# Patient Record
Sex: Female | Born: 1960 | Race: Black or African American | Hispanic: No | Marital: Single | Smoking: Former smoker
Health system: Southern US, Community
[De-identification: ages and names within clinical notes are randomized; demographics above are authoritative.]

## PROBLEM LIST (undated history)

## (undated) DIAGNOSIS — J449 Chronic obstructive pulmonary disease, unspecified: Secondary | ICD-10-CM

## (undated) DIAGNOSIS — M797 Fibromyalgia: Secondary | ICD-10-CM

## (undated) DIAGNOSIS — I10 Essential (primary) hypertension: Secondary | ICD-10-CM

## (undated) DIAGNOSIS — K219 Gastro-esophageal reflux disease without esophagitis: Secondary | ICD-10-CM

## (undated) DIAGNOSIS — M1990 Unspecified osteoarthritis, unspecified site: Secondary | ICD-10-CM

## (undated) DIAGNOSIS — M549 Dorsalgia, unspecified: Secondary | ICD-10-CM

## (undated) DIAGNOSIS — G8929 Other chronic pain: Secondary | ICD-10-CM

## (undated) DIAGNOSIS — G4730 Sleep apnea, unspecified: Secondary | ICD-10-CM

## (undated) DIAGNOSIS — F419 Anxiety disorder, unspecified: Secondary | ICD-10-CM

## (undated) HISTORY — PX: CARPAL TUNNEL RELEASE: SHX101

## (undated) HISTORY — PX: ABDOMINAL HYSTERECTOMY: SHX81

## (undated) HISTORY — PX: KNEE SURGERY: SHX244

## (undated) HISTORY — PX: FOOT SURGERY: SHX648

## (undated) HISTORY — PX: GANGLION CYST EXCISION: SHX1691

---

## 2000-10-04 ENCOUNTER — Ambulatory Visit (HOSPITAL_COMMUNITY): Admission: RE | Admit: 2000-10-04 | Discharge: 2000-10-04 | Payer: Self-pay

## 2000-10-31 ENCOUNTER — Encounter (HOSPITAL_COMMUNITY): Admission: RE | Admit: 2000-10-31 | Discharge: 2000-11-30 | Payer: Self-pay | Admitting: Orthopedic Surgery

## 2000-11-08 ENCOUNTER — Emergency Department (HOSPITAL_COMMUNITY): Admission: EM | Admit: 2000-11-08 | Discharge: 2000-11-08 | Payer: Self-pay | Admitting: Emergency Medicine

## 2000-12-19 ENCOUNTER — Emergency Department (HOSPITAL_COMMUNITY): Admission: EM | Admit: 2000-12-19 | Discharge: 2000-12-20 | Payer: Self-pay | Admitting: Internal Medicine

## 2002-12-09 ENCOUNTER — Emergency Department (HOSPITAL_COMMUNITY): Admission: EM | Admit: 2002-12-09 | Discharge: 2002-12-09 | Payer: Self-pay | Admitting: Emergency Medicine

## 2003-02-22 ENCOUNTER — Emergency Department (HOSPITAL_COMMUNITY): Admission: EM | Admit: 2003-02-22 | Discharge: 2003-02-22 | Payer: Self-pay | Admitting: *Deleted

## 2003-05-19 ENCOUNTER — Ambulatory Visit (HOSPITAL_COMMUNITY): Admission: RE | Admit: 2003-05-19 | Discharge: 2003-05-19 | Payer: Self-pay | Admitting: Family Medicine

## 2003-08-21 ENCOUNTER — Emergency Department (HOSPITAL_COMMUNITY): Admission: EM | Admit: 2003-08-21 | Discharge: 2003-08-21 | Payer: Self-pay | Admitting: Emergency Medicine

## 2004-06-11 ENCOUNTER — Emergency Department (HOSPITAL_COMMUNITY): Admission: EM | Admit: 2004-06-11 | Discharge: 2004-06-12 | Payer: Self-pay | Admitting: *Deleted

## 2004-06-15 ENCOUNTER — Emergency Department (HOSPITAL_COMMUNITY): Admission: EM | Admit: 2004-06-15 | Discharge: 2004-06-15 | Payer: Self-pay | Admitting: Emergency Medicine

## 2004-09-09 ENCOUNTER — Ambulatory Visit (HOSPITAL_COMMUNITY): Admission: RE | Admit: 2004-09-09 | Discharge: 2004-09-09 | Payer: Self-pay | Admitting: Family Medicine

## 2004-10-31 ENCOUNTER — Emergency Department (HOSPITAL_COMMUNITY): Admission: EM | Admit: 2004-10-31 | Discharge: 2004-10-31 | Payer: Self-pay | Admitting: Emergency Medicine

## 2004-12-31 ENCOUNTER — Emergency Department (HOSPITAL_COMMUNITY): Admission: EM | Admit: 2004-12-31 | Discharge: 2004-12-31 | Payer: Self-pay | Admitting: Emergency Medicine

## 2005-03-26 ENCOUNTER — Emergency Department (HOSPITAL_COMMUNITY): Admission: EM | Admit: 2005-03-26 | Discharge: 2005-03-26 | Payer: Self-pay | Admitting: Emergency Medicine

## 2005-03-26 ENCOUNTER — Emergency Department (HOSPITAL_COMMUNITY): Admission: EM | Admit: 2005-03-26 | Discharge: 2005-03-27 | Payer: Self-pay | Admitting: Emergency Medicine

## 2005-03-29 ENCOUNTER — Emergency Department (HOSPITAL_COMMUNITY): Admission: EM | Admit: 2005-03-29 | Discharge: 2005-03-29 | Payer: Self-pay | Admitting: Emergency Medicine

## 2005-05-22 ENCOUNTER — Emergency Department (HOSPITAL_COMMUNITY): Admission: EM | Admit: 2005-05-22 | Discharge: 2005-05-22 | Payer: Self-pay | Admitting: Emergency Medicine

## 2005-06-23 ENCOUNTER — Ambulatory Visit: Payer: Self-pay | Admitting: Family Medicine

## 2005-07-03 ENCOUNTER — Ambulatory Visit (HOSPITAL_COMMUNITY): Admission: RE | Admit: 2005-07-03 | Discharge: 2005-07-03 | Payer: Self-pay | Admitting: Unknown Physician Specialty

## 2005-07-10 ENCOUNTER — Encounter (INDEPENDENT_AMBULATORY_CARE_PROVIDER_SITE_OTHER): Payer: Self-pay | Admitting: Internal Medicine

## 2005-07-10 LAB — CONVERTED CEMR LAB: TSH: 0.928 microintl units/mL

## 2005-07-13 ENCOUNTER — Ambulatory Visit: Payer: Self-pay | Admitting: Family Medicine

## 2005-07-18 ENCOUNTER — Encounter (HOSPITAL_COMMUNITY): Admission: RE | Admit: 2005-07-18 | Discharge: 2005-08-17 | Payer: Self-pay | Admitting: Family Medicine

## 2005-07-21 ENCOUNTER — Ambulatory Visit: Payer: Self-pay | Admitting: Family Medicine

## 2005-07-21 ENCOUNTER — Encounter (INDEPENDENT_AMBULATORY_CARE_PROVIDER_SITE_OTHER): Payer: Self-pay | Admitting: Internal Medicine

## 2005-07-21 ENCOUNTER — Emergency Department (HOSPITAL_COMMUNITY): Admission: EM | Admit: 2005-07-21 | Discharge: 2005-07-21 | Payer: Self-pay | Admitting: Emergency Medicine

## 2005-07-21 LAB — CONVERTED CEMR LAB
RBC count: 5.63 10*6/uL
WBC, blood: 5.8 10*3/uL

## 2005-07-25 ENCOUNTER — Ambulatory Visit: Payer: Self-pay | Admitting: Family Medicine

## 2005-08-01 ENCOUNTER — Emergency Department (HOSPITAL_COMMUNITY): Admission: EM | Admit: 2005-08-01 | Discharge: 2005-08-01 | Payer: Self-pay | Admitting: Emergency Medicine

## 2005-08-11 ENCOUNTER — Ambulatory Visit: Payer: Self-pay | Admitting: Family Medicine

## 2005-08-14 ENCOUNTER — Ambulatory Visit: Payer: Self-pay | Admitting: Orthopedic Surgery

## 2005-08-17 ENCOUNTER — Encounter (INDEPENDENT_AMBULATORY_CARE_PROVIDER_SITE_OTHER): Payer: Self-pay | Admitting: Internal Medicine

## 2005-08-23 ENCOUNTER — Encounter (HOSPITAL_COMMUNITY): Admission: RE | Admit: 2005-08-23 | Discharge: 2005-09-22 | Payer: Self-pay | Admitting: Family Medicine

## 2005-08-24 ENCOUNTER — Ambulatory Visit (HOSPITAL_COMMUNITY): Admission: RE | Admit: 2005-08-24 | Discharge: 2005-08-24 | Payer: Self-pay | Admitting: Orthopedic Surgery

## 2005-08-30 ENCOUNTER — Ambulatory Visit: Payer: Self-pay | Admitting: Family Medicine

## 2005-09-11 ENCOUNTER — Ambulatory Visit: Payer: Self-pay | Admitting: Orthopedic Surgery

## 2005-09-13 ENCOUNTER — Ambulatory Visit: Payer: Self-pay | Admitting: Family Medicine

## 2005-09-25 ENCOUNTER — Encounter (HOSPITAL_COMMUNITY): Admission: RE | Admit: 2005-09-25 | Discharge: 2005-10-25 | Payer: Self-pay | Admitting: Family Medicine

## 2005-09-26 ENCOUNTER — Emergency Department (HOSPITAL_COMMUNITY): Admission: EM | Admit: 2005-09-26 | Discharge: 2005-09-26 | Payer: Self-pay | Admitting: Emergency Medicine

## 2005-09-27 ENCOUNTER — Encounter: Admission: RE | Admit: 2005-09-27 | Discharge: 2005-09-27 | Payer: Self-pay | Admitting: Orthopedic Surgery

## 2005-10-03 ENCOUNTER — Ambulatory Visit: Payer: Self-pay | Admitting: Internal Medicine

## 2005-10-03 LAB — CONVERTED CEMR LAB: aPTT: 30 s

## 2005-10-11 ENCOUNTER — Encounter: Admission: RE | Admit: 2005-10-11 | Discharge: 2005-10-11 | Payer: Self-pay | Admitting: Orthopedic Surgery

## 2005-11-01 ENCOUNTER — Ambulatory Visit: Payer: Self-pay | Admitting: Orthopedic Surgery

## 2005-11-10 ENCOUNTER — Ambulatory Visit (HOSPITAL_COMMUNITY): Admission: RE | Admit: 2005-11-10 | Discharge: 2005-11-10 | Payer: Self-pay | Admitting: Neurosurgery

## 2005-11-21 ENCOUNTER — Ambulatory Visit: Payer: Self-pay | Admitting: Internal Medicine

## 2005-12-05 ENCOUNTER — Ambulatory Visit: Payer: Self-pay | Admitting: Internal Medicine

## 2005-12-05 ENCOUNTER — Other Ambulatory Visit: Admission: RE | Admit: 2005-12-05 | Discharge: 2005-12-05 | Payer: Self-pay | Admitting: Internal Medicine

## 2005-12-11 ENCOUNTER — Ambulatory Visit (HOSPITAL_COMMUNITY): Admission: RE | Admit: 2005-12-11 | Discharge: 2005-12-11 | Payer: Self-pay | Admitting: Internal Medicine

## 2006-01-19 ENCOUNTER — Ambulatory Visit (HOSPITAL_COMMUNITY): Admission: RE | Admit: 2006-01-19 | Discharge: 2006-01-19 | Payer: Self-pay | Admitting: *Deleted

## 2006-01-19 ENCOUNTER — Ambulatory Visit: Payer: Self-pay | Admitting: Internal Medicine

## 2006-01-24 ENCOUNTER — Ambulatory Visit (HOSPITAL_COMMUNITY): Admission: RE | Admit: 2006-01-24 | Discharge: 2006-01-24 | Payer: Self-pay | Admitting: Internal Medicine

## 2006-02-08 ENCOUNTER — Ambulatory Visit: Payer: Self-pay | Admitting: Orthopedic Surgery

## 2006-02-13 ENCOUNTER — Encounter (HOSPITAL_COMMUNITY): Admission: RE | Admit: 2006-02-13 | Discharge: 2006-03-15 | Payer: Self-pay | Admitting: Orthopedic Surgery

## 2006-04-20 ENCOUNTER — Ambulatory Visit: Payer: Self-pay | Admitting: Internal Medicine

## 2006-06-27 ENCOUNTER — Emergency Department (HOSPITAL_COMMUNITY): Admission: EM | Admit: 2006-06-27 | Discharge: 2006-06-27 | Payer: Self-pay | Admitting: Emergency Medicine

## 2006-07-04 ENCOUNTER — Ambulatory Visit: Payer: Self-pay | Admitting: Internal Medicine

## 2006-07-06 ENCOUNTER — Encounter: Payer: Self-pay | Admitting: Internal Medicine

## 2006-07-06 DIAGNOSIS — K589 Irritable bowel syndrome without diarrhea: Secondary | ICD-10-CM | POA: Insufficient documentation

## 2006-07-06 DIAGNOSIS — K219 Gastro-esophageal reflux disease without esophagitis: Secondary | ICD-10-CM | POA: Insufficient documentation

## 2006-07-06 DIAGNOSIS — F329 Major depressive disorder, single episode, unspecified: Secondary | ICD-10-CM

## 2006-07-06 DIAGNOSIS — G43009 Migraine without aura, not intractable, without status migrainosus: Secondary | ICD-10-CM | POA: Insufficient documentation

## 2006-07-06 DIAGNOSIS — N949 Unspecified condition associated with female genital organs and menstrual cycle: Secondary | ICD-10-CM

## 2006-07-06 DIAGNOSIS — M545 Low back pain, unspecified: Secondary | ICD-10-CM | POA: Insufficient documentation

## 2006-07-06 DIAGNOSIS — D649 Anemia, unspecified: Secondary | ICD-10-CM

## 2006-07-06 DIAGNOSIS — N390 Urinary tract infection, site not specified: Secondary | ICD-10-CM

## 2006-07-06 DIAGNOSIS — I10 Essential (primary) hypertension: Secondary | ICD-10-CM | POA: Insufficient documentation

## 2006-07-24 ENCOUNTER — Encounter (HOSPITAL_COMMUNITY): Admission: RE | Admit: 2006-07-24 | Discharge: 2006-08-23 | Payer: Self-pay

## 2006-08-28 ENCOUNTER — Encounter (HOSPITAL_COMMUNITY): Admission: RE | Admit: 2006-08-28 | Discharge: 2006-09-27 | Payer: Self-pay

## 2006-10-08 ENCOUNTER — Encounter (HOSPITAL_COMMUNITY): Admission: RE | Admit: 2006-10-08 | Discharge: 2006-11-09 | Payer: Self-pay | Admitting: Orthopedic Surgery

## 2006-11-15 ENCOUNTER — Emergency Department (HOSPITAL_COMMUNITY): Admission: EM | Admit: 2006-11-15 | Discharge: 2006-11-15 | Payer: Self-pay | Admitting: Emergency Medicine

## 2007-01-29 ENCOUNTER — Encounter (INDEPENDENT_AMBULATORY_CARE_PROVIDER_SITE_OTHER): Payer: Self-pay | Admitting: Internal Medicine

## 2007-06-20 ENCOUNTER — Encounter: Payer: Self-pay | Admitting: Family Medicine

## 2008-06-18 ENCOUNTER — Emergency Department (HOSPITAL_COMMUNITY): Admission: EM | Admit: 2008-06-18 | Discharge: 2008-06-18 | Payer: Self-pay | Admitting: Emergency Medicine

## 2008-08-14 ENCOUNTER — Telehealth: Payer: Self-pay | Admitting: Orthopedic Surgery

## 2008-09-25 ENCOUNTER — Encounter: Payer: Self-pay | Admitting: Orthopedic Surgery

## 2008-10-25 ENCOUNTER — Encounter: Payer: Self-pay | Admitting: Orthopedic Surgery

## 2008-11-09 ENCOUNTER — Emergency Department (HOSPITAL_COMMUNITY): Admission: EM | Admit: 2008-11-09 | Discharge: 2008-11-09 | Payer: Self-pay | Admitting: Emergency Medicine

## 2009-01-07 ENCOUNTER — Emergency Department (HOSPITAL_COMMUNITY): Admission: EM | Admit: 2009-01-07 | Discharge: 2009-01-07 | Payer: Self-pay | Admitting: Emergency Medicine

## 2009-03-01 ENCOUNTER — Emergency Department (HOSPITAL_COMMUNITY): Admission: EM | Admit: 2009-03-01 | Discharge: 2009-03-01 | Payer: Self-pay | Admitting: Emergency Medicine

## 2009-06-21 ENCOUNTER — Encounter (HOSPITAL_COMMUNITY): Admission: RE | Admit: 2009-06-21 | Discharge: 2009-07-21 | Payer: Self-pay | Admitting: Pathology

## 2010-03-15 ENCOUNTER — Emergency Department (HOSPITAL_COMMUNITY): Admission: EM | Admit: 2010-03-15 | Discharge: 2010-03-16 | Payer: Self-pay | Admitting: Emergency Medicine

## 2010-07-10 ENCOUNTER — Encounter: Payer: Self-pay | Admitting: Orthopedic Surgery

## 2010-07-19 NOTE — Letter (Signed)
Summary: Historic Patient File  Historic Patient File   Imported By: Lind Guest 03/16/2010 11:24:37  _____________________________________________________________________  External Attachment:    Type:   Image     Comment:   External Document

## 2010-09-01 LAB — WET PREP, GENITAL: Yeast Wet Prep HPF POC: NONE SEEN

## 2010-09-01 LAB — URINALYSIS, ROUTINE W REFLEX MICROSCOPIC
Glucose, UA: NEGATIVE mg/dL
Ketones, ur: NEGATIVE mg/dL
Nitrite: NEGATIVE
Protein, ur: NEGATIVE mg/dL
Urobilinogen, UA: 0.2 mg/dL (ref 0.0–1.0)

## 2010-09-25 LAB — GC/CHLAMYDIA PROBE AMP, GENITAL
Chlamydia, DNA Probe: NEGATIVE
GC Probe Amp, Genital: NEGATIVE

## 2010-09-25 LAB — URINALYSIS, ROUTINE W REFLEX MICROSCOPIC
Bilirubin Urine: NEGATIVE
Glucose, UA: NEGATIVE mg/dL
Ketones, ur: NEGATIVE mg/dL
Specific Gravity, Urine: 1.02 (ref 1.005–1.030)
pH: 6.5 (ref 5.0–8.0)

## 2010-09-25 LAB — URINE MICROSCOPIC-ADD ON

## 2010-09-25 LAB — URINE CULTURE: Colony Count: NO GROWTH

## 2010-09-25 LAB — WET PREP, GENITAL

## 2012-09-20 ENCOUNTER — Emergency Department (HOSPITAL_COMMUNITY): Payer: Medicaid - Out of State

## 2012-09-20 ENCOUNTER — Encounter (HOSPITAL_COMMUNITY): Payer: Self-pay

## 2012-09-20 ENCOUNTER — Emergency Department (HOSPITAL_COMMUNITY)
Admission: EM | Admit: 2012-09-20 | Discharge: 2012-09-20 | Disposition: A | Discharge status: Home / Self Care | Payer: Medicaid - Out of State | Attending: Emergency Medicine | Admitting: Emergency Medicine

## 2012-09-20 DIAGNOSIS — M542 Cervicalgia: Secondary | ICD-10-CM

## 2012-09-20 DIAGNOSIS — Z8739 Personal history of other diseases of the musculoskeletal system and connective tissue: Secondary | ICD-10-CM | POA: Insufficient documentation

## 2012-09-20 DIAGNOSIS — M25519 Pain in unspecified shoulder: Secondary | ICD-10-CM | POA: Insufficient documentation

## 2012-09-20 DIAGNOSIS — S0990XA Unspecified injury of head, initial encounter: Secondary | ICD-10-CM | POA: Insufficient documentation

## 2012-09-20 DIAGNOSIS — S39012A Strain of muscle, fascia and tendon of lower back, initial encounter: Secondary | ICD-10-CM

## 2012-09-20 DIAGNOSIS — I10 Essential (primary) hypertension: Secondary | ICD-10-CM | POA: Insufficient documentation

## 2012-09-20 DIAGNOSIS — K219 Gastro-esophageal reflux disease without esophagitis: Secondary | ICD-10-CM | POA: Insufficient documentation

## 2012-09-20 DIAGNOSIS — S139XXA Sprain of joints and ligaments of unspecified parts of neck, initial encounter: Secondary | ICD-10-CM | POA: Insufficient documentation

## 2012-09-20 DIAGNOSIS — R51 Headache: Secondary | ICD-10-CM

## 2012-09-20 DIAGNOSIS — S335XXA Sprain of ligaments of lumbar spine, initial encounter: Secondary | ICD-10-CM | POA: Insufficient documentation

## 2012-09-20 DIAGNOSIS — G8929 Other chronic pain: Secondary | ICD-10-CM

## 2012-09-20 DIAGNOSIS — S0993XA Unspecified injury of face, initial encounter: Secondary | ICD-10-CM | POA: Insufficient documentation

## 2012-09-20 DIAGNOSIS — S161XXA Strain of muscle, fascia and tendon at neck level, initial encounter: Secondary | ICD-10-CM

## 2012-09-20 HISTORY — DX: Essential (primary) hypertension: I10

## 2012-09-20 HISTORY — DX: Unspecified osteoarthritis, unspecified site: M19.90

## 2012-09-20 HISTORY — DX: Gastro-esophageal reflux disease without esophagitis: K21.9

## 2012-09-20 MED ORDER — HYDROCODONE-ACETAMINOPHEN 5-325 MG PO TABS
2.0000 | ORAL_TABLET | Freq: Once | ORAL | Status: AC
  Administered 2012-09-20: 2 via ORAL
  Filled 2012-09-20: qty 2

## 2012-09-20 MED ORDER — DIAZEPAM 5 MG PO TABS
10.0000 mg | ORAL_TABLET | Freq: Once | ORAL | Status: AC
  Administered 2012-09-20: 10 mg via ORAL
  Filled 2012-09-20: qty 2

## 2012-09-20 MED ORDER — HYDROCODONE-ACETAMINOPHEN 5-325 MG PO TABS: 1.0000 | ORAL_TABLET | ORAL | Status: DC | PRN

## 2012-09-20 MED ORDER — METHOCARBAMOL 500 MG PO TABS: 500.0000 mg | ORAL_TABLET | Freq: Two times a day (BID) | ORAL | Status: DC

## 2012-09-20 MED ORDER — NAPROXEN 500 MG PO TABS: 500.0000 mg | ORAL_TABLET | Freq: Two times a day (BID) | ORAL | Status: DC
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
## 2012-09-20 NOTE — ED Notes (Signed)
Pt ambulated with assistance of cane (per norm at home.)

## 2012-09-20 NOTE — ED Notes (Signed)
PA removed C collar
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
## 2012-09-21 NOTE — ED Provider Notes (Signed)
  Medical screening examination/treatment/procedure(s) were performed by non-physician practitioner and as supervising physician I was immediately available for consultation/collaboration.    Gerhard Munch, MD 09/21/12 0030

## 2012-10-24 ENCOUNTER — Emergency Department (HOSPITAL_COMMUNITY)
Admission: EM | Admit: 2012-10-24 | Discharge: 2012-10-24 | Disposition: A | Discharge status: Home / Self Care | Payer: Medicaid - Out of State | Attending: Emergency Medicine | Admitting: Emergency Medicine

## 2012-10-24 ENCOUNTER — Encounter (HOSPITAL_COMMUNITY): Payer: Self-pay

## 2012-10-24 DIAGNOSIS — J4489 Other specified chronic obstructive pulmonary disease: Secondary | ICD-10-CM | POA: Insufficient documentation

## 2012-10-24 DIAGNOSIS — S61409A Unspecified open wound of unspecified hand, initial encounter: Secondary | ICD-10-CM | POA: Insufficient documentation

## 2012-10-24 DIAGNOSIS — I10 Essential (primary) hypertension: Secondary | ICD-10-CM | POA: Insufficient documentation

## 2012-10-24 DIAGNOSIS — Y929 Unspecified place or not applicable: Secondary | ICD-10-CM | POA: Insufficient documentation

## 2012-10-24 DIAGNOSIS — Z8739 Personal history of other diseases of the musculoskeletal system and connective tissue: Secondary | ICD-10-CM | POA: Insufficient documentation

## 2012-10-24 DIAGNOSIS — W278XXA Contact with other nonpowered hand tool, initial encounter: Secondary | ICD-10-CM | POA: Insufficient documentation

## 2012-10-24 DIAGNOSIS — T148XXA Other injury of unspecified body region, initial encounter: Secondary | ICD-10-CM

## 2012-10-24 DIAGNOSIS — F17200 Nicotine dependence, unspecified, uncomplicated: Secondary | ICD-10-CM | POA: Insufficient documentation

## 2012-10-24 DIAGNOSIS — Y939 Activity, unspecified: Secondary | ICD-10-CM | POA: Insufficient documentation

## 2012-10-24 DIAGNOSIS — Z79899 Other long term (current) drug therapy: Secondary | ICD-10-CM | POA: Insufficient documentation

## 2012-10-24 DIAGNOSIS — J449 Chronic obstructive pulmonary disease, unspecified: Secondary | ICD-10-CM | POA: Insufficient documentation

## 2012-10-24 DIAGNOSIS — Z8719 Personal history of other diseases of the digestive system: Secondary | ICD-10-CM | POA: Insufficient documentation

## 2012-10-24 HISTORY — DX: Chronic obstructive pulmonary disease, unspecified: J44.9

## 2012-10-24 MED ORDER — IBUPROFEN 800 MG PO TABS
800.0000 mg | ORAL_TABLET | Freq: Once | ORAL | Status: AC
  Administered 2012-10-24: 800 mg via ORAL
  Filled 2012-10-24: qty 1

## 2012-10-24 MED ORDER — AMOXICILLIN-POT CLAVULANATE 500-125 MG PO TABS
1.0000 | ORAL_TABLET | Freq: Once | ORAL | Status: AC
  Administered 2012-10-24: 500 mg via ORAL
  Filled 2012-10-24: qty 1

## 2012-10-24 MED ORDER — BACITRACIN ZINC 500 UNIT/GM EX OINT
TOPICAL_OINTMENT | CUTANEOUS | Status: AC
  Filled 2012-10-24: qty 0.9

## 2012-10-24 MED ORDER — AMOXICILLIN-POT CLAVULANATE 500-125 MG PO TABS: 1.0000 | ORAL_TABLET | Freq: Three times a day (TID) | ORAL | Status: DC

## 2012-10-24 NOTE — ED Notes (Signed)
Pt has 2 puncture wounds to left arm and hand, states she was outside and she stepped on a hoe that was lying on the ground and states it sprung up and caught her in the left arm and hand.

## 2012-10-24 NOTE — ED Provider Notes (Signed)
History     CSN: [id number]  Arrival date & time 10/24/12  0050   First MD Initiated Contact with Patient 10/24/12 0224      Chief Complaint  Patient presents with  . Extremity Laceration    (Consider location/radiation/quality/duration/timing/severity/associated sxs/prior treatment) HPI HPI Comments: Vicki Dean is a 52 y.o. female who presents to the Emergency Department complaining of two puncture wounds to her left hand sustained as she stepped on a hoe that was lying on the ground and it came up hitting her in the left hand and arm. She has two lacerations, one in the base of her thumb and one on her left arm.   Past Medical History  Diagnosis Date  . Hypertension   . GERD (gastroesophageal reflux disease)   . Arthritis     possibly her right shoulder.  Marland Kitchen COPD (chronic obstructive pulmonary disease)     History reviewed. No pertinent past surgical history.  No family history on file.  History  Substance Use Topics  . Smoking status: Current Some Day Smoker  . Smokeless tobacco: Not on file  . Alcohol Use: Yes    OB History   Grav Para Term Preterm Abortions TAB SAB Ect Mult Living                  Review of Systems  Constitutional: Negative for fever.       10 Systems reviewed and are negative for acute change except as noted in the HPI.  HENT: Negative for congestion.   Eyes: Negative for discharge and redness.  Respiratory: Negative for cough and shortness of breath.   Cardiovascular: Negative for chest pain.  Gastrointestinal: Negative for vomiting and abdominal pain.  Musculoskeletal: Negative for back pain.  Skin: Negative for rash.       Two puncture wounds, one to base of thumb, one to forearm  Neurological: Negative for syncope, numbness and headaches.  Psychiatric/Behavioral:       No behavior change.    Allergies  Review of patient's allergies indicates not on file.  Home Medications   Current Outpatient Rx  Name  Route  Sig  Dispense   Refill  . cyclobenzaprine (FLEXERIL) 10 MG tablet   Oral   Take 10 mg by mouth 3 (three) times daily as needed for muscle spasms.         . methocarbamol (ROBAXIN) 500 MG tablet   Oral   Take 1 tablet (500 mg total) by mouth 2 (two) times daily.   20 tablet   0   . metoprolol-hydrochlorothiazide (LOPRESSOR HCT) 50-25 MG per tablet   Oral   Take 1 tablet by mouth daily.         . naproxen (NAPROSYN) 500 MG tablet   Oral   Take 1 tablet (500 mg total) by mouth 2 (two) times daily with a meal.   30 tablet   0   . trazodone (DESYREL) 300 MG tablet   Oral   Take 300 mg by mouth at bedtime.         Marland Kitchen HYDROcodone-acetaminophen (NORCO/VICODIN) 5-325 MG per tablet   Oral   Take 1-2 tablets by mouth every 4 (four) hours as needed for pain.   20 tablet   0     BP 136/82  Pulse 102  Temp(Src) 97.8 F (36.6 C) (Oral)  Resp 20  Ht 5\' 7"  (1.702 m)  Wt 188 lb (85.276 kg)  BMI 29.44 kg/m2  SpO2 96%  Physical  Exam  Nursing note and vitals reviewed. Constitutional: She appears well-developed and well-nourished.  Awake, alert, nontoxic appearance.  HENT:  Head: Normocephalic and atraumatic.  Eyes: EOM are normal. Pupils are equal, round, and reactive to light.  Neck: Neck supple.  Cardiovascular: Normal rate and intact distal pulses.   Pulmonary/Chest: Effort normal and breath sounds normal. She exhibits no tenderness.  Abdominal: Soft. Bowel sounds are normal. There is no tenderness. There is no rebound.  Musculoskeletal: She exhibits no tenderness.  Baseline ROM, no obvious new focal weakness.  Neurological:  Mental status and motor strength appears baseline for patient and situation.  Skin: No rash noted.  Puncture wound to base of left thumb. Small laceration to left forearm. Neither one can be sutured.  Psychiatric: She has a normal mood and affect.    ED Course  Procedures (including critical care time)  Medications  ibuprofen (ADVIL,MOTRIN) tablet 800 mg  (not administered)  amoxicillin-clavulanate (AUGMENTIN) 500-125 MG per tablet 500 mg (not administered)  bacitracin 500 UNIT/GM ointment (not administered)     MDM  Patient with two puncture wounds to left hand and arm from a hoe she stepped on while outside. Wounds have been cleaned and dressed. Pt stable in ED with no significant deterioration in condition.The patient appears reasonably screened and/or stabilized for discharge and I doubt any other medical condition or other [facility] requiring further screening, evaluation, or treatment in the ED at this time prior to discharge.  MDM Reviewed: nursing note and vitals           Nicoletta Dress. Colon Branch, MD 10/24/12 [phone]

## 2013-04-28 ENCOUNTER — Telehealth: Payer: Self-pay | Admitting: Orthopedic Surgery

## 2013-04-28 NOTE — Telephone Encounter (Signed)
Patient called to request appointment for knee problem; last seen here by Dr. Romeo Apple 02/13/06.  Forms have been completed for patient per authorization requests after this date for disability determination, however, no other recent visits on file.  Patient relates that she has been treating with [employer] for the knee problem, and has requested to see a doctor outside the [place] system, and said has the voucher authorizing the visit.  I relayed process of requesting medical records and films, with the appropriate release form, to be released to herself, so that she has the copy to bring to the office for Dr. Romeo Apple to review and advise.  Her ph# is [phone].  States she will contact them accordingly.

## 2013-04-30 NOTE — Telephone Encounter (Signed)
Patient will contact our office regarding records upon receipt for Dr. Romeo Apple to review.

## 2017-02-12 ENCOUNTER — Emergency Department (HOSPITAL_COMMUNITY): Payer: Non-veteran care

## 2017-02-12 ENCOUNTER — Emergency Department (HOSPITAL_COMMUNITY)
Admission: EM | Admit: 2017-02-12 | Discharge: 2017-02-12 | Disposition: A | Discharge status: Home / Self Care | Payer: Non-veteran care | Attending: Emergency Medicine | Admitting: Emergency Medicine

## 2017-02-12 ENCOUNTER — Encounter (HOSPITAL_COMMUNITY): Payer: Self-pay | Admitting: *Deleted

## 2017-02-12 DIAGNOSIS — Y929 Unspecified place or not applicable: Secondary | ICD-10-CM | POA: Insufficient documentation

## 2017-02-12 DIAGNOSIS — F17200 Nicotine dependence, unspecified, uncomplicated: Secondary | ICD-10-CM | POA: Diagnosis not present

## 2017-02-12 DIAGNOSIS — Y999 Unspecified external cause status: Secondary | ICD-10-CM | POA: Diagnosis not present

## 2017-02-12 DIAGNOSIS — W208XXA Other cause of strike by thrown, projected or falling object, initial encounter: Secondary | ICD-10-CM | POA: Diagnosis not present

## 2017-02-12 DIAGNOSIS — I10 Essential (primary) hypertension: Secondary | ICD-10-CM | POA: Diagnosis not present

## 2017-02-12 DIAGNOSIS — Y9389 Activity, other specified: Secondary | ICD-10-CM | POA: Insufficient documentation

## 2017-02-12 DIAGNOSIS — S99921A Unspecified injury of right foot, initial encounter: Secondary | ICD-10-CM | POA: Diagnosis present

## 2017-02-12 DIAGNOSIS — J449 Chronic obstructive pulmonary disease, unspecified: Secondary | ICD-10-CM | POA: Insufficient documentation

## 2017-02-12 DIAGNOSIS — S92424A Nondisplaced fracture of distal phalanx of right great toe, initial encounter for closed fracture: Secondary | ICD-10-CM | POA: Insufficient documentation

## 2017-02-12 MED ORDER — IBUPROFEN 800 MG PO TABS: 800.0000 mg | ORAL_TABLET | Freq: Three times a day (TID) | ORAL | 0 refills | Status: DC

## 2017-02-12 MED ORDER — TRAMADOL HCL 50 MG PO TABS: 50.0000 mg | ORAL_TABLET | Freq: Four times a day (QID) | ORAL | 0 refills | Status: DC | PRN

## 2017-02-12 MED ORDER — OXYCODONE-ACETAMINOPHEN 5-325 MG PO TABS
1.0000 | ORAL_TABLET | Freq: Once | ORAL | Status: AC
  Administered 2017-02-12: 1 via ORAL
  Filled 2017-02-12: qty 1

## 2017-02-12 NOTE — ED Provider Notes (Signed)
[facility]-EMERGENCY DEPT Provider Note   CSN: [id number] Arrival date & time: 02/12/17  0039     History   Chief Complaint Chief Complaint  Patient presents with  . Toe Pain    HPI Vicki Dean is a 56 y.o. female.  Patient presents to the emergency department for evaluation of injury to right great toe. Patient reports that she either dropped her suitcase on her toe or ran it over with the suitcase around 10 or 11 today. She reports that the toe has been tender, swollen all day. It hurts when she tries to put weight on it.      Past Medical History:  Diagnosis Date  . Arthritis    possibly her right shoulder.  Marland Kitchen COPD (chronic obstructive pulmonary disease) (HCC)   . GERD (gastroesophageal reflux disease)   . Hypertension     Patient Active Problem List   Diagnosis Date Noted  . ANEMIA-NOS 07/06/2006  . DEPRESSION 07/06/2006  . COMMON MIGRAINE 07/06/2006  . HYPERTENSION 07/06/2006  . GERD 07/06/2006  . IRRITABLE BOWEL SYNDROME 07/06/2006  . INFECTION, URINARY TRACT NOS 07/06/2006  . DYSFUNCTIONAL UTERINE BLEEDING 07/06/2006  . LOW BACK PAIN 07/06/2006    Past Surgical History:  Procedure Laterality Date  . ABDOMINAL HYSTERECTOMY    . CARPAL TUNNEL RELEASE    . FOOT SURGERY    . GANGLION CYST EXCISION    . KNEE SURGERY      OB History    No data available       Home Medications    Prior to Admission medications   Medication Sig Start Date End Date Taking? Authorizing Provider  ibuprofen (ADVIL,MOTRIN) 800 MG tablet Take 1 tablet (800 mg total) by mouth 3 (three) times daily. 02/12/17   Gilda Crease, MD  traMADol (ULTRAM) 50 MG tablet Take 1 tablet (50 mg total) by mouth every 6 (six) hours as needed. 02/12/17   Gilda Crease, MD    Family History No family history on file.  Social History Social History  Substance Use Topics  . Smoking status: Current Some Day Smoker  . Smokeless tobacco: Never Used  . Alcohol use Yes      Allergies   Patient has no allergy information on record.   Review of Systems Review of Systems  Musculoskeletal:       Toe pain  Skin: Negative for wound.     Physical Exam Updated Vital Signs BP (!) 145/79   Pulse 66   Temp 98.2 F (36.8 C) (Oral)   Resp 16   Ht 5\' 7"  (1.702 m)   Wt 95.3 kg (210 lb)   SpO2 100%   BMI 32.89 kg/m   Physical Exam  Constitutional: She is oriented to person, place, and time. She appears well-developed and well-nourished.  HENT:  Head: Atraumatic.  Eyes: Pupils are equal, round, and reactive to light.  Pulmonary/Chest: Effort normal.  Musculoskeletal: Normal range of motion.       Right foot: There is tenderness (great toe) and swelling. There is normal capillary refill and no deformity.       Feet:  Neurological: She is alert and oriented to person, place, and time.  Skin: Ecchymosis noted. No laceration noted.     ED Treatments / Results  Labs (all labs ordered are listed, but only abnormal results are displayed) Labs Reviewed - No data to display  EKG  EKG Interpretation None       Radiology Dg Toe Great Right  Result Date: 02/12/2017 CLINICAL DATA:  Crush injury. Struck great toe against a suitcase today. Pain and bruising. EXAM: RIGHT GREAT TOE COMPARISON:  None. FINDINGS: Transverse nondisplaced fracture of the great toe distal phalanx. No intra-articular extension. No additional acute fracture. There is soft tissue edema about the digit. Postsurgical change of the fifth metatarsal and first tarsal metatarsal joint are partially included. IMPRESSION: Acute transverse nondisplaced great toe distal phalanx fracture. Electronically Signed   By: Rubye Oaks M.D.   On: 02/12/2017 01:16    Procedures Procedures (including critical care time)  Medications Ordered in ED Medications - No data to display   Initial Impression / Assessment and Plan / ED Course  I have reviewed the triage vital signs and the nursing  notes.  Pertinent labs & imaging results that were available during my care of the patient were reviewed by me and considered in my medical decision making (see chart for details).     Patient presents with injury to right great toe. X-ray shows fracture. Patient provided analgesia, postop splint, follow-up with orthopedics.  Final Clinical Impressions(s) / ED Diagnoses   Final diagnoses:  Closed nondisplaced fracture of distal phalanx of right great toe, initial encounter    New Prescriptions New Prescriptions   IBUPROFEN (ADVIL,MOTRIN) 800 MG TABLET    Take 1 tablet (800 mg total) by mouth 3 (three) times daily.   TRAMADOL (ULTRAM) 50 MG TABLET    Take 1 tablet (50 mg total) by mouth every 6 (six) hours as needed.     Gilda Crease, MD 02/12/17 [phone]

## 2017-02-12 NOTE — ED Triage Notes (Signed)
Pt hit right great toe against a suit case today, c/o pain to toe area, bruising noted,

## 2017-04-13 ENCOUNTER — Encounter: Payer: Self-pay | Admitting: Orthopaedic Surgery

## 2017-04-16 ENCOUNTER — Other Ambulatory Visit (HOSPITAL_COMMUNITY): Payer: Self-pay | Admitting: Nurse Practitioner

## 2017-04-16 DIAGNOSIS — Z1231 Encounter for screening mammogram for malignant neoplasm of breast: Secondary | ICD-10-CM

## 2017-04-23 ENCOUNTER — Ambulatory Visit (HOSPITAL_COMMUNITY): Payer: Non-veteran care

## 2017-05-08 ENCOUNTER — Ambulatory Visit: Payer: Non-veteran care | Admitting: Family Medicine

## 2017-06-07 ENCOUNTER — Encounter: Payer: Self-pay | Admitting: Internal Medicine

## 2017-06-28 ENCOUNTER — Emergency Department (HOSPITAL_COMMUNITY)
Admission: EM | Admit: 2017-06-28 | Discharge: 2017-06-28 | Disposition: A | Discharge status: Home / Self Care | Payer: Non-veteran care | Attending: Emergency Medicine | Admitting: Emergency Medicine

## 2017-06-28 ENCOUNTER — Encounter (HOSPITAL_COMMUNITY): Payer: Self-pay

## 2017-06-28 ENCOUNTER — Other Ambulatory Visit: Payer: Self-pay

## 2017-06-28 DIAGNOSIS — J449 Chronic obstructive pulmonary disease, unspecified: Secondary | ICD-10-CM | POA: Insufficient documentation

## 2017-06-28 DIAGNOSIS — Z202 Contact with and (suspected) exposure to infections with a predominantly sexual mode of transmission: Secondary | ICD-10-CM | POA: Diagnosis not present

## 2017-06-28 DIAGNOSIS — I10 Essential (primary) hypertension: Secondary | ICD-10-CM | POA: Insufficient documentation

## 2017-06-28 DIAGNOSIS — N898 Other specified noninflammatory disorders of vagina: Secondary | ICD-10-CM | POA: Insufficient documentation

## 2017-06-28 DIAGNOSIS — Z79899 Other long term (current) drug therapy: Secondary | ICD-10-CM | POA: Insufficient documentation

## 2017-06-28 DIAGNOSIS — F17200 Nicotine dependence, unspecified, uncomplicated: Secondary | ICD-10-CM | POA: Diagnosis not present

## 2017-06-28 HISTORY — DX: Other chronic pain: G89.29

## 2017-06-28 HISTORY — DX: Dorsalgia, unspecified: M54.9

## 2017-06-28 LAB — URINALYSIS, ROUTINE W REFLEX MICROSCOPIC
Bilirubin Urine: NEGATIVE
Glucose, UA: NEGATIVE mg/dL
Hgb urine dipstick: NEGATIVE
Ketones, ur: NEGATIVE mg/dL
Nitrite: NEGATIVE
Protein, ur: NEGATIVE mg/dL
Specific Gravity, Urine: 1.031 — ABNORMAL HIGH (ref 1.005–1.030)
pH: 5 (ref 5.0–8.0)

## 2017-06-28 LAB — WET PREP, GENITAL
SPERM: NONE SEEN
TRICH WET PREP: NONE SEEN
Yeast Wet Prep HPF POC: NONE SEEN

## 2017-06-28 MED ORDER — AZITHROMYCIN 250 MG PO TABS
1000.0000 mg | ORAL_TABLET | Freq: Once | ORAL | Status: AC
  Administered 2017-06-28: 1000 mg via ORAL
  Filled 2017-06-28: qty 4

## 2017-06-28 MED ORDER — CEFTRIAXONE SODIUM 250 MG IJ SOLR
250.0000 mg | Freq: Once | INTRAMUSCULAR | Status: AC
  Administered 2017-06-28: 250 mg via INTRAMUSCULAR
  Filled 2017-06-28: qty 250

## 2017-06-28 MED ORDER — LIDOCAINE HCL (PF) 1 % IJ SOLN
INTRAMUSCULAR | Status: AC
  Administered 2017-06-28: 0.9 mL
  Filled 2017-06-28: qty 2

## 2017-06-28 MED ORDER — HYDROCODONE-ACETAMINOPHEN 5-325 MG PO TABS
1.0000 | ORAL_TABLET | Freq: Once | ORAL | Status: AC
Start: 2017-06-28 — End: 2017-06-28
  Administered 2017-06-28: 1 via ORAL
  Filled 2017-06-28: qty 1

## 2017-06-28 NOTE — ED Triage Notes (Signed)
Patient reports of unprotected sex and wants to be check for STD. Reports dysuria, lower back pain and vaginal odor.

## 2017-06-28 NOTE — ED Provider Notes (Signed)
[facility] EMERGENCY DEPARTMENT Provider Note   CSN: [id number] Arrival date & time: 06/28/17  0750     History   Chief Complaint Chief Complaint  Patient presents with  . SEXUALLY TRANSMITTED DISEASE    HPI Vicki Dean is a 57 y.o. female.  HPI   Vicki Dean is a 57 y.o. female who presents to the Emergency Department requesting evaluation for possible STD.  She states that she had unprotected intercourse with a new female partner 2 days ago and he contacted her telling her that she needed to be "checked"  She reports noticing some irritation with urination and urine appears darker than usual.  She also complains of increased vaginal odor.  She denies abdominal pain, fever, chills, vaginal bleeding or discharge. Also complains of diffuse lower back pain which is chronic and she denies new or changing back pain.  No lower extremities symptoms or urinary or bowel incontinence or retention.  She has hx of partial hysterectomy.   Past Medical History:  Diagnosis Date  . Arthritis    possibly her right shoulder.  . Chronic back pain   . COPD (chronic obstructive pulmonary disease) (HCC)   . GERD (gastroesophageal reflux disease)   . Hypertension     Patient Active Problem List   Diagnosis Date Noted  . ANEMIA-NOS 07/06/2006  . DEPRESSION 07/06/2006  . COMMON MIGRAINE 07/06/2006  . HYPERTENSION 07/06/2006  . GERD 07/06/2006  . IRRITABLE BOWEL SYNDROME 07/06/2006  . INFECTION, URINARY TRACT NOS 07/06/2006  . DYSFUNCTIONAL UTERINE BLEEDING 07/06/2006  . LOW BACK PAIN 07/06/2006    Past Surgical History:  Procedure Laterality Date  . ABDOMINAL HYSTERECTOMY    . CARPAL TUNNEL RELEASE    . FOOT SURGERY    . GANGLION CYST EXCISION    . KNEE SURGERY      OB History    No data available       Home Medications    Prior to Admission medications   Medication Sig Start Date End Date Taking? Authorizing Provider  ibuprofen (ADVIL,MOTRIN) 800 MG tablet Take 1  tablet (800 mg total) by mouth 3 (three) times daily. 02/12/17   Gilda Crease, MD  traMADol (ULTRAM) 50 MG tablet Take 1 tablet (50 mg total) by mouth every 6 (six) hours as needed. 02/12/17   Gilda Crease, MD    Family History No family history on file.  Social History Social History   Tobacco Use  . Smoking status: Current Some Day Smoker  . Smokeless tobacco: Never Used  Substance Use Topics  . Alcohol use: Yes  . Drug use: No     Allergies   Lisinopril and Omeprazole   Review of Systems Review of Systems  Constitutional: Negative for appetite change, chills and fever.  Respiratory: Negative for shortness of breath.   Cardiovascular: Negative for chest pain.  Gastrointestinal: Negative for abdominal pain, blood in stool, nausea and vomiting.  Genitourinary: Positive for dysuria. Negative for decreased urine volume, difficulty urinating, flank pain, genital sores, vaginal bleeding and vaginal discharge.       Vaginal odor.    Musculoskeletal: Positive for back pain.  Skin: Negative for color change and rash.  Neurological: Negative for dizziness, weakness and numbness.  Hematological: Negative for adenopathy.  All other systems reviewed and are negative.    Physical Exam Updated Vital Signs BP (!) 160/86 (BP Location: Right Arm)   Pulse (!) 57   Temp 97.9 F (36.6 C) (Oral)  Resp 18   Ht 5\' 7"  (1.702 m)   Wt 102.1 kg (225 lb)   SpO2 99%   BMI 35.24 kg/m   Physical Exam  Constitutional: She is oriented to person, place, and time. She appears well-developed and well-nourished. No distress.  HENT:  Head: Normocephalic and atraumatic.  Mouth/Throat: Oropharynx is clear and moist.  Cardiovascular: Normal rate, regular rhythm and intact distal pulses.  No murmur heard. Pulmonary/Chest: Effort normal and breath sounds normal. No respiratory distress.  Abdominal: Soft. Normal appearance and bowel sounds are normal. She exhibits no distension  and no mass. There is no rebound, no guarding and no CVA tenderness.  Genitourinary: There is no tenderness or lesion on the right labia. There is no tenderness or lesion on the left labia. Right adnexum displays no mass and no tenderness. Left adnexum displays no mass and no tenderness. No bleeding in the vagina. No foreign body in the vagina.  Genitourinary Comments: Exam chaperoned.  No adnexal tenderness or masses.  Scant amt of yellow vaginal discharge present.  Pt is s/p hysterectomy.    Musculoskeletal: Normal range of motion. She exhibits no edema.  Diffuse ttp of the lower bilateral lumbar paraspinal muscles.  5/5 strength of the BLE's.    Neurological: She is alert and oriented to person, place, and time. A sensory deficit is present. She exhibits normal muscle tone. Coordination normal.  Skin: Skin is warm and dry. Capillary refill takes less than 2 seconds.  Psychiatric: She has a normal mood and affect.  Nursing note and vitals reviewed.    ED Treatments / Results  Labs (all labs ordered are listed, but only abnormal results are displayed) Labs Reviewed  URINALYSIS, ROUTINE W REFLEX MICROSCOPIC - Abnormal; Notable for the following components:      Result Value   APPearance HAZY (*)    Specific Gravity, Urine 1.031 (*)    Leukocytes, UA SMALL (*)    Bacteria, UA RARE (*)    Squamous Epithelial / LPF 6-30 (*)    All other components within normal limits  WET PREP, GENITAL  URINE CULTURE  RPR  HIV ANTIBODY (ROUTINE TESTING)  GC/CHLAMYDIA PROBE AMP ([place]) NOT AT [facility]    EKG  EKG Interpretation None       Radiology No results found.  Procedures Procedures (including critical care time)  Medications Ordered in ED Medications - No data to display   Initial Impression / Assessment and Plan / ED Course  I have reviewed the triage vital signs and the nursing notes.  Pertinent labs & imaging results that were available during my care of the patient were  reviewed by me and considered in my medical decision making (see chart for details).     Patient with recent unprotected intercourse with a new partner.  No abdominal tenderness no concerning symptoms for TOA or PID.  Afebrile and well-appearing.  Treated here with IM Rocephin and p.o. Zithromax, cultures are pending.  Also low back pain that is likely acute on chronic.  No focal neuro deficits on exam.  She ambulates with a steady gait.  She appears stable for discharge home agrees to treatment plan and close PCP follow-up.  Return precautions were discussed.   Final Clinical Impressions(s) / ED Diagnoses   Final diagnoses:  Possible exposure to STD    ED Discharge Orders    None       Rosey Bathriplett, Sherlon Nied, PA-C 06/28/17 2054    Raeford RazorKohut, Stephen, MD 06/29/17 [phone]

## 2017-06-28 NOTE — Discharge Instructions (Signed)
Follow-up with your GYN doctor or PCP if needed.  Someone from the hospital will contact you if any of your remaining test results are positive.

## 2017-06-29 LAB — GC/CHLAMYDIA PROBE AMP (~~LOC~~) NOT AT ARMC
Chlamydia: NEGATIVE
Neisseria Gonorrhea: NEGATIVE

## 2017-06-29 LAB — URINE CULTURE

## 2017-06-29 LAB — RPR: RPR Ser Ql: NONREACTIVE

## 2017-06-30 LAB — HIV ANTIBODY (ROUTINE TESTING W REFLEX): HIV Screen 4th Generation wRfx: NONREACTIVE

## 2017-07-25 ENCOUNTER — Encounter (HOSPITAL_COMMUNITY): Payer: Self-pay | Admitting: Emergency Medicine

## 2017-07-25 ENCOUNTER — Emergency Department (HOSPITAL_COMMUNITY)
Admission: EM | Admit: 2017-07-25 | Discharge: 2017-07-25 | Disposition: A | Discharge status: Home / Self Care | Payer: Non-veteran care | Attending: Emergency Medicine | Admitting: Emergency Medicine

## 2017-07-25 ENCOUNTER — Other Ambulatory Visit (HOSPITAL_COMMUNITY): Payer: Self-pay | Admitting: Nurse Practitioner

## 2017-07-25 ENCOUNTER — Other Ambulatory Visit: Payer: Self-pay

## 2017-07-25 DIAGNOSIS — M25551 Pain in right hip: Secondary | ICD-10-CM

## 2017-07-25 DIAGNOSIS — J449 Chronic obstructive pulmonary disease, unspecified: Secondary | ICD-10-CM | POA: Insufficient documentation

## 2017-07-25 DIAGNOSIS — Z79899 Other long term (current) drug therapy: Secondary | ICD-10-CM | POA: Diagnosis not present

## 2017-07-25 DIAGNOSIS — I10 Essential (primary) hypertension: Secondary | ICD-10-CM | POA: Insufficient documentation

## 2017-07-25 DIAGNOSIS — F17210 Nicotine dependence, cigarettes, uncomplicated: Secondary | ICD-10-CM | POA: Diagnosis not present

## 2017-07-25 DIAGNOSIS — Z1231 Encounter for screening mammogram for malignant neoplasm of breast: Secondary | ICD-10-CM

## 2017-07-25 DIAGNOSIS — M5431 Sciatica, right side: Secondary | ICD-10-CM | POA: Insufficient documentation

## 2017-07-25 DIAGNOSIS — M5430 Sciatica, unspecified side: Secondary | ICD-10-CM

## 2017-07-25 MED ORDER — IBUPROFEN 600 MG PO TABS: 600.0000 mg | ORAL_TABLET | Freq: Four times a day (QID) | ORAL | 0 refills | Status: DC

## 2017-07-25 MED ORDER — DEXAMETHASONE 4 MG PO TABS: 4.0000 mg | ORAL_TABLET | Freq: Two times a day (BID) | ORAL | 0 refills | Status: DC

## 2017-07-25 MED ORDER — CYCLOBENZAPRINE HCL 10 MG PO TABS: 10.0000 mg | ORAL_TABLET | Freq: Three times a day (TID) | ORAL | 0 refills | Status: DC

## 2017-07-25 NOTE — ED Triage Notes (Signed)
Patient c/o R hip pain that started "a few days ago." Patient has hx of same, no known injury.

## 2017-07-25 NOTE — Discharge Instructions (Signed)
Your blood pressure is slightly elevated at 59/92, otherwise your vital signs are within normal limits.  Your examination shows lower back pain, as well as right hip area pain.  Review of your previous x-rays shows degenerative disc disease and arthritis involving your back.  Please see the orthopedic specialist listed above, or the specialist at the [facility] concerning your back and your right hip.  Please use Flexeril 3 times daily, ibuprofen with breakfast, lunch, dinner, and at bedtime.  Use Decadron 2 times daily with food.

## 2017-07-25 NOTE — ED Provider Notes (Signed)
[facility] EMERGENCY DEPARTMENT Provider Note   CSN: [id number] Arrival date & time: 07/25/17  1142     History   Chief Complaint Chief Complaint  Patient presents with  . Hip Pain    HPI Vicki Dean is a 57 y.o. female.  Patient is a 57 year old female who presents to the emergency department with a complaint of right hip pain.  The patient states she has had problems with her hip off and on for some months.  She states that she had a fall several years ago when she is been having some pain off and on since that time.  She is not been seen or evaluated for this pain.  The patient also complains of some back pain.  No recent falls or injuries reported.  No recent operations or procedures.  No loss of bowel or bladder function.  Patient does state that at times when she puts weight on the hip that sometimes it feels as though it may give away with her.  She presents now for assistance and evaluation of this issue.   The history is provided by the patient.    Past Medical History:  Diagnosis Date  . Arthritis    possibly her right shoulder.  . Chronic back pain   . COPD (chronic obstructive pulmonary disease) (HCC)   . GERD (gastroesophageal reflux disease)   . Hypertension     Patient Active Problem List   Diagnosis Date Noted  . ANEMIA-NOS 07/06/2006  . DEPRESSION 07/06/2006  . COMMON MIGRAINE 07/06/2006  . HYPERTENSION 07/06/2006  . GERD 07/06/2006  . IRRITABLE BOWEL SYNDROME 07/06/2006  . INFECTION, URINARY TRACT NOS 07/06/2006  . DYSFUNCTIONAL UTERINE BLEEDING 07/06/2006  . LOW BACK PAIN 07/06/2006    Past Surgical History:  Procedure Laterality Date  . ABDOMINAL HYSTERECTOMY    . CARPAL TUNNEL RELEASE    . FOOT SURGERY    . GANGLION CYST EXCISION    . KNEE SURGERY      OB History    Gravida Para Term Preterm AB Living   3 2 2   1      SAB TAB Ectopic Multiple Live Births   1               Home Medications    Prior to Admission medications     Medication Sig Start Date End Date Taking? Authorizing Provider  albuterol (PROVENTIL HFA;VENTOLIN HFA) 108 (90 Base) MCG/ACT inhaler Inhale 1-2 puffs into the lungs every 6 (six) hours as needed for wheezing or shortness of breath.   Yes [provider]  budesonide-formoterol (SYMBICORT) 160-4.5 MCG/ACT inhaler Inhale 2 puffs into the lungs daily.   Yes [provider]  hydrochlorothiazide (HYDRODIURIL) 25 MG tablet Take 25 mg by mouth daily.   Yes [provider]  venlafaxine XR (EFFEXOR-XR) 75 MG 24 hr capsule Take 75 mg by mouth daily with breakfast.    Yes [provider]  traMADol (ULTRAM) 50 MG tablet Take 1 tablet (50 mg total) by mouth every 6 (six) hours as needed. Patient not taking: Reported on 06/28/2017 02/12/17   Gilda Crease, MD    Family History Family History  Problem Relation Age of Onset  . Hypertension Mother   . Diabetes Mother   . Arthritis Mother   . Hypercholesterolemia Mother   . Emphysema Father   . Cancer Other   . Diabetes Other     Social History Social History   Tobacco Use  .  Smoking status: Current Some Day Smoker  . Smokeless tobacco: Never Used  Substance Use Topics  . Alcohol use: Yes  . Drug use: No     Allergies   Lisinopril and Omeprazole   Review of Systems Review of Systems  Constitutional: Negative for activity change.       All ROS Neg except as noted in HPI  HENT: Negative for nosebleeds.   Eyes: Negative for photophobia and discharge.  Respiratory: Negative for cough, shortness of breath and wheezing.   Cardiovascular: Negative for chest pain and palpitations.  Gastrointestinal: Negative for abdominal pain and blood in stool.  Genitourinary: Negative for dysuria, frequency and hematuria.  Musculoskeletal: Positive for arthralgias and back pain. Negative for neck pain.  Skin: Negative.   Neurological: Negative for dizziness, seizures and speech difficulty.   Psychiatric/Behavioral: Negative for confusion and hallucinations.     Physical Exam Updated Vital Signs BP (!) 159/92 (BP Location: Right Arm)   Pulse 69   Temp 98.4 F (36.9 C) (Oral)   Resp 18   Ht 5\' 7"  (1.702 m)   Wt 99.8 kg (220 lb)   SpO2 97%   BMI 34.46 kg/m   Physical Exam  Constitutional: She is oriented to person, place, and time. She appears well-developed and well-nourished.  Non-toxic appearance.  HENT:  Head: Normocephalic.  Right Ear: Tympanic membrane and external ear normal.  Left Ear: Tympanic membrane and external ear normal.  Eyes: EOM and lids are normal. Pupils are equal, round, and reactive to light.  Neck: Normal range of motion. Neck supple. Carotid bruit is not present.  Cardiovascular: Normal rate, regular rhythm, normal heart sounds, intact distal pulses and normal pulses.  Pulmonary/Chest: Breath sounds normal. No respiratory distress.  Abdominal: Soft. Bowel sounds are normal. There is no tenderness. There is no guarding.  Musculoskeletal:       Right hip: She exhibits decreased range of motion and tenderness.       Lumbar back: She exhibits decreased range of motion, pain and spasm.       Back:  Lymphadenopathy:       Head (right side): No submandibular adenopathy present.       Head (left side): No submandibular adenopathy present.    She has no cervical adenopathy.  Neurological: She is alert and oriented to person, place, and time. She has normal strength. No cranial nerve deficit or sensory deficit.  Skin: Skin is warm and dry.  Psychiatric: She has a normal mood and affect. Her speech is normal.  Nursing note and vitals reviewed.    ED Treatments / Results  Labs (all labs ordered are listed, but only abnormal results are displayed) Labs Reviewed - No data to display  EKG  EKG Interpretation None       Radiology No results found.  Procedures Procedures (including critical care time)  Medications Ordered in  ED Medications - No data to display   Initial Impression / Assessment and Plan / ED Course  I have reviewed the triage vital signs and the nursing notes.  Pertinent labs & imaging results that were available during my care of the patient were reviewed by me and considered in my medical decision making (see chart for details).       Final Clinical Impressions(s) / ED Diagnoses  MDM  I have reviewed the previous lumbar spine films.  Patient has degenerative disc disease as well as arthritis changes multiple areas of the lumbar spine.  The examination shows some  pain with both attempted and adduction and abduction of the right hip.  Patient has some limitation in range of motion.  No hot joint appreciated at this time.  There is also pain with palpation and with change of range of motion involving the lower back.  I have asked the patient to see [facility] physicians [facility] or to see the orthopedic specialist here in town concerning her ongoing problem. Prescription for Flexeril, Decadron, and ibuprofen given to the patient.  Patient strongly advised to see her physicians at the [facility] as soon as possible.  Patient is in agreement with this plan.   Final diagnoses:  Right hip pain  Sciatica, unspecified laterality    ED Discharge Orders        Ordered    cyclobenzaprine (FLEXERIL) 10 MG tablet  3 times daily     07/25/17 1341    ibuprofen (ADVIL,MOTRIN) 600 MG tablet  4 times daily     07/25/17 1341    dexamethasone (DECADRON) 4 MG tablet  2 times daily with meals     07/25/17 1341       Ivery QualeBryant, Emelynn Rance, PA-C 07/25/17 2023    Raeford RazorKohut, Stephen, MD 07/26/17 [phone]

## 2017-07-31 ENCOUNTER — Other Ambulatory Visit: Payer: Self-pay | Admitting: *Deleted

## 2017-07-31 ENCOUNTER — Encounter: Payer: Self-pay | Admitting: *Deleted

## 2017-07-31 ENCOUNTER — Encounter: Payer: Self-pay | Admitting: Nurse Practitioner

## 2017-07-31 ENCOUNTER — Ambulatory Visit (INDEPENDENT_AMBULATORY_CARE_PROVIDER_SITE_OTHER): Payer: Non-veteran care | Admitting: Nurse Practitioner

## 2017-07-31 ENCOUNTER — Encounter: Payer: Self-pay | Admitting: Internal Medicine

## 2017-07-31 ENCOUNTER — Telehealth: Payer: Self-pay | Admitting: *Deleted

## 2017-07-31 VITALS — BP 156/88 | HR 75 | Temp 97.6°F | Ht 67.0 in | Wt 228.4 lb

## 2017-07-31 DIAGNOSIS — K581 Irritable bowel syndrome with constipation: Secondary | ICD-10-CM

## 2017-07-31 DIAGNOSIS — R1312 Dysphagia, oropharyngeal phase: Secondary | ICD-10-CM

## 2017-07-31 DIAGNOSIS — K219 Gastro-esophageal reflux disease without esophagitis: Secondary | ICD-10-CM

## 2017-07-31 DIAGNOSIS — K5900 Constipation, unspecified: Secondary | ICD-10-CM

## 2017-07-31 DIAGNOSIS — K921 Melena: Secondary | ICD-10-CM

## 2017-07-31 DIAGNOSIS — R1310 Dysphagia, unspecified: Secondary | ICD-10-CM | POA: Insufficient documentation

## 2017-07-31 MED ORDER — LINACLOTIDE 72 MCG PO CAPS: 72.0000 ug | ORAL_CAPSULE | Freq: Every day | ORAL | 0 refills | Status: DC

## 2017-07-31 MED ORDER — PANTOPRAZOLE SODIUM 40 MG PO TBEC: 40.0000 mg | DELAYED_RELEASE_TABLET | Freq: Every day | ORAL | 3 refills | Status: DC

## 2017-07-31 MED ORDER — PEG 3350-KCL-NA BICARB-NACL 420 G PO SOLR: 4000.0000 mL | Freq: Once | ORAL | 0 refills | Status: AC

## 2017-07-31 NOTE — Assessment & Plan Note (Signed)
Significant daily heartburn symptoms.  She is not currently on an acid blocker.  At this point I will start her on Protonix 40 mg twice a day due to the severity of her symptoms.  We will plan for an upper endoscopy and possible dilation as per below.  Follow-up in 3 months.

## 2017-07-31 NOTE — H&P (View-Only) (Signed)
[facility]  [facility] Primary Gastroenterologist:  Dr. Jena Gaussourk  Chief Complaint  Patient presents with  . Constipation  . Gastroesophageal Reflux  . Dysphagia    HPI:   Vicki Dean is a 57 y.o. female who presents on referral from the [facility] for chronic constipation.  She is also complaining of GERD and dysphasia as well.  Review of her authorization papers indicate approved for initial outpatient evaluation and treatment for upper and/or lower GI procedures, diagnostic images, labs, procedures as relevant including colonoscopy, EGD, EUS and associated interventions, pathology, anesthesia, a single follow-up visit unless EGD includes dilation at which 0.3 follow-up visits.  [facility] on 04/13/2017 status post release from incarceration.  Noted fibromyalgia and "pain everywhere."  History of colonoscopy or endoscopy in our system.  Today she states she's ok overall. Has had chronic constipation for years/decades. Has used stool softeners, lactulose, laxatives; typically cause loose stools then the next day she is constipated again. Bowel movement about every 3-7 days, stools hard, straining; occasionally has to disimpact herself. Does have some intermittent hematochezia when straining, thinks she has hemorrhoids. Also some RLQ abdominal pain, worse when she bends over. Also with GERD symptoms "terrible." Happens daily, thinks she's on an acid blocker but not sure what it's called. Has had EGD done 2008/2009 (can't remember exactly) which found erosions. Last colonoscopy 2009 which found polyps [place] [place] Also with dysphagia, like food and pills get hung, eventually passes with time; no regurgitation. Denies melena, fever, chills, unintentional weight loss. Also has a lot of stomach pain lower abdomen to mid abdomen; no improvement with bowel movement. Denies chest pain, dyspnea, dizziness, lightheadedness, syncope,  near syncope. Denies any other upper or lower GI symptoms.  Past Medical History:  Diagnosis Date  . Arthritis    possibly her right shoulder.  . Chronic back pain   . COPD (chronic obstructive pulmonary disease) (HCC)   . GERD (gastroesophageal reflux disease)   . Hypertension     Past Surgical History:  Procedure Laterality Date  . ABDOMINAL HYSTERECTOMY    . CARPAL TUNNEL RELEASE    . FOOT SURGERY    . GANGLION CYST EXCISION    . KNEE SURGERY      Current Outpatient Medications  Medication Sig Dispense Refill  . albuterol (PROVENTIL HFA;VENTOLIN HFA) 108 (90 Base) MCG/ACT inhaler Inhale 1-2 puffs into the lungs every 6 (six) hours as needed for wheezing or shortness of breath.    . budesonide-formoterol (SYMBICORT) 160-4.5 MCG/ACT inhaler Inhale 2 puffs into the lungs daily.    . cyclobenzaprine (FLEXERIL) 10 MG tablet Take 1 tablet (10 mg total) by mouth 3 (three) times daily. (Patient taking differently: Take 10 mg by mouth 3 (three) times daily as needed. ) 20 tablet 0  . dexamethasone (DECADRON) 4 MG tablet Take 1 tablet (4 mg total) by mouth 2 (two) times daily with a meal. 10 tablet 0  . hydrochlorothiazide (HYDRODIURIL) 25 MG tablet Take 25 mg by mouth daily.    Marland Kitchen. ibuprofen (ADVIL,MOTRIN) 600 MG tablet Take 1 tablet (600 mg total) by mouth 4 (four) times daily. (Patient taking differently: Take 600 mg by mouth as needed. ) 30 tablet 0  . traMADol (ULTRAM) 50 MG tablet Take 1 tablet (50 mg total) by mouth every 6 (six) hours as needed. 15 tablet 0  . venlafaxine XR (EFFEXOR-XR) 75 MG 24 hr capsule Take 75 mg by mouth  daily with breakfast.     . linaclotide (LINZESS) 72 MCG capsule Take 1 capsule (72 mcg total) by mouth daily before breakfast. 15 capsule 0  . pantoprazole (PROTONIX) 40 MG tablet Take 1 tablet (40 mg total) by mouth daily. 90 tablet 3   No current facility-administered medications for this visit.     Allergies as of 07/31/2017 - Review Complete 07/31/2017    Allergen Reaction Noted  . Doxepin  07/31/2017  . Lisinopril  06/28/2017  . Omeprazole  06/28/2017  . Pneumococcal vaccines  07/31/2017    Family History  Problem Relation Age of Onset  . Hypertension Mother   . Diabetes Mother   . Arthritis Mother   . Hypercholesterolemia Mother   . Emphysema Father   . Cancer Other   . Diabetes Other   . Colon cancer Neg Hx   . Gastric cancer Neg Hx   . Esophageal cancer Neg Hx     Social History   Socioeconomic History  . Marital status: Single    Spouse name: Not on file  . Number of children: Not on file  . Years of education: Not on file  . Highest education level: Not on file  Social Needs  . Financial resource strain: Not on file  . Food insecurity - worry: Not on file  . Food insecurity - inability: Not on file  . Transportation needs - medical: Not on file  . Transportation needs - non-medical: Not on file  Occupational History  . Not on file  Tobacco Use  . Smoking status: Former Games developer  . Smokeless tobacco: Never Used  Substance and Sexual Activity  . Alcohol use: No    Frequency: Never    Comment: in the past  . Drug use: No    Comment: in the past - alcohol, crack, marijuana; Last use 2015  . Sexual activity: Not on file  Other Topics Concern  . Not on file  Social History Narrative  . Not on file    Review of Systems: General: Negative for anorexia, weight loss, fever, chills, fatigue, weakness. ENT: Negative for hoarseness, difficulty swallowing , nasal congestion. CV: Negative for chest pain, angina, palpitations, peripheral edema.  Respiratory: Negative for dyspnea at rest, cough, sputum. Admits chronic wheezing (is on inhalers GI: See history of present illness. MS: Chronic joint pain.  Derm: Negative for rash or itching.  Endo: Negative for unusual weight change.  Heme: Negative for bruising or bleeding. Allergy: Negative for rash or hives.    Physical Exam: BP (!) 156/88   Pulse 75   Temp  97.6 F (36.4 C) (Oral)   Ht 5\' 7"  (1.702 m)   Wt 228 lb 6.4 oz (103.6 kg)   BMI 35.77 kg/m  General:   Obese female. Alert and oriented. Pleasant and cooperative. Well-nourished and well-developed.  Head:  Normocephalic and atraumatic. Eyes:  Without icterus, sclera clear and conjunctiva pink.  Ears:  Normal auditory acuity. Cardiovascular:  S1, S2 present without murmurs appreciated. Extremities without clubbing or edema. Respiratory:  Clear to auscultation bilaterally. No wheezes, rales, or rhonchi. No distress.  Gastrointestinal:  +BS, rounded but soft, and non-distended. Mild to moderate TTP epigastric region. No HSM noted. No guarding or rebound. No masses appreciated.  Rectal:  Deferred  Musculoskalatal:  Symmetrical without gross deformities. Normal posture. Neurologic:  Alert and oriented x4;  grossly normal neurologically. Psych:  Alert and cooperative. Normal mood and affect. Heme/Lymph/Immune: No excessive bruising noted.    07/31/2017 9:12  AM   Disclaimer: This note was dictated with voice recognition software. Similar sounding words can inadvertently be transcribed and may not be corrected upon review.

## 2017-07-31 NOTE — Assessment & Plan Note (Addendum)
Patient describes persistent hematochezia in the setting of long-standing chronic constipation.  She thinks she has hemorrhoids.  Blood is intermittent and typically in the stool/commode.  We will treat her constipation with Linzess as per above.  At this point it is been 10 years since her last colonoscopy which found polyps.  We do not have the report available to [place].  We will attempt to request it from the [facility].  She is just released from incarceration.  At this point, given her need for upper endoscopy in addition to 10 years since last colonoscopy we will plan for colonoscopy at the same time on propofol/MAC.  Return for follow-up in 3 months.  Proceed with TCS propofol/MAC with Dr. Gala Romney in near future: the risks, benefits, and alternatives have been discussed with the patient in detail. The patient states understanding and desires to proceed.  The patient is currently on Effexor, Ultram.  History of chronic drug use.  Recently released from incarceration.  No other anticoagulants, anxiolytics, chronic pain medications, or antidepressants.  We will plan for the procedure on propofol/MAC to promote adequate sedation.

## 2017-07-31 NOTE — Patient Instructions (Addendum)
1. We will schedule your procedures for you. 2. I have sent in Protonix 40 mg to your pharmacy.  Take this twice a day, on an empty stomach. 3. I am giving you samples of Linzess 72 mcg.  Take this once a day, also on an empty stomach. 4. Call us in 1-2 weeks and let us know if it is helping her constipation. 5. Keep in mind he may have some diarrhea initially, but this should resolve in approximately 5 days.  If it is persistent beyond then or if it is simply intolerable, call our office. 6. Avoid all NSAIDs (ibuprofen, Motrin, Advil, Aleve, naproxen, Naprosyn, any over-the-counter pain reliever with "NSAID" on the bottle.)  These medications can make your heartburn and swallowing problems worse.   7. Tylenol is okay to take. 8. We will schedule your procedures for you. 9. Further recommendations will be made after your procedures. 10. Follow-up in 3 months.

## 2017-07-31 NOTE — Assessment & Plan Note (Signed)
Patient with an apparent history of irritable bowel syndrome.  She presents today noting decades long chronic constipation.  She typically has a bowel movement every 3-6 days.  She has significant straining, hard stools which are difficult to pass.  She does have associated abdominal pain as well.  She is unsure if this improves after a bowel movement.  At this point she has tried options such as lactulose, over-the-counter laxatives, and self disimpaction.  We will trial her on Linzess 72 mcg daily with samples to last 2 weeks and requested progress report in 1-2 weeks.  Follow-up in 3 months.  She is also due for colonoscopy, as per below.

## 2017-07-31 NOTE — Telephone Encounter (Signed)
LM to call back. Pre-op scheduled for 08/20/17 at 9:00am. Letter also mailed

## 2017-07-31 NOTE — Assessment & Plan Note (Addendum)
The patient admits solid food and pill dysphasia.  She has chronic, poorly managed GERD.  Denies regurgitation, food and pills typically pass with time.  Given her long-standing GERD there is a possibility of stricture, web, ring.  Her significant GERD symptoms also could be related to esophageal erosions, gastric erosions or ulcers, duodenal ulcer.  She had said had a EGD about 10 years ago which found erosions, per the patient.  At this point we will plan for EGD on MAC sedation to further evaluate and treat dysphasia.  Proceed with EGD +/- dilation on propofol/MAC with Dr. Gala Romney in near future: the risks, benefits, and alternatives have been discussed with the patient in detail. The patient states understanding and desires to proceed.  The patient is currently on Effexor, Ultram.  History of chronic drug use.  Recently released from incarceration.  No other anticoagulants, anxiolytics, chronic pain medications, or antidepressants.  We will plan for the procedure on propofol/MAC to promote adequate sedation.

## 2017-07-31 NOTE — Progress Notes (Signed)
CC'D TO PCP °

## 2017-07-31 NOTE — Progress Notes (Signed)
[facility]  [facility] Primary Gastroenterologist:  Dr. Jena Gaussourk  Chief Complaint  Patient presents with  . Constipation  . Gastroesophageal Reflux  . Dysphagia    HPI:   Vicki Dean is a 57 y.o. female who presents on referral [facility] the [facility] for chronic constipation.  She is also complaining of GERD and dysphasia as well.  Review of her authorization papers indicate approved for initial outpatient evaluation and treatment for upper and/or lower GI procedures, diagnostic images, labs, procedures as relevant including colonoscopy, EGD, EUS and associated interventions, pathology, anesthesia, a single follow-up visit unless EGD includes dilation at which 0.3 follow-up visits.  [facility] on 04/13/2017 status post release from incarceration.  Noted fibromyalgia and "pain everywhere."  History of colonoscopy or endoscopy in our system.  Today she states she's ok overall. Has had chronic constipation for years/decades. Has used stool softeners, lactulose, laxatives; typically cause loose stools then the next day she is constipated again. Bowel movement about every 3-7 days, stools hard, straining; occasionally has to disimpact herself. Does have some intermittent hematochezia when straining, thinks she has hemorrhoids. Also some RLQ abdominal pain, worse when she bends over. Also with GERD symptoms "terrible." Happens daily, thinks she's on an acid blocker but not sure what it's called. Has had EGD done 2008/2009 (can't remember exactly) which found erosions. Last colonoscopy 2009 which found polyps [place] [place] Also with dysphagia, like food and pills get hung, eventually passes with time; no regurgitation. Denies melena, fever, chills, unintentional weight loss. Also has a lot of stomach pain lower abdomen to mid abdomen; no improvement with bowel movement. Denies chest pain, dyspnea, dizziness, lightheadedness, syncope,  near syncope. Denies any other upper or lower GI symptoms.  Past Medical History:  Diagnosis Date  . Arthritis    possibly her right shoulder.  . Chronic back pain   . COPD (chronic obstructive pulmonary disease) (HCC)   . GERD (gastroesophageal reflux disease)   . Hypertension     Past Surgical History:  Procedure Laterality Date  . ABDOMINAL HYSTERECTOMY    . CARPAL TUNNEL RELEASE    . FOOT SURGERY    . GANGLION CYST EXCISION    . KNEE SURGERY      Current Outpatient Medications  Medication Sig Dispense Refill  . albuterol (PROVENTIL HFA;VENTOLIN HFA) 108 (90 Base) MCG/ACT inhaler Inhale 1-2 puffs into the lungs every 6 (six) hours as needed for wheezing or shortness of breath.    . budesonide-formoterol (SYMBICORT) 160-4.5 MCG/ACT inhaler Inhale 2 puffs into the lungs daily.    . cyclobenzaprine (FLEXERIL) 10 MG tablet Take 1 tablet (10 mg total) by mouth 3 (three) times daily. (Patient taking differently: Take 10 mg by mouth 3 (three) times daily as needed. ) 20 tablet 0  . dexamethasone (DECADRON) 4 MG tablet Take 1 tablet (4 mg total) by mouth 2 (two) times daily with a meal. 10 tablet 0  . hydrochlorothiazide (HYDRODIURIL) 25 MG tablet Take 25 mg by mouth daily.    Marland Kitchen. ibuprofen (ADVIL,MOTRIN) 600 MG tablet Take 1 tablet (600 mg total) by mouth 4 (four) times daily. (Patient taking differently: Take 600 mg by mouth as needed. ) 30 tablet 0  . traMADol (ULTRAM) 50 MG tablet Take 1 tablet (50 mg total) by mouth every 6 (six) hours as needed. 15 tablet 0  . venlafaxine XR (EFFEXOR-XR) 75 MG 24 hr capsule Take 75 mg by mouth  daily with breakfast.     . linaclotide (LINZESS) 72 MCG capsule Take 1 capsule (72 mcg total) by mouth daily before breakfast. 15 capsule 0  . pantoprazole (PROTONIX) 40 MG tablet Take 1 tablet (40 mg total) by mouth daily. 90 tablet 3   No current facility-administered medications for this visit.     Allergies as of 07/31/2017 - Review Complete 07/31/2017    Allergen Reaction Noted  . Doxepin  07/31/2017  . Lisinopril  06/28/2017  . Omeprazole  06/28/2017  . Pneumococcal vaccines  07/31/2017    Family History  Problem Relation Age of Onset  . Hypertension Mother   . Diabetes Mother   . Arthritis Mother   . Hypercholesterolemia Mother   . Emphysema Father   . Cancer Other   . Diabetes Other   . Colon cancer Neg Hx   . Gastric cancer Neg Hx   . Esophageal cancer Neg Hx     Social History   Socioeconomic History  . Marital status: Single    Spouse name: Not on file  . Number of children: Not on file  . Years of education: Not on file  . Highest education level: Not on file  Social Needs  . Financial resource strain: Not on file  . Food insecurity - worry: Not on file  . Food insecurity - inability: Not on file  . Transportation needs - medical: Not on file  . Transportation needs - non-medical: Not on file  Occupational History  . Not on file  Tobacco Use  . Smoking status: Former Games developer  . Smokeless tobacco: Never Used  Substance and Sexual Activity  . Alcohol use: No    Frequency: Never    Comment: in the past  . Drug use: No    Comment: in the past - alcohol, crack, marijuana; Last use 2015  . Sexual activity: Not on file  Other Topics Concern  . Not on file  Social History Narrative  . Not on file    Review of Systems: General: Negative for anorexia, weight loss, fever, chills, fatigue, weakness. ENT: Negative for hoarseness, difficulty swallowing , nasal congestion. CV: Negative for chest pain, angina, palpitations, peripheral edema.  Respiratory: Negative for dyspnea at rest, cough, sputum. Admits chronic wheezing (is on inhalers GI: See history of present illness. MS: Chronic joint pain.  Derm: Negative for rash or itching.  Endo: Negative for unusual weight change.  Heme: Negative for bruising or bleeding. Allergy: Negative for rash or hives.    Physical Exam: BP (!) 156/88   Pulse 75   Temp  97.6 F (36.4 C) (Oral)   Ht 5\' 7"  (1.702 m)   Wt 228 lb 6.4 oz (103.6 kg)   BMI 35.77 kg/m  General:   Obese female. Alert and oriented. Pleasant and cooperative. Well-nourished and well-developed.  Head:  Normocephalic and atraumatic. Eyes:  Without icterus, sclera clear and conjunctiva pink.  Ears:  Normal auditory acuity. Cardiovascular:  S1, S2 present without murmurs appreciated. Extremities without clubbing or edema. Respiratory:  Clear to auscultation bilaterally. No wheezes, rales, or rhonchi. No distress.  Gastrointestinal:  +BS, rounded but soft, and non-distended. Mild to moderate TTP epigastric region. No HSM noted. No guarding or rebound. No masses appreciated.  Rectal:  Deferred  Musculoskalatal:  Symmetrical without gross deformities. Normal posture. Neurologic:  Alert and oriented x4;  grossly normal neurologically. Psych:  Alert and cooperative. Normal mood and affect. Heme/Lymph/Immune: No excessive bruising noted.    07/31/2017 9:12  AM   Disclaimer: This note was dictated with voice recognition software. Similar sounding words can inadvertently be transcribed and may not be corrected upon review.

## 2017-08-03 ENCOUNTER — Ambulatory Visit (HOSPITAL_COMMUNITY): Payer: Non-veteran care

## 2017-08-15 NOTE — Patient Instructions (Signed)
Vicki LodgeSharon M Dean  08/15/2017     @[email or web address] @   Your procedure is scheduled on  08/23/2017   [facility]  [facility]   [facility].M.  Call this number if you have problems the morning of surgery:  [phone]   Remember:  Do not eat food or drink liquids after midnight.  Take these medicines the morning of surgery with A SIP OF WATER  Amlodipine, prilosec, effexor. Use your inhalers before you come.   Do not wear jewelry, make-up or nail polish.  Do not wear lotions, powders, or perfumes, or deodorant.  Do not shave 48 hours prior to surgery.  Men may shave face and neck.  Do not bring valuables to the hospital.  [facility] is not responsible for any belongings or valuables.  Contacts, dentures or bridgework may not be worn into surgery.  Leave your suitcase in the car.  After surgery it may be brought to your room.  For patients admitted to the hospital, discharge time will be determined by your treatment team.  Patients discharged the day of surgery will not be allowed to drive home.   Name and phone number of your driver:   family Special instructions:  Follow the diet and prep instructions given to you by Dr Luvenia Starchourk's office.  Please read over the following fact sheets that you were given. Anesthesia Post-op Instructions and Care and Recovery After Surgery       Esophagogastroduodenoscopy Esophagogastroduodenoscopy (EGD) is a procedure to examine the lining of the esophagus, stomach, and first part of the small intestine (duodenum). This procedure is done to check for problems such as inflammation, bleeding, ulcers, or growths. During this procedure, a long, flexible, lighted tube with a camera attached (endoscope) is inserted down the throat. Tell a health care provider about:  Any allergies you have.  All medicines you are taking, including vitamins, herbs, eye drops, creams, and over-the-counter medicines.  Any problems you or family  members have had with anesthetic medicines.  Any blood disorders you have.  Any surgeries you have had.  Any medical conditions you have.  Whether you are pregnant or may be pregnant. What are the risks? Generally, this is a safe procedure. However, problems may occur, including:  Infection.  Bleeding.  A tear (perforation) in the esophagus, stomach, or duodenum.  Trouble breathing.  Excessive sweating.  Spasms of the larynx.  A slowed heartbeat.  Low blood pressure.  What happens before the procedure?  Follow instructions from your health care provider about eating or drinking restrictions.  Ask your health care provider about: ? Changing or stopping your regular medicines. This is especially important if you are taking diabetes medicines or blood thinners. ? Taking medicines such as aspirin and ibuprofen. These medicines can thin your blood. Do not take these medicines before your procedure if your health care provider instructs you not to.  Plan to have someone take you home after the procedure.  If you wear dentures, be ready to remove them before the procedure. What happens during the procedure?  To reduce your risk of infection, your health care team will wash or sanitize their hands.  An IV tube will be put in a vein in your hand or arm. You will get medicines and fluids through this tube.  You will be given one or more of the following: ? A medicine to help you relax (sedative). ?  A medicine to numb the area (local anesthetic). This medicine may be sprayed into your throat. It will make you feel more comfortable and keep you from gagging or coughing during the procedure. ? A medicine for pain.  A mouth guard may be placed in your mouth to protect your teeth and to keep you from biting on the endoscope.  You will be asked to lie on your left side.  The endoscope will be lowered down your throat into your esophagus, stomach, and duodenum.  Air will be put  into the endoscope. This will help your health care provider see better.  The lining of your esophagus, stomach, and duodenum will be examined.  Your health care provider may: ? Take a tissue sample so it can be looked at in a lab (biopsy). ? Remove growths. ? Remove objects (foreign bodies) that are stuck. ? Treat any bleeding with medicines or other devices that stop tissue from bleeding. ? Widen (dilate) or stretch narrowed areas of your esophagus and stomach.  The endoscope will be taken out. The procedure may vary among health care providers and hospitals. What happens after the procedure?  Your blood pressure, heart rate, breathing rate, and blood oxygen level will be monitored often until the medicines you were given have worn off.  Do not eat or drink anything until the numbing medicine has worn off and your gag reflex has returned. This information is not intended to replace advice given to you by your health care provider. Make sure you discuss any questions you have with your health care provider. Document Released: 10/06/2004 Document Revised: 11/11/2015 Document Reviewed: 04/29/2015 Elsevier Interactive Patient Education  2018 [employer]. Esophagogastroduodenoscopy, Care After Refer to this sheet in the next few weeks. These instructions provide you with information about caring for yourself after your procedure. Your health care provider may also give you more specific instructions. Your treatment has been planned according to current medical practices, but problems sometimes occur. Call your health care provider if you have any problems or questions after your procedure. What can I expect after the procedure? After the procedure, it is common to have:  A sore throat.  Nausea.  Bloating.  Dizziness.  Fatigue.  Follow these instructions at home:  Do not eat or drink anything until the numbing medicine (local anesthetic) has worn off and your gag reflex has  returned. You will know that the local anesthetic has worn off when you can swallow comfortably.  Do not drive for 24 hours if you received a medicine to help you relax (sedative).  If your health care provider took a tissue sample for testing during the procedure, make sure to get your test results. This is your responsibility. Ask your health care provider or the department performing the test when your results will be ready.  Keep all follow-up visits as told by your health care provider. This is important. Contact a health care provider if:  You cannot stop coughing.  You are not urinating.  You are urinating less than usual. Get help right away if:  You have trouble swallowing.  You cannot eat or drink.  You have throat or chest pain that gets worse.  You are dizzy or light-headed.  You faint.  You have nausea or vomiting.  You have chills.  You have a fever.  You have severe abdominal pain.  You have black, tarry, or bloody stools. This information is not intended to replace advice given to you by your health  care provider. Make sure you discuss any questions you have with your health care provider. Document Released: 05/22/2012 Document Revised: 11/11/2015 Document Reviewed: 04/29/2015 Elsevier Interactive Patient Education  2018 [employer].  Esophageal Dilatation Esophageal dilatation is a procedure to open a blocked or narrowed part of the esophagus. The esophagus is the long tube in your throat that carries food and liquid from your mouth to your stomach. The procedure is also called esophageal dilation. You may need this procedure if you have a buildup of scar tissue in your esophagus that makes it difficult, painful, or even impossible to swallow. This can be caused by gastroesophageal reflux disease (GERD). In rare cases, people need this procedure because they have cancer of the esophagus or a problem with the way food moves through the esophagus. Sometimes  you may need to have another dilatation to enlarge the opening of the esophagus gradually. Tell a health care provider about:  Any allergies you have.  All medicines you are taking, including vitamins, herbs, eye drops, creams, and over-the-counter medicines.  Any problems you or family members have had with anesthetic medicines.  Any blood disorders you have.  Any surgeries you have had.  Any medical conditions you have.  Any antibiotic medicines you are required to take before dental procedures. What are the risks? Generally, this is a safe procedure. However, problems can occur and include:  Bleeding from a tear in the lining of the esophagus.  A hole (perforation) in the esophagus.  What happens before the procedure?  Do not eat or drink anything after midnight on the night before the procedure or as directed by your health care provider.  Ask your health care provider about changing or stopping your regular medicines. This is especially important if you are taking diabetes medicines or blood thinners.  Plan to have someone take you home after the procedure. What happens during the procedure?  You will be given a medicine that makes you relaxed and sleepy (sedative).  A medicine may be sprayed or gargled to numb the back of the throat.  Your health care provider can use various instruments to do an esophageal dilatation. During the procedure, the instrument used will be placed in your mouth and passed down into your esophagus. Options include: ? Simple dilators. This instrument is carefully placed in the esophagus to stretch it. ? Guided wire bougies. In this method, a flexible tube (endoscope) is used to insert a wire into the esophagus. The dilator is passed over this wire to enlarge the esophagus. Then the wire is removed. ? Balloon dilators. An endoscope with a small balloon at the end is passed down into the esophagus. Inflating the balloon gently stretches the  esophagus and opens it up. What happens after the procedure?  Your blood pressure, heart rate, breathing rate, and blood oxygen level will be monitored often until the medicines you were given have worn off.  Your throat may feel slightly sore and will probably still feel numb. This will improve slowly over time.  You will not be allowed to eat or drink until the throat numbness has resolved.  If this is a same-day procedure, you may be allowed to go home once you have been able to drink, urinate, and sit on the edge of the bed without nausea or dizziness.  If this is a same-day procedure, you should have a friend or family member with you for the next 24 hours after the procedure. This information is not intended to  replace advice given to you by your health care provider. Make sure you discuss any questions you have with your health care provider. Document Released: 07/27/2005 Document Revised: 11/11/2015 Document Reviewed: 10/15/2013 Elsevier Interactive Patient Education  [employer].  Colonoscopy, Adult A colonoscopy is an exam to look at the large intestine. It is done to check for problems, such as:  Lumps (tumors).  Growths (polyps).  Swelling (inflammation).  Bleeding.  What happens before the procedure? Eating and drinking Follow instructions from your doctor about eating and drinking. These instructions may include:  A few days before the procedure - follow a low-fiber diet. ? Avoid nuts. ? Avoid seeds. ? Avoid dried fruit. ? Avoid raw fruits. ? Avoid vegetables.  1-3 days before the procedure - follow a clear liquid diet. Avoid liquids that have red or purple dye. Drink only clear liquids, such as: ? Clear broth or bouillon. ? Black coffee or tea. ? Clear juice. ? Clear soft drinks or sports drinks. ? Gelatin dessert. ? Popsicles.  On the day of the procedure - do not eat or drink anything during the 2 hours before the procedure.  Bowel prep If you  were prescribed an oral bowel prep:  Take it as told by your doctor. Starting the day before your procedure, you will need to drink a lot of liquid. The liquid will cause you to poop (have bowel movements) until your poop is almost clear or light green.  If your skin or butt gets irritated from diarrhea, you may: ? Wipe the area with wipes that have medicine in them, such as adult wet wipes with aloe and vitamin E. ? Put something on your skin that soothes the area, such as petroleum jelly.  If you throw up (vomit) while drinking the bowel prep, take a break for up to 60 minutes. Then begin the bowel prep again. If you keep throwing up and you cannot take the bowel prep without throwing up, call your doctor.  General instructions  Ask your doctor about changing or stopping your normal medicines. This is important if you take diabetes medicines or blood thinners.  Plan to have someone take you home from the hospital or clinic. What happens during the procedure?  An IV tube may be put into one of your veins.  You will be given medicine to help you relax (sedative).  To reduce your risk of infection: ? Your doctors will wash their hands. ? Your anal area will be washed with soap.  You will be asked to lie on your side with your knees bent.  Your doctor will get a long, thin, flexible tube ready. The tube will have a camera and a light on the end.  The tube will be put into your anus.  The tube will be gently put into your large intestine.  Air will be delivered into your large intestine to keep it open. You may feel some pressure or cramping.  The camera will be used to take photos.  A small tissue sample may be removed from your body to be looked at under a microscope (biopsy). If any possible problems are found, the tissue will be sent to a lab for testing.  If small growths are found, your doctor may remove them and have them checked for cancer.  The tube that was put into  your anus will be slowly removed. The procedure may vary among doctors and hospitals. What happens after the procedure?  Your doctor will check  on you often until the medicines you were given have worn off.  Do not drive for 24 hours after the procedure.  You may have a small amount of blood in your poop.  You may pass gas.  You may have mild cramps or bloating in your belly (abdomen).  It is up to you to get the results of your procedure. Ask your doctor, or the department performing the procedure, when your results will be ready. This information is not intended to replace advice given to you by your health care provider. Make sure you discuss any questions you have with your health care provider. Document Released: 07/08/2010 Document Revised: 04/05/2016 Document Reviewed: 08/17/2015 Elsevier Interactive Patient Education  2017 [employer]  Colonoscopy, Adult, Care After This sheet gives you information about how to care for yourself after your procedure. Your health care provider may also give you more specific instructions. If you have problems or questions, contact your health care provider. What can I expect after the procedure? After the procedure, it is common to have:  A small amount of blood in your stool for 24 hours after the procedure.  Some gas.  Mild abdominal cramping or bloating.  Follow these instructions at home: General instructions   For the first 24 hours after the procedure: ? Do not drive or use machinery. ? Do not sign important documents. ? Do not drink alcohol. ? Do your regular daily activities at a slower pace than normal. ? Eat soft, easy-to-digest foods. ? Rest often.  Take over-the-counter or prescription medicines only as told by your health care provider.  It is up to you to get the results of your procedure. Ask your health care provider, or the department performing the procedure, when your results will be ready. Relieving cramping  and bloating  Try walking around when you have cramps or feel bloated.  Apply heat to your abdomen as told by your health care provider. Use a heat source that your health care provider recommends, such as a moist heat pack or a heating pad. ? Place a towel between your skin and the heat source. ? Leave the heat on for 20-30 minutes. ? Remove the heat if your skin turns bright red. This is especially important if you are unable to feel pain, heat, or cold. You may have a greater risk of getting burned. Eating and drinking  Drink enough fluid to keep your urine clear or pale yellow.  Resume your normal diet as instructed by your health care provider. Avoid heavy or fried foods that are hard to digest.  Avoid drinking alcohol for as long as instructed by your health care provider. Contact a health care provider if:  You have blood in your stool 2-3 days after the procedure. Get help right away if:  You have more than a small spotting of blood in your stool.  You pass large blood clots in your stool.  Your abdomen is swollen.  You have nausea or vomiting.  You have a fever.  You have increasing abdominal pain that is not relieved with medicine. This information is not intended to replace advice given to you by your health care provider. Make sure you discuss any questions you have with your health care provider. Document Released: 01/18/2004 Document Revised: 02/28/2016 Document Reviewed: 08/17/2015 Elsevier Interactive Patient Education  2018 [place] Anesthesia is a term that refers to techniques, procedures, and medicines that help a person stay safe and comfortable during  a medical procedure. Monitored anesthesia care, or sedation, is one type of anesthesia. Your anesthesia specialist may recommend sedation if you will be having a procedure that does not require you to be unconscious, such as:  Cataract surgery.  A dental procedure.  A  biopsy.  A colonoscopy.  During the procedure, you may receive a medicine to help you relax (sedative). There are three levels of sedation:  Mild sedation. At this level, you may feel awake and relaxed. You will be able to follow directions.  Moderate sedation. At this level, you will be sleepy. You may not remember the procedure.  Deep sedation. At this level, you will be asleep. You will not remember the procedure.  The more medicine you are given, the deeper your level of sedation will be. Depending on how you respond to the procedure, the anesthesia specialist may change your level of sedation or the type of anesthesia to fit your needs. An anesthesia specialist will monitor you closely during the procedure. Let your health care provider know about:  Any allergies you have.  All medicines you are taking, including vitamins, herbs, eye drops, creams, and over-the-counter medicines.  Any use of steroids (by mouth or as a cream).  Any problems you or family members have had with sedatives and anesthetic medicines.  Any blood disorders you have.  Any surgeries you have had.  Any medical conditions you have, such as sleep apnea.  Whether you are pregnant or may be pregnant.  Any use of cigarettes, alcohol, or street drugs. What are the risks? Generally, this is a safe procedure. However, problems may occur, including:  Getting too much medicine (oversedation).  Nausea.  Allergic reaction to medicines.  Trouble breathing. If this happens, a breathing tube may be used to help with breathing. It will be removed when you are awake and breathing on your own.  Heart trouble.  Lung trouble.  Before the procedure Staying hydrated Follow instructions from your health care provider about hydration, which may include:  Up to 2 hours before the procedure - you may continue to drink clear liquids, such as water, clear fruit juice, black coffee, and plain tea.  Eating and  drinking restrictions Follow instructions from your health care provider about eating and drinking, which may include:  8 hours before the procedure - stop eating heavy meals or foods such as meat, fried foods, or fatty foods.  6 hours before the procedure - stop eating light meals or foods, such as toast or cereal.  6 hours before the procedure - stop drinking milk or drinks that contain milk.  2 hours before the procedure - stop drinking clear liquids.  Medicines Ask your health care provider about:  Changing or stopping your regular medicines. This is especially important if you are taking diabetes medicines or blood thinners.  Taking medicines such as aspirin and ibuprofen. These medicines can thin your blood. Do not take these medicines before your procedure if your health care provider instructs you not to.  Tests and exams  You will have a physical exam.  You may have blood tests done to show: ? How well your kidneys and liver are working. ? How well your blood can clot.  General instructions  Plan to have someone take you home from the hospital or clinic.  If you will be going home right after the procedure, plan to have someone with you for 24 hours.  What happens during the procedure?  Your blood pressure, heart rate,  breathing, level of pain and overall condition will be monitored.  An IV tube will be inserted into one of your veins.  Your anesthesia specialist will give you medicines as needed to keep you comfortable during the procedure. This may mean changing the level of sedation.  The procedure will be performed. After the procedure  Your blood pressure, heart rate, breathing rate, and blood oxygen level will be monitored until the medicines you were given have worn off.  Do not drive for 24 hours if you received a sedative.  You may: ? Feel sleepy, clumsy, or nauseous. ? Feel forgetful about what happened after the procedure. ? Have a sore throat if  you had a breathing tube during the procedure. ? Vomit. This information is not intended to replace advice given to you by your health care provider. Make sure you discuss any questions you have with your health care provider. Document Released: 03/01/2005 Document Revised: 11/12/2015 Document Reviewed: 09/26/2015 Elsevier Interactive Patient Education  2018 [place], Care After These instructions provide you with information about caring for yourself after your procedure. Your health care provider may also give you more specific instructions. Your treatment has been planned according to current medical practices, but problems sometimes occur. Call your health care provider if you have any problems or questions after your procedure. What can I expect after the procedure? After your procedure, it is common to:  Feel sleepy for several hours.  Feel clumsy and have poor balance for several hours.  Feel forgetful about what happened after the procedure.  Have poor judgment for several hours.  Feel nauseous or vomit.  Have a sore throat if you had a breathing tube during the procedure.  Follow these instructions at home: For at least 24 hours after the procedure:   Do not: ? Participate in activities in which you could fall or become injured. ? Drive. ? Use heavy machinery. ? Drink alcohol. ? Take sleeping pills or medicines that cause drowsiness. ? Make important decisions or sign legal documents. ? Take care of children on your own.  Rest. Eating and drinking  Follow the diet that is recommended by your health care provider.  If you vomit, drink water, juice, or soup when you can drink without vomiting.  Make sure you have little or no nausea before eating solid foods. General instructions  Have a responsible adult stay with you until you are awake and alert.  Take over-the-counter and prescription medicines only as told by your health care  provider.  If you smoke, do not smoke without supervision.  Keep all follow-up visits as told by your health care provider. This is important. Contact a health care provider if:  You keep feeling nauseous or you keep vomiting.  You feel light-headed.  You develop a rash.  You have a fever. Get help right away if:  You have trouble breathing. This information is not intended to replace advice given to you by your health care provider. Make sure you discuss any questions you have with your health care provider. Document Released: 09/26/2015 Document Revised: 01/26/2016 Document Reviewed: 09/26/2015 Elsevier Interactive Patient Education  [employer].

## 2017-08-20 ENCOUNTER — Other Ambulatory Visit: Payer: Self-pay

## 2017-08-20 ENCOUNTER — Encounter (HOSPITAL_COMMUNITY)
Admission: RE | Admit: 2017-08-20 | Discharge: 2017-08-20 | Disposition: A | Discharge status: Home / Self Care | Payer: Non-veteran care | Source: Ambulatory Visit | Attending: Internal Medicine | Admitting: Internal Medicine

## 2017-08-20 ENCOUNTER — Encounter (HOSPITAL_COMMUNITY): Payer: Self-pay

## 2017-08-20 DIAGNOSIS — R9431 Abnormal electrocardiogram [ECG] [EKG]: Secondary | ICD-10-CM | POA: Diagnosis not present

## 2017-08-20 DIAGNOSIS — Z1231 Encounter for screening mammogram for malignant neoplasm of breast: Secondary | ICD-10-CM | POA: Diagnosis present

## 2017-08-20 DIAGNOSIS — Z01812 Encounter for preprocedural laboratory examination: Secondary | ICD-10-CM | POA: Diagnosis present

## 2017-08-20 DIAGNOSIS — Z01810 Encounter for preprocedural cardiovascular examination: Secondary | ICD-10-CM | POA: Diagnosis present

## 2017-08-20 HISTORY — DX: Anxiety disorder, unspecified: F41.9

## 2017-08-20 HISTORY — DX: Sleep apnea, unspecified: G47.30

## 2017-08-20 HISTORY — DX: Fibromyalgia: M79.7

## 2017-08-20 LAB — CBC WITH DIFFERENTIAL/PLATELET
BASOS ABS: 0 10*3/uL (ref 0.0–0.1)
Basophils Relative: 0 %
EOS PCT: 2 %
Eosinophils Absolute: 0.1 10*3/uL (ref 0.0–0.7)
HEMATOCRIT: 34.9 % — AB (ref 36.0–46.0)
Hemoglobin: 11.1 g/dL — ABNORMAL LOW (ref 12.0–15.0)
LYMPHS ABS: 1.5 10*3/uL (ref 0.7–4.0)
Lymphocytes Relative: 36 %
MCH: 21.1 pg — ABNORMAL LOW (ref 26.0–34.0)
MCHC: 31.8 g/dL (ref 30.0–36.0)
MCV: 66.5 fL — AB (ref 78.0–100.0)
MONO ABS: 0.3 10*3/uL (ref 0.1–1.0)
MONOS PCT: 7 %
NEUTROS ABS: 2.3 10*3/uL (ref 1.7–7.7)
Neutrophils Relative %: 55 %
Platelets: 228 10*3/uL (ref 150–400)
RBC: 5.25 MIL/uL — ABNORMAL HIGH (ref 3.87–5.11)
RDW: 16.4 % — AB (ref 11.5–15.5)
WBC: 4.2 10*3/uL (ref 4.0–10.5)

## 2017-08-20 LAB — BASIC METABOLIC PANEL
Anion gap: 9 (ref 5–15)
BUN: 14 mg/dL (ref 6–20)
CALCIUM: 9.1 mg/dL (ref 8.9–10.3)
CO2: 23 mmol/L (ref 22–32)
CREATININE: 0.63 mg/dL (ref 0.44–1.00)
Chloride: 107 mmol/L (ref 101–111)
GFR calc Af Amer: >60 mL/min (ref >60)
GFR calc non Af Amer: >60 mL/min (ref >60)
GLUCOSE: 94 mg/dL (ref 65–99)
Potassium: 3.7 mmol/L (ref 3.5–5.1)
Sodium: 139 mmol/L (ref 135–145)

## 2017-08-20 LAB — SURGICAL PCR SCREEN
MRSA, PCR: NEGATIVE
STAPHYLOCOCCUS AUREUS: NEGATIVE

## 2017-08-21 ENCOUNTER — Telehealth: Payer: Self-pay | Admitting: *Deleted

## 2017-08-21 MED ORDER — PEG 3350-KCL-NA BICARB-NACL 420 G PO SOLR: 4000.0000 mL | Freq: Once | ORAL | 0 refills | Status: AC

## 2017-08-21 NOTE — Telephone Encounter (Signed)
Patient called in. Needing new Rx for prep sent into [facility]. She reports she already mixed her prep and he procedure is not until Thursday. New Rx sent in

## 2017-08-22 ENCOUNTER — Encounter (HOSPITAL_COMMUNITY): Payer: Self-pay

## 2017-08-22 ENCOUNTER — Ambulatory Visit (HOSPITAL_COMMUNITY)
Admission: RE | Admit: 2017-08-22 | Discharge: 2017-08-22 | Disposition: A | Discharge status: Home / Self Care | Payer: Non-veteran care | Source: Ambulatory Visit | Attending: Nurse Practitioner | Admitting: Nurse Practitioner

## 2017-08-22 DIAGNOSIS — Z01810 Encounter for preprocedural cardiovascular examination: Secondary | ICD-10-CM | POA: Insufficient documentation

## 2017-08-22 DIAGNOSIS — Z01812 Encounter for preprocedural laboratory examination: Secondary | ICD-10-CM | POA: Insufficient documentation

## 2017-08-22 DIAGNOSIS — R9431 Abnormal electrocardiogram [ECG] [EKG]: Secondary | ICD-10-CM | POA: Insufficient documentation

## 2017-08-22 DIAGNOSIS — Z1231 Encounter for screening mammogram for malignant neoplasm of breast: Secondary | ICD-10-CM

## 2017-08-23 ENCOUNTER — Ambulatory Visit (HOSPITAL_COMMUNITY): Payer: Non-veteran care | Admitting: Anesthesiology

## 2017-08-23 ENCOUNTER — Encounter (HOSPITAL_COMMUNITY)
Admission: RE | Disposition: A | Discharge status: Home / Self Care | Payer: Self-pay | Source: Ambulatory Visit | Attending: Internal Medicine

## 2017-08-23 ENCOUNTER — Ambulatory Visit (HOSPITAL_COMMUNITY)
Admission: RE | Admit: 2017-08-23 | Discharge: 2017-08-23 | Disposition: A | Discharge status: Home / Self Care | Payer: Non-veteran care | Source: Ambulatory Visit | Attending: Internal Medicine | Admitting: Internal Medicine

## 2017-08-23 ENCOUNTER — Encounter (HOSPITAL_COMMUNITY): Payer: Self-pay | Admitting: *Deleted

## 2017-08-23 DIAGNOSIS — I10 Essential (primary) hypertension: Secondary | ICD-10-CM | POA: Insufficient documentation

## 2017-08-23 DIAGNOSIS — K2210 Ulcer of esophagus without bleeding: Secondary | ICD-10-CM | POA: Diagnosis not present

## 2017-08-23 DIAGNOSIS — F329 Major depressive disorder, single episode, unspecified: Secondary | ICD-10-CM | POA: Diagnosis not present

## 2017-08-23 DIAGNOSIS — K5909 Other constipation: Secondary | ICD-10-CM | POA: Diagnosis not present

## 2017-08-23 DIAGNOSIS — K209 Esophagitis, unspecified: Secondary | ICD-10-CM

## 2017-08-23 DIAGNOSIS — J449 Chronic obstructive pulmonary disease, unspecified: Secondary | ICD-10-CM | POA: Diagnosis not present

## 2017-08-23 DIAGNOSIS — K641 Second degree hemorrhoids: Secondary | ICD-10-CM | POA: Diagnosis not present

## 2017-08-23 DIAGNOSIS — K921 Melena: Secondary | ICD-10-CM | POA: Diagnosis not present

## 2017-08-23 DIAGNOSIS — K210 Gastro-esophageal reflux disease with esophagitis: Secondary | ICD-10-CM | POA: Insufficient documentation

## 2017-08-23 DIAGNOSIS — Z79891 Long term (current) use of opiate analgesic: Secondary | ICD-10-CM | POA: Insufficient documentation

## 2017-08-23 DIAGNOSIS — G4730 Sleep apnea, unspecified: Secondary | ICD-10-CM | POA: Diagnosis not present

## 2017-08-23 DIAGNOSIS — Z87891 Personal history of nicotine dependence: Secondary | ICD-10-CM | POA: Insufficient documentation

## 2017-08-23 DIAGNOSIS — Z79899 Other long term (current) drug therapy: Secondary | ICD-10-CM | POA: Insufficient documentation

## 2017-08-23 DIAGNOSIS — F419 Anxiety disorder, unspecified: Secondary | ICD-10-CM | POA: Diagnosis not present

## 2017-08-23 DIAGNOSIS — R1310 Dysphagia, unspecified: Secondary | ICD-10-CM | POA: Diagnosis not present

## 2017-08-23 DIAGNOSIS — D649 Anemia, unspecified: Secondary | ICD-10-CM | POA: Diagnosis not present

## 2017-08-23 DIAGNOSIS — M797 Fibromyalgia: Secondary | ICD-10-CM | POA: Insufficient documentation

## 2017-08-23 HISTORY — PX: COLONOSCOPY WITH PROPOFOL: SHX5780

## 2017-08-23 HISTORY — PX: ESOPHAGOGASTRODUODENOSCOPY (EGD) WITH PROPOFOL: SHX5813

## 2017-08-23 HISTORY — PX: MALONEY DILATION: SHX5535

## 2017-08-23 SURGERY — COLONOSCOPY WITH PROPOFOL
Anesthesia: Monitor Anesthesia Care

## 2017-08-23 MED ORDER — LACTATED RINGERS IV SOLN
INTRAVENOUS | Status: DC
  Administered 2017-08-23: 11:00:00 via INTRAVENOUS

## 2017-08-23 MED ORDER — PROPOFOL 10 MG/ML IV BOLUS
INTRAVENOUS | Status: DC | PRN
  Administered 2017-08-23 (×2): 20 mg via INTRAVENOUS
  Administered 2017-08-23: 10 mg via INTRAVENOUS

## 2017-08-23 MED ORDER — LIDOCAINE VISCOUS 2 % MT SOLN
5.0000 mL | Freq: Once | OROMUCOSAL | Status: AC
  Administered 2017-08-23: 5 mL via OROMUCOSAL

## 2017-08-23 MED ORDER — MIDAZOLAM HCL 2 MG/2ML IJ SOLN
INTRAMUSCULAR | Status: AC
  Filled 2017-08-23: qty 2

## 2017-08-23 MED ORDER — PROPOFOL 10 MG/ML IV BOLUS
INTRAVENOUS | Status: AC
  Filled 2017-08-23: qty 40

## 2017-08-23 MED ORDER — PROPOFOL 500 MG/50ML IV EMUL
INTRAVENOUS | Status: DC | PRN
  Administered 2017-08-23 (×2): 150 ug/kg/min via INTRAVENOUS
  Administered 2017-08-23: 175 ug/kg/min via INTRAVENOUS

## 2017-08-23 MED ORDER — FENTANYL CITRATE (PF) 100 MCG/2ML IJ SOLN
25.0000 ug | Freq: Once | INTRAMUSCULAR | Status: AC
  Administered 2017-08-23: 25 ug via INTRAVENOUS

## 2017-08-23 MED ORDER — LIDOCAINE VISCOUS 2 % MT SOLN
OROMUCOSAL | Status: AC
  Filled 2017-08-23: qty 15

## 2017-08-23 MED ORDER — MIDAZOLAM HCL 2 MG/2ML IJ SOLN
1.0000 mg | INTRAMUSCULAR | Status: AC
  Administered 2017-08-23: 2 mg via INTRAVENOUS

## 2017-08-23 MED ORDER — FENTANYL CITRATE (PF) 100 MCG/2ML IJ SOLN
INTRAMUSCULAR | Status: AC
  Filled 2017-08-23: qty 2

## 2017-08-23 NOTE — Interval H&P Note (Signed)
History and Physical Interval Note:  08/23/2017 12:42 PM  Vicki Dean  has presented today for surgery, with the diagnosis of GERD, dysphagia, constipation, hematochezia  The various methods of treatment have been discussed with the patient and family. After consideration of risks, benefits and other options for treatment, the patient has consented to  Procedure(s) with comments: COLONOSCOPY WITH PROPOFOL (N/A) - 1:00pm ESOPHAGOGASTRODUODENOSCOPY (EGD) WITH PROPOFOL (N/A) MALONEY DILATION (N/A) as a surgical intervention .  The patient's history has been reviewed, patient examined, no change in status, stable for surgery.  I have reviewed the patient's chart and labs.  Questions were answered to the patient's satisfaction.     Vicki Dean  No change. EGD with esophageal dilation and colonoscopy per plan today.  The risks, benefits, limitations, alternatives and imponderables have been reviewed with the patient. Questions have been answered. All parties are agreeable.

## 2017-08-23 NOTE — Anesthesia Postprocedure Evaluation (Signed)
Anesthesia Post Note  Patient: Vicki Dean  Procedure(s) Performed: COLONOSCOPY WITH PROPOFOL (N/A ) ESOPHAGOGASTRODUODENOSCOPY (EGD) WITH PROPOFOL (N/A ) MALONEY DILATION (N/A )  Patient location during evaluation: PACU Anesthesia Type: MAC Level of consciousness: awake and alert and oriented Pain management: pain level controlled Vital Signs Assessment: post-procedure vital signs reviewed and stable Respiratory status: spontaneous breathing Cardiovascular status: stable and blood pressure returned to baseline Postop Assessment: no apparent nausea or vomiting Anesthetic complications: no     Last Vitals:  Vitals:   08/23/17 1245 08/23/17 1250  BP:    Pulse:    Resp:    Temp:    SpO2: 98% 98%    Last Pain:  Vitals:   08/23/17 1008  TempSrc: Oral                 Diyana Starrett

## 2017-08-23 NOTE — Anesthesia Preprocedure Evaluation (Signed)
Anesthesia Evaluation  Patient identified by MRN, date of birth, ID band Patient awake    Reviewed: Allergy & Precautions, NPO status , Patient's Chart, lab work & pertinent test results  Airway Mallampati: II  TM Distance: >3 FB Neck ROM: Full    Dental  (+) Teeth Intact   Pulmonary sleep apnea , COPD, former smoker,    breath sounds clear to auscultation       Cardiovascular hypertension, Pt. on medications  Rhythm:Regular Rate:Normal     Neuro/Psych  Headaches, PSYCHIATRIC DISORDERS Anxiety Depression    GI/Hepatic GERD  Medicated,  Endo/Other    Renal/GU      Musculoskeletal  (+) Fibromyalgia -  Abdominal   Peds  Hematology  (+) anemia ,   Anesthesia Other Findings   Reproductive/Obstetrics                             Anesthesia Physical Anesthesia Plan  ASA: III  Anesthesia Plan: MAC   Post-op Pain Management:    Induction: Intravenous  PONV Risk Score and Plan:   Airway Management Planned: Simple Face Mask  Additional Equipment:   Intra-op Plan:   Post-operative Plan:   Informed Consent: I have reviewed the patients History and Physical, chart, labs and discussed the procedure including the risks, benefits and alternatives for the proposed anesthesia with the patient or authorized representative who has indicated his/her understanding and acceptance.     Plan Discussed with:   Anesthesia Plan Comments:         Anesthesia Quick Evaluation

## 2017-08-23 NOTE — Op Note (Signed)
[facility] Patient Name: Vicki Dean Procedure Date: 08/23/2017 1:11 PM MRN: [id number] Date of Birth: [birth year] Attending MD: Gennette Pac , MD CSN: [id number] Age: 57 Admit Type: Outpatient Procedure:                Colonoscopy Indications:              Hematochezia Providers:                Gennette Pac, MD, Nena Polio, RN, Dyann Ruddle Referring MD:              Medicines:                Propofol per Anesthesia Complications:            No immediate complications. Estimated Blood Loss:     Estimated blood loss: none. Procedure:                Pre-Anesthesia Assessment:                           - Prior to the procedure, a History and Physical                            was performed, and patient medications and                            allergies were reviewed. The patient's tolerance of                            previous anesthesia was also reviewed. The risks                            and benefits of the procedure and the sedation                            options and risks were discussed with the patient.                            All questions were answered, and informed consent                            was obtained. Prior Anticoagulants: The patient has                            taken no previous anticoagulant or antiplatelet                            agents. ASA Grade Assessment: III - A patient with                            severe systemic disease. After reviewing the risks                            and benefits,  the patient was deemed in                            satisfactory condition to undergo the procedure.                           After obtaining informed consent, the colonoscope                            was passed under direct vision. Throughout the                            procedure, the patient's blood pressure, pulse, and                            oxygen saturations were monitored continuously.  The                            [id number] ([id number] scope was introduced through                            the and advanced to the the cecum, identified by                            appendiceal orifice and ileocecal valve. The                            colonoscopy was performed without difficulty. The                            patient tolerated the procedure well. The quality                            of the bowel preparation was adequate. The entire                            colon was well visualized. The ileocecal valve,                            appendiceal orifice, and rectum were photographed. Scope In: 1:14:56 PM Scope Out: 1:26:09 PM Scope Withdrawal Time: 0 hours 6 minutes 18 seconds  Total Procedure Duration: 0 hours 11 minutes 13 seconds  Findings:      The colon (entire examined portion) appeared normal.      Internal hemorrhoids were found during retroflexion. The hemorrhoids       were Grade II (internal hemorrhoids that prolapse but reduce       spontaneously).      The exam was otherwise without abnormality on direct and retroflexion       views. Impression:               - The entire examined colon is normal.                           - Internal hemorrhoids.                           -  The examination was otherwise normal on direct                            and retroflexion views.                           - No specimens collected. Moderate Sedation:      Moderate (conscious) sedation was administered by the endoscopy nurse       and supervised by the endoscopist. The following parameters were       monitored: oxygen saturation, heart rate, blood pressure, respiratory       rate, EKG, adequacy of pulmonary ventilation, and response to care.       Total physician intraservice time was 28 minutes. Recommendation:           - Patient has a contact number available for                            emergencies. The signs and symptoms of potential                             delayed complications were discussed with the                            patient. Return to normal activities tomorrow.                            Written discharge instructions were provided to the                            patient.                           - Resume previous diet.                           - Continue present medications. course of Anusol                            cream for probable hemorrhoidal bleeding.                           - Repeat colonoscopy in 10 years for screening                            purposes.                           - Return to GI clinic in 3 months. See EGD report. Procedure Code(s):        --- Professional ---                           [phone], Colonoscopy, flexible; diagnostic, including                            collection of specimen(s) by brushing or washing,  when performed (separate procedure)                           99152, Moderate sedation services provided by the                            same physician or other qualified health care                            professional performing the diagnostic or                            therapeutic service that the sedation supports,                            requiring the presence of an independent trained                            observer to assist in the monitoring of the                            patient's level of consciousness and physiological                            status; initial 15 minutes of intraservice time,                            patient age 53 years or older                           [phone], Moderate sedation services; each additional                            15 minutes intraservice time Diagnosis Code(s):        --- Professional ---                           K64.1, Second degree hemorrhoids                           K92.1, Melena (includes Hematochezia) CPT copyright 2016 American Medical Association. All rights reserved. The codes  documented in this report are preliminary and upon coder review may  be revised to meet current compliance requirements. Gerrit Friends. Vi Biddinger, MD Gennette Pac, MD 08/23/2017 1:34:58 PM This report has been signed electronically. Number of Addenda: 0

## 2017-08-23 NOTE — Discharge Instructions (Addendum)
PATIENT INSTRUCTIONS POST-ANESTHESIA  IMMEDIATELY FOLLOWING SURGERY:  Do not drive or operate machinery for the first twenty four hours after surgery.  Do not make any important decisions for twenty four hours after surgery or while taking narcotic pain medications or sedatives.  If you develop intractable nausea and vomiting or a severe headache please notify your doctor immediately.  FOLLOW-UP:  Please make an appointment with your surgeon as instructed. You do not need to follow up with anesthesia unless specifically instructed to do so.  WOUND CARE INSTRUCTIONS (if applicable):  Keep a dry clean dressing on the anesthesia/puncture wound site if there is drainage.  Once the wound has quit draining you may leave it open to air.  Generally you should leave the bandage intact for twenty four hours unless there is drainage.  If the epidural site drains for more than 36-48 hours please call the anesthesia department.  QUESTIONS?:  Please feel free to call your physician or the hospital operator if you have any questions, and they will be happy to assist you.       Colonoscopy Discharge Instructions  Read the instructions outlined below and refer to this sheet in the next few weeks. These discharge instructions provide you with general information on caring for yourself after you leave the hospital. Your doctor may also give you specific instructions. While your treatment has been planned according to the most current medical practices available, unavoidable complications occasionally occur. If you have any problems or questions after discharge, call Dr. Gala Romney at [phone]. ACTIVITY  You may resume your regular activity, but move at a slower pace for the next 24 hours.   Take frequent rest periods for the next 24 hours.   Walking will help get rid of the air and reduce the bloated feeling in your belly (abdomen).   No driving for 24 hours (because of the medicine (anesthesia) used during the  test).    Do not sign any important legal documents or operate any machinery for 24 hours (because of the anesthesia used during the test).  NUTRITION  Drink plenty of fluids.   You may resume your normal diet as instructed by your doctor.   Begin with a light meal and progress to your normal diet. Heavy or fried foods are harder to digest and may make you feel sick to your stomach (nauseated).   Avoid alcoholic beverages for 24 hours or as instructed.  MEDICATIONS  You may resume your normal medications unless your doctor tells you otherwise.  WHAT YOU CAN EXPECT TODAY  Some feelings of bloating in the abdomen.   Passage of more gas than usual.   Spotting of blood in your stool or on the toilet paper.  IF YOU HAD POLYPS REMOVED DURING THE COLONOSCOPY:  No aspirin products for 7 days or as instructed.   No alcohol for 7 days or as instructed.   Eat a soft diet for the next 24 hours.  FINDING OUT THE RESULTS OF YOUR TEST Not all test results are available during your visit. If your test results are not back during the visit, make an appointment with your caregiver to find out the results. Do not assume everything is normal if you have not heard from your caregiver or the medical facility. It is important for you to follow up on all of your test results.  SEEK IMMEDIATE MEDICAL ATTENTION IF:  You have more than a spotting of blood in your stool.   Your belly is swollen (  abdominal distention).   You are nauseated or vomiting.   You have a temperature over 101.   You have abdominal pain or discomfort that is severe or gets worse throughout the day.  The risks, benefits, limitations, alternatives and imponderables have been reviewed with the patient. Potential for esophageal dilation, biopsy, etc. have also been reviewed.  Questions have been answered. All parties agreeable.   Hemorrhoid information provided  Anusol HC cream as directed  Office visit with us in 3  months  Screening colonoscopy in 10 years   Hemorrhoids Hemorrhoids are swollen veins in and around the rectum or anus. Hemorrhoids can cause pain, itching, or bleeding. Most of the time, they do not cause serious problems. They usually get better with diet changes, lifestyle changes, and other home treatments. Follow these instructions at home: Eating and drinking  Eat foods that have fiber, such as whole grains, beans, nuts, fruits, and vegetables. Ask your doctor about taking products that have added fiber (fibersupplements).  Drink enough fluid to keep your pee (urine) clear or pale yellow. For Pain and Swelling  Take a warm-water bath (sitz bath) for 20 minutes to ease pain. Do this 3-4 times a day.  If directed, put ice on the painful area. It may be helpful to use ice between your warm baths. ? Put ice in a plastic bag. ? Place a towel between your skin and the bag. ? Leave the ice on for 20 minutes, 2-3 times a day. General instructions  Take over-the-counter and prescription medicines only as told by your doctor. ? Medicated creams and medicines that are inserted into the anus (suppositories) may be used or applied as told.  Exercise often.  Go to the bathroom when you have the urge to poop (to have a bowel movement). Do not wait.  Avoid pushing too hard (straining) when you poop.  Keep the butt area dry and clean. Use wet toilet paper or moist paper towels.  Do not sit on the toilet for a long time. Contact a doctor if:  You have any of these: ? Pain and swelling that do not get better with treatment or medicine. ? Bleeding that will not stop. ? Trouble pooping or you cannot poop. ? Pain or swelling outside the area of the hemorrhoids. This information is not intended to replace advice given to you by your health care provider. Make sure you discuss any questions you have with your health care provider. Document Released: 03/14/2008 Document Revised: 11/11/2015  Document Reviewed: 02/17/2015 Elsevier Interactive Patient Education  [employer]2018 [employer]

## 2017-08-23 NOTE — Transfer of Care (Signed)
Immediate Anesthesia Transfer of Care Note  Patient: Vicki Dean  Procedure(s) Performed: COLONOSCOPY WITH PROPOFOL (N/A ) ESOPHAGOGASTRODUODENOSCOPY (EGD) WITH PROPOFOL (N/A ) MALONEY DILATION (N/A )  Patient Location: PACU  Anesthesia Type:MAC  Level of Consciousness: awake  Airway & Oxygen Therapy: Patient Spontanous Breathing and Patient connected to nasal cannula oxygen  Post-op Assessment: Report given to RN  Post vital signs: Reviewed and stable  Last Vitals:  Vitals:   08/23/17 1245 08/23/17 1250  BP:    Pulse:    Resp:    Temp:    SpO2: 98% 98%    Last Pain:  Vitals:   08/23/17 1008  TempSrc: Oral      Patients Stated Pain Goal: 7 (74/14/23 9532)  Complications: No apparent anesthesia complications

## 2017-08-23 NOTE — Op Note (Signed)
[facility] Patient Name: Vicki Dean Procedure Date: 08/23/2017 12:02 PM MRN: [id number] Date of Birth: April 19, 1961 Attending MD: Gennette Pac , MD CSN: [id number] Age: 57 Admit Type: Outpatient Procedure:                Upper GI endoscopy Indications:              Dysphagia Providers:                Gennette Pac, MD, Nena Polio, RN, Dyann Ruddle Referring MD:              Medicines:                Propofol per Anesthesia Complications:            No immediate complications. Estimated Blood Loss:     Estimated blood loss: none. Procedure:                Pre-Anesthesia Assessment:                           - Prior to the procedure, a History and Physical                            was performed, and patient medications and                            allergies were reviewed. The patient's tolerance of                            previous anesthesia was also reviewed. The risks                            and benefits of the procedure and the sedation                            options and risks were discussed with the patient.                            All questions were answered, and informed consent                            was obtained. Prior Anticoagulants: The patient has                            taken no previous anticoagulant or antiplatelet                            agents. ASA Grade Assessment: III - A patient with                            severe systemic disease. After reviewing the risks  and benefits, the patient was deemed in                            satisfactory condition to undergo the procedure.                           After obtaining informed consent, the endoscope was                            passed under direct vision. Throughout the                            procedure, the patient's blood pressure, pulse, and                            oxygen saturations were monitored  continuously. The                            [id number] ([id number] scope was introduced through the                            and advanced to the second part of duodenum. The                            upper GI endoscopy was accomplished without                            difficulty. Scope In: 1:05:00 PM Scope Out: 1:09:16 PM Total Procedure Duration: 0 hours 4 minutes 16 seconds  Findings:      Esophagitis was found. 3 mm erosions straddling the EG junction. Tubular       esophagus otheeared normal.      The entire examined stomach was normal.      The duodenal bulb and second portion of the duodenum were normal. The       scope was withdrawn. Dilation was performed with a Maloney dilator with       mild resistance at 56 Fr. The dilation site was examined following       endoscope reinsertion and showed no change. Estimated blood loss: none. Impression:               - mild erosive reflux esophagitis. Dilated.                           - Normal stomach.                           - Normal duodenal bulb and second portion of the                            duodenum.                           - No specimens collected. Moderate Sedation:      Moderate (conscious) sedation was personally administered by an       anesthesia professional. The following parameters were monitored: oxygen  saturation, heart rate, blood pressure, respiratory rate, EKG, adequacy       of pulmonary ventilation, and response to care. Total physician       intraservice time was 11 minutes. Recommendation:           - Patient has a contact number available for                            emergencies. The signs and symptoms of potential                            delayed complications were discussed with the                            patient. Return to normal activities tomorrow.                            Written discharge instructions were provided to the                            patient.                            - Resume previous diet. Continue Protonix 40 mg                            daily. Stop omeprazole. See colonoscopy report.                           - Continue present medications.                           - No repeat upper endoscopy.                           - Return to GI office in 3 months. Procedure Code(s):        --- Professional ---                           [phone], Esophagogastroduodenoscopy, flexible,                            transoral; diagnostic, including collection of                            specimen(s) by brushing or washing, when performed                            (separate procedure)                           43450, Dilation of esophagus, by unguided sound or                            bougie, single or multiple passes Diagnosis Code(s):        --- Professional ---  K20.9, Esophagitis, unspecified                           R13.10, Dysphagia, unspecified CPT copyright 2016 American Medical Association. All rights reserved. The codes documented in this report are preliminary and upon coder review may  be revised to meet current compliance requirements. Gerrit Friends. Alleyne Lac, MD Gennette Pac, MD 08/23/2017 1:29:14 PM This report has been signed electronically. Number of Addenda: 0

## 2017-08-24 ENCOUNTER — Telehealth: Payer: Self-pay | Admitting: Internal Medicine

## 2017-08-24 NOTE — Telephone Encounter (Signed)
Pt seen recently. She has used her Linzess samples and wanted to know could [facility] so she can get it for free. Please advise and call her at [phone]

## 2017-08-28 ENCOUNTER — Telehealth: Payer: Self-pay | Admitting: Internal Medicine

## 2017-08-28 ENCOUNTER — Encounter (HOSPITAL_COMMUNITY): Payer: Self-pay | Admitting: Internal Medicine

## 2017-08-28 DIAGNOSIS — K581 Irritable bowel syndrome with constipation: Secondary | ICD-10-CM

## 2017-08-28 DIAGNOSIS — R1312 Dysphagia, oropharyngeal phase: Secondary | ICD-10-CM

## 2017-08-28 DIAGNOSIS — K219 Gastro-esophageal reflux disease without esophagitis: Secondary | ICD-10-CM

## 2017-08-28 DIAGNOSIS — K921 Melena: Secondary | ICD-10-CM

## 2017-08-28 MED ORDER — LINACLOTIDE 72 MCG PO CAPS: 72.0000 ug | ORAL_CAPSULE | Freq: Every day | ORAL | 3 refills | Status: DC

## 2017-08-28 NOTE — Telephone Encounter (Signed)
Routing message 

## 2017-08-28 NOTE — Addendum Note (Signed)
Addended by: Tiffany KocherLEWIS, Vidur Knust S on: 08/28/2017 03:08 PM   Modules accepted: Orders

## 2017-08-28 NOTE — Telephone Encounter (Signed)
Helmut Musterlicia, RX done but needs to be phoned in.

## 2017-08-28 NOTE — Telephone Encounter (Signed)
Tried calling RX in. Linzess is not on the formulary. Office notes and a written rx will have to be faxed to [phone]. Will print notes out and fax.

## 2017-08-28 NOTE — Telephone Encounter (Signed)
[phone] PATIENT CALLED AND STATED THAT SHE WOULD LIKE A PRESCRIPTION OF [employer] SENT TO THE [facility] [place], [phone].  THEY NEED US TO CALL THEM AND LET THEM KNOW ABOUT THE PRESCRIPTION SO THEY CAN GET IT FOR THE PATIENT.

## 2017-10-08 ENCOUNTER — Emergency Department (HOSPITAL_COMMUNITY)
Admission: EM | Admit: 2017-10-08 | Discharge: 2017-10-08 | Disposition: A | Discharge status: Home / Self Care | Payer: Non-veteran care | Attending: Emergency Medicine | Admitting: Emergency Medicine

## 2017-10-08 ENCOUNTER — Other Ambulatory Visit: Payer: Self-pay

## 2017-10-08 ENCOUNTER — Encounter (HOSPITAL_COMMUNITY): Payer: Self-pay | Admitting: *Deleted

## 2017-10-08 DIAGNOSIS — I10 Essential (primary) hypertension: Secondary | ICD-10-CM | POA: Diagnosis not present

## 2017-10-08 DIAGNOSIS — J40 Bronchitis, not specified as acute or chronic: Secondary | ICD-10-CM | POA: Diagnosis not present

## 2017-10-08 DIAGNOSIS — J029 Acute pharyngitis, unspecified: Secondary | ICD-10-CM | POA: Insufficient documentation

## 2017-10-08 DIAGNOSIS — J449 Chronic obstructive pulmonary disease, unspecified: Secondary | ICD-10-CM | POA: Diagnosis not present

## 2017-10-08 DIAGNOSIS — Z79899 Other long term (current) drug therapy: Secondary | ICD-10-CM | POA: Diagnosis not present

## 2017-10-08 DIAGNOSIS — Z87891 Personal history of nicotine dependence: Secondary | ICD-10-CM | POA: Insufficient documentation

## 2017-10-08 DIAGNOSIS — R05 Cough: Secondary | ICD-10-CM | POA: Diagnosis present

## 2017-10-08 MED ORDER — AZITHROMYCIN 250 MG PO TABS: 250.0000 mg | ORAL_TABLET | Freq: Every day | ORAL | 0 refills | Status: DC

## 2017-10-08 MED ORDER — BENZONATATE 100 MG PO CAPS: 100.0000 mg | ORAL_CAPSULE | Freq: Three times a day (TID) | ORAL | 0 refills | Status: DC

## 2017-10-08 NOTE — Discharge Instructions (Signed)
Your evaluated in the emergency department for a week's worth of cough and sore throat with associated chills.  This is likely an upper respiratory infection and we are prescribing you an antibiotic and some cough suppressant.  He should continue Tylenol or ibuprofen for pain and the warm salt water gargles.  Try to keep well-hydrated.  Follow-up with your doctor return if any worsening symptoms.

## 2017-10-08 NOTE — ED Provider Notes (Signed)
[facility] EMERGENCY DEPARTMENT Provider Note   CSN: [id number] Arrival date & time: 10/08/17  1157     History   Chief Complaint Chief Complaint  Patient presents with  . Sore Throat  . Cough    HPI Vicki Dean is a 57 y.o. female.  57 year old female complaining of cough productive of yellow sputum associated with sore throat and chills.  This is been going on about a week.  She has been using warm salt water gargles with some relief.  There is been no fever that she knows of.  No sick contacts no recent travel.  She is noticed some wheezing but she does have COPD and she is been using her inhalers.  The history is provided by the patient.  Sore Throat  This is a new problem. The current episode started more than 1 week ago. The problem occurs constantly. The problem has been gradually worsening. Pertinent negatives include no chest pain, no abdominal pain and no shortness of breath. Associated symptoms comments: cough. The symptoms are aggravated by coughing and swallowing. Nothing relieves the symptoms. She has tried acetaminophen for the symptoms. The treatment provided no relief.  Cough  This is a new problem. The current episode started more than 1 week ago. The cough is productive of purulent sputum. There has been no fever. Associated symptoms include chills, rhinorrhea and sore throat. Pertinent negatives include no chest pain, no ear pain and no shortness of breath. The treatment provided no relief. She is not a smoker. Her past medical history is significant for bronchitis and COPD.    Past Medical History:  Diagnosis Date  . Anxiety   . Arthritis    possibly her right shoulder.  . Chronic back pain   . COPD (chronic obstructive pulmonary disease) (HCC)   . Fibromyalgia   . GERD (gastroesophageal reflux disease)   . Hypertension   . Sleep apnea    could not tolerate    Patient Active Problem List   Diagnosis Date Noted  . Hematochezia 07/31/2017  .  Dysphagia 07/31/2017  . ANEMIA-NOS 07/06/2006  . DEPRESSION 07/06/2006  . COMMON MIGRAINE 07/06/2006  . HYPERTENSION 07/06/2006  . GERD 07/06/2006  . IRRITABLE BOWEL SYNDROME 07/06/2006  . INFECTION, URINARY TRACT NOS 07/06/2006  . DYSFUNCTIONAL UTERINE BLEEDING 07/06/2006  . LOW BACK PAIN 07/06/2006    Past Surgical History:  Procedure Laterality Date  . ABDOMINAL HYSTERECTOMY    . CARPAL TUNNEL RELEASE Left   . COLONOSCOPY WITH PROPOFOL N/A 08/23/2017   Procedure: COLONOSCOPY WITH PROPOFOL;  Surgeon: Corbin Ade, MD;  Location: [facility];  Service: Endoscopy;  Laterality: N/A;  1:00pm  . ESOPHAGOGASTRODUODENOSCOPY (EGD) WITH PROPOFOL N/A 08/23/2017   Procedure: ESOPHAGOGASTRODUODENOSCOPY (EGD) WITH PROPOFOL;  Surgeon: Corbin Ade, MD;  Location: [facility];  Service: Endoscopy;  Laterality: N/A;  . FOOT SURGERY Right    screws from fracture  . GANGLION CYST EXCISION Right    foot  . KNEE SURGERY Right    arthroscopy  . MALONEY DILATION N/A 08/23/2017   Procedure: Elease Hashimoto DILATION;  Surgeon: Corbin Ade, MD;  Location: [facility];  Service: Endoscopy;  Laterality: N/A;     OB History    Gravida  3   Para  2   Term  2   Preterm      AB  1   Living        SAB  1   TAB  Ectopic      Multiple      Live Births               Home Medications    Prior to Admission medications   Medication Sig Start Date End Date Taking? Authorizing Provider  albuterol (PROVENTIL HFA;VENTOLIN HFA) 108 (90 Base) MCG/ACT inhaler Inhale 1-2 puffs into the lungs every 6 (six) hours as needed for wheezing or shortness of breath.    [provider]  amLODipine (NORVASC) 10 MG tablet Take 10 mg by mouth daily.    [provider]  budesonide-formoterol (SYMBICORT) 160-4.5 MCG/ACT inhaler Inhale 2 puffs into the lungs 2 (two) times daily as needed (shortness of breath).     [provider]  cyclobenzaprine (FLEXERIL) 10 MG tablet Take 1  tablet (10 mg total) by mouth 3 (three) times daily. Patient taking differently: Take 10 mg by mouth 3 (three) times daily as needed.  07/25/17   Ivery Quale, PA-C  dexamethasone (DECADRON) 4 MG tablet Take 1 tablet (4 mg total) by mouth 2 (two) times daily with a meal. 07/25/17   Ivery Quale, PA-C  hydrochlorothiazide (HYDRODIURIL) 25 MG tablet Take 12.5 mg by mouth daily.     [provider]  ibuprofen (ADVIL,MOTRIN) 600 MG tablet Take 1 tablet (600 mg total) by mouth 4 (four) times daily. Patient not taking: Reported on 08/08/2017 07/25/17   Ivery Quale, PA-C  linaclotide [facility]) 72 MCG capsule Take 1 capsule (72 mcg total) by mouth daily before breakfast. 08/28/17   Tiffany Kocher, PA-C  omeprazole (PRILOSEC) 20 MG capsule Take 20 mg by mouth daily.    [provider]  pantoprazole (PROTONIX) 40 MG tablet Take 1 tablet (40 mg total) by mouth daily. 07/31/17   Anice Paganini, NP  traMADol (ULTRAM) 50 MG tablet Take 1 tablet (50 mg total) by mouth every 6 (six) hours as needed. Patient not taking: Reported on 08/08/2017 02/12/17   Gilda Crease, MD  traZODone (DESYREL) 100 MG tablet Take 100 mg by mouth at bedtime as needed for sleep.    [provider]  venlafaxine XR (EFFEXOR-XR) 75 MG 24 hr capsule Take 75 mg by mouth daily with breakfast.     [provider]    Family History Family History  Problem Relation Age of Onset  . Hypertension Mother   . Diabetes Mother   . Arthritis Mother   . Hypercholesterolemia Mother   . Emphysema Father   . Cancer Other   . Diabetes Other   . Colon cancer Neg Hx   . Gastric cancer Neg Hx   . Esophageal cancer Neg Hx     Social History Social History   Tobacco Use  . Smoking status: Former Smoker    Packs/day: 1.00    Years: 20.00    Pack years: 20.00    Types: Cigarettes    Last attempt to quit: 08/20/2013    Years since quitting: 4.1  . Smokeless tobacco: Never Used  Substance Use Topics  .  Alcohol use: No    Frequency: Never    Comment: in the past  . Drug use: No     Allergies   Doxepin; Lisinopril; and Pneumococcal vaccines   Review of Systems Review of Systems  Constitutional: Positive for chills. Negative for fever.  HENT: Positive for rhinorrhea and sore throat. Negative for ear pain.   Eyes: Negative for pain and visual disturbance.  Respiratory: Positive for cough. Negative for shortness  of breath.   Cardiovascular: Negative for chest pain and palpitations.  Gastrointestinal: Negative for abdominal pain and vomiting.  Genitourinary: Negative for dysuria and hematuria.  Musculoskeletal: Negative for arthralgias and back pain.  Skin: Negative for color change and rash.  All other systems reviewed and are negative.    Physical Exam Updated Vital Signs BP (!) 156/94 (BP Location: Right Arm)   Pulse 100   Temp (!) 97.3 F (36.3 C) (Tympanic)   Resp 20   Ht 5\' 7"  (1.702 m)   Wt 98.9 kg (218 lb)   SpO2 99%   BMI 34.14 kg/m   Physical Exam  Constitutional: She appears well-developed and well-nourished.  HENT:  Head: Normocephalic and atraumatic.  Right Ear: Tympanic membrane normal. No drainage.  Left Ear: Tympanic membrane normal. No drainage.  Mouth/Throat: Mucous membranes are normal. Posterior oropharyngeal erythema present. No oropharyngeal exudate, posterior oropharyngeal edema or tonsillar abscesses.  Eyes: Pupils are equal, round, and reactive to light. Conjunctivae and EOM are normal.  Neck: Normal range of motion. Neck supple.  Cardiovascular: Regular rhythm and normal heart sounds.  Pulmonary/Chest: Effort normal and breath sounds normal. She has no wheezes.  Abdominal: Soft. She exhibits no mass. There is no tenderness.  Lymphadenopathy:    She has no cervical adenopathy.  Neurological: She is alert. GCS eye subscore is 4. GCS verbal subscore is 5. GCS motor subscore is 6.  Skin: Skin is warm and dry. Capillary refill takes less than 2  seconds.  Psychiatric: She has a normal mood and affect.     ED Treatments / Results  Labs (all labs ordered are listed, but only abnormal results are displayed) Labs Reviewed - No data to display  EKG None  Radiology No results found.  Procedures Procedures (including critical care time)  Medications Ordered in ED Medications - No data to display   Initial Impression / Assessment and Plan / ED Course  I have reviewed the triage vital signs and the nursing notes.  Pertinent labs & imaging results that were available during my care of the patient were reviewed by me and considered in my medical decision making (see chart for details).     Final Clinical Impressions(s) / ED Diagnoses   Final diagnoses:  Bronchitis    ED Discharge Orders        Ordered    azithromycin (ZITHROMAX) 250 MG tablet  Daily     10/08/17 1316    benzonatate (TESSALON) 100 MG capsule  Every 8 hours     10/08/17 1316       Terrilee FilesButler, Sherma Vanmetre C, MD 10/09/17 1101

## 2017-10-08 NOTE — ED Triage Notes (Signed)
Pt c/o sore throat, thick yellow phlegm when coughing, headache, chest soreness with coughing, chills x 1 week.

## 2017-10-17 ENCOUNTER — Encounter: Payer: Self-pay | Admitting: Nurse Practitioner

## 2017-10-25 ENCOUNTER — Encounter: Payer: Self-pay | Admitting: Gastroenterology

## 2017-10-25 ENCOUNTER — Ambulatory Visit (INDEPENDENT_AMBULATORY_CARE_PROVIDER_SITE_OTHER): Payer: Non-veteran care | Admitting: Gastroenterology

## 2017-10-25 VITALS — BP 119/77 | HR 69 | Temp 98.3°F | Ht 67.0 in | Wt 223.2 lb

## 2017-10-25 DIAGNOSIS — K5900 Constipation, unspecified: Secondary | ICD-10-CM

## 2017-10-25 DIAGNOSIS — K2100 Gastro-esophageal reflux disease with esophagitis, without bleeding: Secondary | ICD-10-CM

## 2017-10-25 DIAGNOSIS — D509 Iron deficiency anemia, unspecified: Secondary | ICD-10-CM

## 2017-10-25 NOTE — Patient Instructions (Signed)
1. We will find out from the [place] if we can work up your anemia.  2. Please call with complete list of your current medications (ALL prescription and OTC medications) so we can make recommendations regarding your constipation.

## 2017-10-25 NOTE — Assessment & Plan Note (Signed)
Found at time of pre-op labs. Baseline unavailable. Will need further work up but pending [facility] approval.

## 2017-10-25 NOTE — Progress Notes (Signed)
      Primary Care Physician: [facility]  Primary Gastroenterologist:  Roetta Sessions, MD   Chief Complaint  Patient presents with  . Gastroesophageal Reflux  . Constipation    Linzess not helping    HPI: Vicki Dean is a 57 y.o. female here for follow-up of GERD and constipation.  She was seen back in February 2019, referral from the [facility].  She has had chronic constipation for decades.  She was tried on Linzess 72 mcg daily as well as Protonix 40 mg daily. EGD in March showed mild reflux esophagitis.  She was advised to stop omeprazole at the time but continue Protonix 40 mg daily.  Esophagus was dilated due to history of dysphagia.  She was noted to have grade 2 hemorrhoids on colonoscopy.  Pre-op labs on August 20, 2017 showed hemoglobin 11.1, hematocrit 34.9, MCV 66.5.  She states Linzess did not help. She still goes 4-5 days with a BM. Has to strain. Causes rectal bleeding. Currently not taking anything to help with constipation. She does not know what medications she is on and she will have to call with her complete medication list. She denies abd pain. Some reflux. No vomiting.   MEDICATION LIST UNAVAILABLE AT TIME OF OFFICE VISIT.   Allergies as of 10/25/2017 - Review Complete 10/25/2017  Allergen Reaction Noted  . Doxepin  07/31/2017  . Lisinopril  06/28/2017  . Pneumococcal vaccines  07/31/2017    ROS:  General: Negative for anorexia, weight loss, fever, chills, fatigue, weakness. ENT: Negative for hoarseness, difficulty swallowing , nasal congestion. CV: Negative for chest pain, angina, palpitations, dyspnea on exertion, peripheral edema.  Respiratory: Negative for dyspnea at rest, dyspnea on exertion, cough, sputum, wheezing.  GI: See history of present illness. GU:  Negative for dysuria, hematuria, urinary incontinence, urinary frequency, nocturnal urination.  Endo: Negative for unusual weight change.    Physical Examination:   BP  119/77   Pulse 69   Temp 98.3 F (36.8 C) (Oral)   Ht  (1.702 m)   Wt 223 lb 3.2 oz (101.2 kg)   BMI 34.96 kg/m   General: Well-nourished, well-developed in no acute distress.  Eyes: No icterus. Mouth: Oropharyngeal mucosa moist and pink , no lesions erythema or exudate. Lungs: Clear to auscultation bilaterally.  Heart: Regular rate and rhythm, no murmurs rubs or gallops.  Abdomen: Bowel sounds are normal, nontender, nondistended, no hepatosplenomegaly or masses, no abdominal bruits or hernia , no rebound or guarding.   Extremities: No lower extremity edema. No clubbing or deformities. Neuro: Alert and oriented x 4   Skin: Warm and dry, no jaundice.   Psych: Alert and cooperative, normal mood and affect.  Labs:  Lab Results  Component Value Date   CREATININE 0.63 08/20/2017   BUN 14 08/20/2017   NA 139 08/20/2017   K 3.7 08/20/2017   CL 107 08/20/2017   CO2 23 08/20/2017   Lab Results  Component Value Date   WBC 4.2 08/20/2017   HGB 11.1 (L) 08/20/2017   HCT 34.9 (L) 08/20/2017   MCV 66.5 (L) 08/20/2017   PLT 228 08/20/2017     Imaging Studies: No results found.

## 2017-10-25 NOTE — Assessment & Plan Note (Signed)
She's not sure what she is taking. Will call with complete med list. Symptoms better. Reinforced antireflux measures.

## 2017-10-25 NOTE — Assessment & Plan Note (Signed)
Await complete list of patient's medications prior to further RX for constipation.

## 2017-10-26 ENCOUNTER — Telehealth: Payer: Self-pay | Admitting: *Deleted

## 2017-10-26 NOTE — Telephone Encounter (Signed)
Patient called to update her medication list. She read off all the meds she has and list updated.  She is asking for future rx's to be sent to the [facility]. She states some of the rx's we send in for her cost too much money reason she does not have what we prescribed. FYI to [place].

## 2017-10-26 NOTE — Progress Notes (Signed)
CC'D TO PCP °

## 2017-10-29 ENCOUNTER — Other Ambulatory Visit: Payer: Self-pay

## 2017-10-29 ENCOUNTER — Telehealth: Payer: Self-pay

## 2017-10-29 DIAGNOSIS — D509 Iron deficiency anemia, unspecified: Secondary | ICD-10-CM

## 2017-10-29 MED ORDER — LUBIPROSTONE 24 MCG PO CAPS: 24.0000 ug | ORAL_CAPSULE | Freq: Two times a day (BID) | ORAL | 3 refills | Status: DC

## 2017-10-29 NOTE — Addendum Note (Signed)
Addended by: Tiffany Kocher on: 10/29/2017 01:33 PM   Modules accepted: Orders

## 2017-10-29 NOTE — Telephone Encounter (Signed)
Noted. RX faxed to [facility] [phone].

## 2017-10-29 NOTE — Telephone Encounter (Addendum)
Please arrange for CBC, iron/ferritin/tibc.  Dx: microcytic anemia

## 2017-10-29 NOTE — Telephone Encounter (Signed)
Pt is finding out a fax number at the [facility] for the labs to be faxed to.

## 2017-10-29 NOTE — Telephone Encounter (Signed)
Verlon Au, I had paperwork on my desk from [facility], said yes per Herbert Seta, approval for labs to be done.

## 2017-10-29 NOTE — Telephone Encounter (Signed)
The prescription for the Amitiza 24 mcg was called to [facility] in [place]. I left it on [facility] for # 180 capsule to take one capsule bid with a meal. Refill x 3 from [place], [place].

## 2017-10-29 NOTE — Telephone Encounter (Signed)
Noted thanks °

## 2017-10-29 NOTE — Telephone Encounter (Signed)
Looks like RX got sent to two places because too many telephone notes opened. It should have been faxed to [facility] only per patient's request. See telephone note dated 10/26/17.  Can we cancel [facility] and make sure patient knows we sent it to [facility].

## 2017-10-29 NOTE — Telephone Encounter (Addendum)
Reviewed her medication list. Current meds as follows.    Current Outpatient Medications on File Prior to Visit  Medication Sig Dispense Refill  . albuterol (PROVENTIL HFA;VENTOLIN HFA) 108 (90 Base) MCG/ACT inhaler Inhale 1-2 puffs into the lungs every 6 (six) hours as needed for wheezing or shortness of breath.    Marland Kitchen amLODipine (NORVASC) 10 MG tablet Take 10 mg by mouth daily.    . baclofen (LIORESAL) 10 MG tablet Take 10 mg by mouth 2 (two) times daily.    . benzonatate (TESSALON) 100 MG capsule Take 100 mg by mouth every 8 (eight) hours as needed for cough.    . budesonide-formoterol (SYMBICORT) 160-4.5 MCG/ACT inhaler Inhale 2 puffs into the lungs 2 (two) times daily.     . hydrochlorothiazide (HYDRODIURIL) 25 MG tablet Take 12.5 mg by mouth daily.     Marland Kitchen lurasidone (LATUDA) 20 MG TABS tablet Take 20 mg by mouth every evening.    Marland Kitchen omeprazole (PRILOSEC) 20 MG capsule Take 20 mg by mouth daily.    . traZODone (DESYREL) 100 MG tablet Take 100 mg by mouth at bedtime as needed for sleep.    Marland Kitchen venlafaxine XR (EFFEXOR-XR) 75 MG 24 hr capsule Take 75 mg by mouth daily with breakfast.      No current facility-administered medications on file prior to visit.     PATIENT REPORTS SIGNIFICANT CONSTIPATION. LINZESS DID NOT HELP. POSSIBLY HAD TROUBLE AFFORDING AS WELL.   PLAN FOR AMITIZA BID. RX needs to be sent to [facility]. LABS AS PER OTHER TELEPHONE NOTE.

## 2017-10-30 ENCOUNTER — Ambulatory Visit: Payer: Non-veteran care | Admitting: Nurse Practitioner

## 2017-10-30 ENCOUNTER — Telehealth: Payer: Self-pay

## 2017-10-30 MED ORDER — LINACLOTIDE 290 MCG PO CAPS: 290.0000 ug | ORAL_CAPSULE | Freq: Every day | ORAL | 3 refills | Status: DC

## 2017-10-30 NOTE — Telephone Encounter (Signed)
I called and cancelled the Rx at [facility] ( spoke to [place]). Per Helmut Muster, she wrote a handwritten script and faxed to [place].

## 2017-10-30 NOTE — Telephone Encounter (Signed)
LSL, I received a call from [facility]. Amitiza isn't covered under pts plan. PT did try Linzess and that didn't help pt. Would you like office notes sent to the pharmacy as to why Linzess didn't work, or the pharmacy wants to know if you want to try a different dosage of Linzess? Please advise.

## 2017-10-30 NOTE — Addendum Note (Signed)
Addended by: Tiffany Kocher on: 10/30/2017 01:06 PM   Modules accepted: Orders

## 2017-10-30 NOTE — Telephone Encounter (Signed)
I called and got number to fax labs to. I have faxed them to [phone].   I called to let pt know and had to leave message for a return call.

## 2017-10-30 NOTE — Telephone Encounter (Signed)
Printed Rx faxed to [place] [phone]

## 2017-10-30 NOTE — Telephone Encounter (Signed)
We can try increase Linzess to daily. RX printed.   Patient reported it to be too expensive before as well but may have gotten through local pharmacy.

## 2017-10-31 ENCOUNTER — Telehealth: Payer: Self-pay | Admitting: Internal Medicine

## 2017-10-31 NOTE — Telephone Encounter (Signed)
PT is aware that the lab orders have been faxed to the [place].

## 2017-10-31 NOTE — Telephone Encounter (Signed)
Patient called to give [place] the fax number to the [facility]  (fax [phone]) She said her doctor was Dr Sula Soda. [phone]

## 2017-10-31 NOTE — Telephone Encounter (Signed)
Noted  

## 2017-10-31 NOTE — Telephone Encounter (Signed)
See separate note. Lab orders have been faxed to [facility].

## 2017-11-13 ENCOUNTER — Ambulatory Visit: Payer: Non-veteran care | Admitting: Nurse Practitioner

## 2017-11-26 ENCOUNTER — Encounter: Payer: Self-pay | Admitting: Internal Medicine

## 2017-12-04 ENCOUNTER — Encounter (HOSPITAL_COMMUNITY): Payer: Self-pay | Admitting: Emergency Medicine

## 2017-12-04 ENCOUNTER — Emergency Department (HOSPITAL_COMMUNITY)
Admission: EM | Admit: 2017-12-04 | Discharge: 2017-12-04 | Disposition: A | Discharge status: Home / Self Care | Payer: No Typology Code available for payment source | Attending: Emergency Medicine | Admitting: Emergency Medicine

## 2017-12-04 ENCOUNTER — Emergency Department (HOSPITAL_COMMUNITY): Payer: No Typology Code available for payment source

## 2017-12-04 ENCOUNTER — Other Ambulatory Visit: Payer: Self-pay

## 2017-12-04 DIAGNOSIS — J449 Chronic obstructive pulmonary disease, unspecified: Secondary | ICD-10-CM | POA: Diagnosis not present

## 2017-12-04 DIAGNOSIS — M25511 Pain in right shoulder: Secondary | ICD-10-CM | POA: Diagnosis not present

## 2017-12-04 DIAGNOSIS — I10 Essential (primary) hypertension: Secondary | ICD-10-CM | POA: Diagnosis not present

## 2017-12-04 DIAGNOSIS — F419 Anxiety disorder, unspecified: Secondary | ICD-10-CM | POA: Insufficient documentation

## 2017-12-04 DIAGNOSIS — F329 Major depressive disorder, single episode, unspecified: Secondary | ICD-10-CM | POA: Diagnosis not present

## 2017-12-04 DIAGNOSIS — M545 Low back pain, unspecified: Secondary | ICD-10-CM

## 2017-12-04 DIAGNOSIS — Z87891 Personal history of nicotine dependence: Secondary | ICD-10-CM | POA: Diagnosis not present

## 2017-12-04 DIAGNOSIS — R1031 Right lower quadrant pain: Secondary | ICD-10-CM | POA: Diagnosis present

## 2017-12-04 LAB — CBC WITH DIFFERENTIAL/PLATELET
Basophils Absolute: 0 10*3/uL (ref 0.0–0.1)
Basophils Relative: 0 %
EOS ABS: 0.1 10*3/uL (ref 0.0–0.7)
EOS PCT: 1 %
HCT: 35 % — ABNORMAL LOW (ref 36.0–46.0)
HEMOGLOBIN: 11.2 g/dL — AB (ref 12.0–15.0)
LYMPHS ABS: 2.2 10*3/uL (ref 0.7–4.0)
LYMPHS PCT: 28 %
MCH: 21.5 pg — AB (ref 26.0–34.0)
MCHC: 32 g/dL (ref 30.0–36.0)
MCV: 67.2 fL — AB (ref 78.0–100.0)
MONOS PCT: 7 %
Monocytes Absolute: 0.5 10*3/uL (ref 0.1–1.0)
NEUTROS PCT: 64 %
Neutro Abs: 4.9 10*3/uL (ref 1.7–7.7)
Platelets: 230 10*3/uL (ref 150–400)
RBC: 5.21 MIL/uL — AB (ref 3.87–5.11)
RDW: 17 % — ABNORMAL HIGH (ref 11.5–15.5)
WBC: 7.7 10*3/uL (ref 4.0–10.5)

## 2017-12-04 LAB — URINALYSIS, ROUTINE W REFLEX MICROSCOPIC
BILIRUBIN URINE: NEGATIVE
GLUCOSE, UA: NEGATIVE mg/dL
HGB URINE DIPSTICK: NEGATIVE
Ketones, ur: NEGATIVE mg/dL
Leukocytes, UA: NEGATIVE
Nitrite: NEGATIVE
Protein, ur: NEGATIVE mg/dL
SPECIFIC GRAVITY, URINE: 1.021 (ref 1.005–1.030)
pH: 5 (ref 5.0–8.0)

## 2017-12-04 LAB — COMPREHENSIVE METABOLIC PANEL
ALBUMIN: 3.9 g/dL (ref 3.5–5.0)
ALK PHOS: 88 U/L (ref 38–126)
ALT: 14 U/L (ref 14–54)
AST: 17 U/L (ref 15–41)
Anion gap: 9 (ref 5–15)
BUN: 15 mg/dL (ref 6–20)
CALCIUM: 8.8 mg/dL — AB (ref 8.9–10.3)
CHLORIDE: 102 mmol/L (ref 101–111)
CO2: 27 mmol/L (ref 22–32)
CREATININE: 0.79 mg/dL (ref 0.44–1.00)
GFR calc Af Amer: >60 mL/min (ref >60)
GFR calc non Af Amer: >60 mL/min (ref >60)
GLUCOSE: 110 mg/dL — AB (ref 65–99)
Potassium: 3.1 mmol/L — ABNORMAL LOW (ref 3.5–5.1)
SODIUM: 138 mmol/L (ref 135–145)
Total Bilirubin: 0.4 mg/dL (ref 0.3–1.2)
Total Protein: 7.4 g/dL (ref 6.5–8.1)

## 2017-12-04 LAB — LIPASE, BLOOD: Lipase: 36 U/L (ref 11–51)

## 2017-12-04 MED ORDER — TRAMADOL HCL 50 MG PO TABS
50.0000 mg | ORAL_TABLET | Freq: Four times a day (QID) | ORAL | 0 refills | Status: DC | PRN
Start: 2017-12-04 — End: 2018-02-21

## 2017-12-04 MED ORDER — DIAZEPAM 5 MG PO TABS
5.0000 mg | ORAL_TABLET | Freq: Once | ORAL | Status: AC
  Administered 2017-12-04: 5 mg via ORAL
  Filled 2017-12-04: qty 1

## 2017-12-04 MED ORDER — TRAMADOL HCL 50 MG PO TABS
50.0000 mg | ORAL_TABLET | Freq: Once | ORAL | Status: AC
  Administered 2017-12-04: 50 mg via ORAL
  Filled 2017-12-04: qty 1

## 2017-12-04 MED ORDER — KETOROLAC TROMETHAMINE 30 MG/ML IJ SOLN
30.0000 mg | Freq: Once | INTRAMUSCULAR | Status: AC
  Administered 2017-12-04: 30 mg via INTRAVENOUS
  Filled 2017-12-04: qty 1

## 2017-12-04 MED ORDER — METHOCARBAMOL 500 MG PO TABS: 500.0000 mg | ORAL_TABLET | Freq: Three times a day (TID) | ORAL | 0 refills | Status: DC | PRN

## 2017-12-04 NOTE — Discharge Instructions (Addendum)
You have been seen in the Emergency Department (ED)  today for back pain.  Your workup and exam have not shown any acute abnormalities and you are likely suffering from muscle strain or possible problems with your discs, but there is no treatment that will fix your symptoms at this time.  Please take Motrin (ibuprofen) as needed for your pain according to the instructions written on the box.  Alternatively, for the next five days you can take 600mg  three times daily with meals (it may upset your stomach).  Your CT scan showed bad arthritis in the lower back. Discuss this findings with your Orthopedic doctor at the [facility].  Take Tramadol and Robaxin as prescribed for severe pain. Do not drink alcohol, drive or participate in any other potentially dangerous activities while taking this medication as it may make you sleepy. Do not take this medication with any other sedating medications, either prescription or over-the-counter.   This medication is an opiate (or narcotic) pain medication and can be habit forming.  Use it as little as possible to achieve adequate pain control.  Do not use or use it with extreme caution if you have a history of opiate abuse or dependence.  This medication is intended for your use only - do not give any to anyone else and keep it in a secure place where nobody else, especially children, have access to it.  It will also cause or worsen constipation, so you may want to consider taking an over-the-counter stool softener while you are taking this medication.  Please follow up with your doctor as soon as possible regarding today's ED visit and your back pain.  Return to the ED for worsening back pain, fever, weakness or numbness of either leg, or if you develop either (1) an inability to urinate or have bowel movements, or (2) loss of your ability to control your bathroom functions (if you start having "accidents"), or if you develop other new symptoms that concern you.

## 2017-12-04 NOTE — ED Triage Notes (Signed)
Pt reports back pain that is radiating up to her neck and causing numbness in fingertips that began today. States pain starts in lower back. Denies injury.

## 2017-12-04 NOTE — ED Provider Notes (Signed)
Emergency Department Provider Note   I have reviewed the triage vital signs and the nursing notes.   HISTORY  Chief Complaint Back Pain   HPI Vicki Dean is a 57 y.o. female with PMH of COPD, GERD, HTN, and chronic back pain into the emergency department for evaluation of worsening right-sided back/flank discomfort which feels different than her chronic back pain.  Patient is also describing a burning pain in her right shoulder radiating down the arm.  Symptoms began earlier today with no obvious inciting event or injury.  She denies any specific numbness or weakness.  Pain seems to be traveling from her lower back to her shoulder.  Denies chest pain or dyspnea.  In terms of the patient's flank discomfort she describes it as intermittent and severe.  No dysuria or hematuria.  No fevers or chills.  No abdominal discomfort.   The patient is currently followed at the [place]. She reports having recent MRI of the cervical spine and lumbar spine with surgery on the neck and right shoulder planned for next week.   Past Medical History:  Diagnosis Date  . Anxiety   . Arthritis    possibly her right shoulder.  . Chronic back pain   . COPD (chronic obstructive pulmonary disease) (HCC)   . Fibromyalgia   . GERD (gastroesophageal reflux disease)   . Hypertension   . Sleep apnea    could not tolerate    Patient Active Problem List   Diagnosis Date Noted  . Microcytic anemia 10/25/2017  . Constipation 10/25/2017  . Hematochezia 07/31/2017  . Dysphagia 07/31/2017  . ANEMIA-NOS 07/06/2006  . DEPRESSION 07/06/2006  . COMMON MIGRAINE 07/06/2006  . HYPERTENSION 07/06/2006  . GERD 07/06/2006  . IRRITABLE BOWEL SYNDROME 07/06/2006  . INFECTION, URINARY TRACT NOS 07/06/2006  . DYSFUNCTIONAL UTERINE BLEEDING 07/06/2006  . LOW BACK PAIN 07/06/2006    Past Surgical History:  Procedure Laterality Date  . ABDOMINAL HYSTERECTOMY    . CARPAL TUNNEL RELEASE Left   . COLONOSCOPY WITH PROPOFOL  N/A 08/23/2017   Dr. Jena Gauss, grade II hemorrhoids. next TCS in 10 years.   . ESOPHAGOGASTRODUODENOSCOPY (EGD) WITH PROPOFOL N/A 08/23/2017   Dr. Jena Gauss: mild erosive reflux esophagitis s/p dilation due to h/o dysphagia.   Marland Kitchen FOOT SURGERY Right    screws from fracture  . GANGLION CYST EXCISION Right    foot  . KNEE SURGERY Right    arthroscopy  . MALONEY DILATION N/A 08/23/2017   Procedure: Elease Hashimoto DILATION;  Surgeon: Corbin Ade, MD;  Location: [facility];  Service: Endoscopy;  Laterality: N/A;    Allergies Doxepin; Lisinopril; and Pneumococcal vaccines  Family History  Problem Relation Age of Onset  . Hypertension Mother   . Diabetes Mother   . Arthritis Mother   . Hypercholesterolemia Mother   . Emphysema Father   . Cancer Other   . Diabetes Other   . Colon cancer Neg Hx   . Gastric cancer Neg Hx   . Esophageal cancer Neg Hx     Social History Social History   Tobacco Use  . Smoking status: Former Smoker    Packs/day: 1.00    Years: 20.00    Pack years: 20.00    Types: Cigarettes    Last attempt to quit: 08/20/2013    Years since quitting: 4.2  . Smokeless tobacco: Never Used  Substance Use Topics  . Alcohol use: No    Frequency: Never    Comment: in the past  .  Drug use: No    Review of Systems  Constitutional: No fever/chills Eyes: No visual changes. ENT: No sore throat. Cardiovascular: Denies chest pain. Respiratory: Denies shortness of breath. Gastrointestinal: No abdominal pain. No nausea, no vomiting.  No diarrhea.  No constipation. Genitourinary: Negative for dysuria. Musculoskeletal: Positive for back pain and left shoulder/arm pain. Positive right flank pain.  Skin: Negative for rash. Neurological: Negative for headaches, focal weakness or numbness.  10-point ROS otherwise negative.  ____________________________________________   PHYSICAL EXAM:  VITAL SIGNS: ED Triage Vitals  Enc Vitals Group     BP 12/04/17 1640 125/78     Pulse Rate  12/04/17 1640 78     Resp 12/04/17 1640 17     Temp 12/04/17 1640 98.5 F (36.9 C)     Temp Source 12/04/17 1640 Oral     SpO2 12/04/17 1640 95 %     Weight 12/04/17 1640 229 lb (103.9 kg)     Height 12/04/17 1640 5\' 7"  (1.702 m)     Pain Score 12/04/17 1641 9   Constitutional: Alert and oriented. Well appearing and in no acute distress. Eyes: Conjunctivae are normal.  Head: Atraumatic. Nose: No congestion/rhinnorhea. Mouth/Throat: Mucous membranes are moist.  Neck: No stridor. No cervical spine tenderness to palpation. Cardiovascular: Normal rate, regular rhythm. Good peripheral circulation. Grossly normal heart sounds.   Respiratory: Normal respiratory effort.  No retractions. Lungs CTAB. Gastrointestinal: Soft and nontender. No distention. Mild right CVA tenderness to percussion.  Musculoskeletal: No lower extremity tenderness nor edema. No gross deformities of extremities. Neurologic:  Normal speech and language. No gross focal neurologic deficits are appreciated.  Skin:  Skin is warm, dry and intact. No rash noted.  ____________________________________________   LABS (all labs ordered are listed, but only abnormal results are displayed)  Labs Reviewed  COMPREHENSIVE METABOLIC PANEL - Abnormal; Notable for the following components:      Result Value   Potassium 3.1 (*)    Glucose, Bld 110 (*)    Calcium 8.8 (*)    All other components within normal limits  CBC WITH DIFFERENTIAL/PLATELET - Abnormal; Notable for the following components:   RBC 5.21 (*)    Hemoglobin 11.2 (*)    HCT 35.0 (*)    MCV 67.2 (*)    MCH 21.5 (*)    RDW 17.0 (*)    All other components within normal limits  URINE CULTURE  LIPASE, BLOOD  URINALYSIS, ROUTINE W REFLEX MICROSCOPIC   _________________________________________  RADIOLOGY  Ct Renal Stone Study  Result Date: 12/04/2017 CLINICAL DATA:  Flank pain.  Low back pain radiating to the neck. EXAM: CT ABDOMEN AND PELVIS WITHOUT CONTRAST  TECHNIQUE: Multidetector CT imaging of the abdomen and pelvis was performed following the standard protocol without IV contrast. COMPARISON:  05/22/2005 FINDINGS: Lower chest: Minimal atelectasis or scarring in the lung bases. No pleural effusion. Normal heart size. Hepatobiliary: Unchanged subcentimeter low-density lesion in hepatic segment IVa/VIII, too small to fully characterize though considered benign given stability. Unremarkable gallbladder. No biliary dilatation. Pancreas: Unremarkable. Spleen: Unremarkable. Adrenals/Urinary Tract: Unremarkable adrenal glands. No evidence of renal mass, calculi, or hydronephrosis. Unremarkable bladder. Stomach/Bowel: The stomach is within normal limits. There is no evidence of bowel obstruction or gross bowel wall thickening. The appendix is unremarkable. Vascular/Lymphatic: Normal caliber of the abdominal aorta. No enlarged lymph nodes. Reproductive: Status post hysterectomy. No adnexal masses. Other: No intraperitoneal free fluid.  No abdominal wall hernia. Musculoskeletal: Lower lumbar facet arthrosis, markedly severe on the right  at L5-S1. IMPRESSION: 1. No urolithiasis, hydronephrosis, or other acute abnormality identified in the abdomen or pelvis. 2. Severe lower lumbar facet arthrosis. Electronically Signed   By: Sebastian AcheAllen  Grady M.D.   On: 12/04/2017 19:18    ____________________________________________   PROCEDURES  Procedure(s) performed:   Procedures  None ____________________________________________   INITIAL IMPRESSION / ASSESSMENT AND PLAN / ED COURSE  Pertinent labs & imaging results that were available during my care of the patient were reviewed by me and considered in my medical decision making (see chart for details).  Patient presents to the emergency department for evaluation of worsening back and flank discomfort.  She has some associated burning discomfort in her right upper extremity.  This may be radicular but she has known chronic  spine disease and no weakness or numbness on my exam to necessitate emergent spine MRI. [facility] and has surgery scheduled for next week, according to the patient. She has no red flag symptoms to suspect cauda equina clinically.  Her flank discomfort seems different than her chronic back pain and may represent nephrolithiasis.  Plan for labs, UA, CT renal protocol.  Will treat with Toradol and Valium for possible paraspinal muscle strain vs kidney stone.   07:35 PM Labs and CT renal protocol reviewed.  No evidence of nephrolithiasis.  Labs unremarkable.  Patient pain slightly improved with Valium and Toradol.  Plan to discharge home with very short course of tramadol and Robaxin.  I encouraged her to call [facility] to discuss pain management from now until her surgery next week.   At this time, I do not feel there is any life-threatening condition present. I have reviewed and discussed all results (EKG, imaging, lab, urine as appropriate), exam findings with patient. I have reviewed nursing notes and appropriate previous records.  I feel the patient is safe to be discharged home without further emergent workup. Discussed usual and customary return precautions. Patient and family (if present) verbalize understanding and are comfortable with this plan.  Patient will follow-up with their primary care provider. If they do not have a primary care provider, information for follow-up has been provided to them. All questions have been answered.  ____________________________________________  FINAL CLINICAL IMPRESSION(S) / ED DIAGNOSES  Final diagnoses:  Acute right-sided low back pain without sciatica  Acute pain of right shoulder     MEDICATIONS GIVEN DURING THIS VISIT:  Medications  traMADol (ULTRAM) tablet 50 mg (has no administration in time range)  ketorolac (TORADOL) 30 MG/ML injection 30 mg (30 mg Intravenous Given 12/04/17 1723)    diazepam (VALIUM) tablet 5 mg (5 mg Oral Given 12/04/17 1713)     NEW OUTPATIENT MEDICATIONS STARTED DURING THIS VISIT:  New Prescriptions   METHOCARBAMOL (ROBAXIN) 500 MG TABLET    Take 1 tablet (500 mg total) by mouth every 8 (eight) hours as needed for muscle spasms.   TRAMADOL (ULTRAM) 50 MG TABLET    Take 1 tablet (50 mg total) by mouth every 6 (six) hours as needed.    Note:  This document was prepared using Dragon voice recognition software and may include unintentional dictation errors.  Alona BeneJoshua Akshara Blumenthal, MD Emergency Medicine    Titianna Loomis, Arlyss RepressJoshua G, MD 12/04/17 [phone]

## 2017-12-05 LAB — URINE CULTURE: Special Requests: NORMAL

## 2018-02-10 ENCOUNTER — Other Ambulatory Visit: Payer: Self-pay

## 2018-02-10 ENCOUNTER — Encounter (HOSPITAL_COMMUNITY): Payer: Self-pay | Admitting: Emergency Medicine

## 2018-02-10 ENCOUNTER — Emergency Department (HOSPITAL_COMMUNITY)
Admission: EM | Admit: 2018-02-10 | Discharge: 2018-02-10 | Disposition: A | Discharge status: Home / Self Care | Payer: Non-veteran care | Attending: Emergency Medicine | Admitting: Emergency Medicine

## 2018-02-10 DIAGNOSIS — J449 Chronic obstructive pulmonary disease, unspecified: Secondary | ICD-10-CM | POA: Insufficient documentation

## 2018-02-10 DIAGNOSIS — I10 Essential (primary) hypertension: Secondary | ICD-10-CM | POA: Diagnosis not present

## 2018-02-10 DIAGNOSIS — G8929 Other chronic pain: Secondary | ICD-10-CM

## 2018-02-10 DIAGNOSIS — Z87891 Personal history of nicotine dependence: Secondary | ICD-10-CM | POA: Diagnosis not present

## 2018-02-10 DIAGNOSIS — Z79899 Other long term (current) drug therapy: Secondary | ICD-10-CM | POA: Diagnosis not present

## 2018-02-10 DIAGNOSIS — M545 Low back pain: Secondary | ICD-10-CM | POA: Insufficient documentation

## 2018-02-10 DIAGNOSIS — R1084 Generalized abdominal pain: Secondary | ICD-10-CM | POA: Diagnosis not present

## 2018-02-10 MED ORDER — METAXALONE 800 MG PO TABS: 800.0000 mg | ORAL_TABLET | Freq: Three times a day (TID) | ORAL | 0 refills | Status: DC | PRN

## 2018-02-10 MED ORDER — METHYLPREDNISOLONE 4 MG PO TBPK: ORAL_TABLET | ORAL | 0 refills | Status: DC

## 2018-02-10 NOTE — ED Provider Notes (Signed)
[facility] EMERGENCY DEPARTMENT Provider Note   CSN: [id number] Arrival date & time: 02/10/18  1811     History   Chief Complaint Chief Complaint  Patient presents with  . Back Pain    HPI Vicki Dean is a 57 y.o. female.   Back Pain      Pt was seen at 1936. Per pt, c/o gradual onset and persistence of constant acute flair of her chronic low back "pain" for the past 1 week.  Denies any change in her usual chronic pain pattern.  Pain worsens with palpation of the area and body position changes. Denies incont/retention of bowel or bladder, no saddle anesthesia, no focal motor weakness, no tingling/numbness in extremities, no fevers, no injury, no abd pain.   The symptoms have been associated with no other complaints. The patient has a significant history of similar symptoms previously, recently being evaluated for this complaint and multiple prior evals for same.    Past Medical History:  Diagnosis Date  . Anxiety   . Arthritis    possibly her right shoulder.  . Chronic back pain   . COPD (chronic obstructive pulmonary disease) (HCC)   . Fibromyalgia   . GERD (gastroesophageal reflux disease)   . Hypertension   . Sleep apnea    could not tolerate    Patient Active Problem List   Diagnosis Date Noted  . Microcytic anemia 10/25/2017  . Constipation 10/25/2017  . Hematochezia 07/31/2017  . Dysphagia 07/31/2017  . ANEMIA-NOS 07/06/2006  . DEPRESSION 07/06/2006  . COMMON MIGRAINE 07/06/2006  . HYPERTENSION 07/06/2006  . GERD 07/06/2006  . IRRITABLE BOWEL SYNDROME 07/06/2006  . INFECTION, URINARY TRACT NOS 07/06/2006  . DYSFUNCTIONAL UTERINE BLEEDING 07/06/2006  . LOW BACK PAIN 07/06/2006    Past Surgical History:  Procedure Laterality Date  . ABDOMINAL HYSTERECTOMY    . CARPAL TUNNEL RELEASE Left   . COLONOSCOPY WITH PROPOFOL N/A 08/23/2017   Dr. Jena Gauss, grade II hemorrhoids. next TCS in 10 years.   . ESOPHAGOGASTRODUODENOSCOPY (EGD) WITH PROPOFOL N/A  08/23/2017   Dr. Jena Gauss: mild erosive reflux esophagitis s/p dilation due to h/o dysphagia.   Marland Kitchen FOOT SURGERY Right    screws from fracture  . GANGLION CYST EXCISION Right    foot  . KNEE SURGERY Right    arthroscopy  . MALONEY DILATION N/A 08/23/2017   Procedure: Elease Hashimoto DILATION;  Surgeon: Corbin Ade, MD;  Location[facility];  Service: Endoscopy;  Laterality: N/A;     OB History    Gravida  3   Para  2   Term  2   Preterm      AB  1   Living        SAB  1   TAB      Ectopic      Multiple      Live Births               Home Medications    Prior to Admission medications   Medication Sig Start Date End Date Taking? Authorizing Provider  albuterol (PROVENTIL HFA;VENTOLIN HFA) 108 (90 Base) MCG/ACT inhaler Inhale 1-2 puffs into the lungs every 6 (six) hours as needed for wheezing or shortness of breath.    [provider]  amLODipine (NORVASC) 10 MG tablet Take 10 mg by mouth daily.    [provider]  baclofen (LIORESAL) 10 MG tablet Take 10 mg by mouth 2 (two) times daily.    [provider]  benzonatate (TESSALON) 100 MG capsule Take 100 mg by mouth every 8 (eight) hours as needed for cough.    [provider]  budesonide-formoterol (SYMBICORT) 160-4.5 MCG/ACT inhaler Inhale 2 puffs into the lungs 2 (two) times daily.     [provider]  hydrochlorothiazide (HYDRODIURIL) 25 MG tablet Take 12.5 mg by mouth daily.     [provider]  linaclotide Karlene Einstein) 290 MCG CAPS capsule Take 1 capsule (290 mcg total) by mouth daily before breakfast. 10/30/17   Tiffany Kocher, PA-C  lurasidone (LATUDA) 20 MG TABS tablet Take 20 mg by mouth every evening.    [provider]  methocarbamol (ROBAXIN) 500 MG tablet Take 1 tablet (500 mg total) by mouth every 8 (eight) hours as needed for muscle spasms. 12/04/17   Long, Arlyss Repress, MD  omeprazole (PRILOSEC) 20 MG capsule Take 20 mg by mouth daily.    [provider]  traMADol (ULTRAM) 50 MG tablet Take 1 tablet (50 mg total) by mouth every 6 (six) hours as needed. 12/04/17   Long, Arlyss Repress, MD  traZODone (DESYREL) 100 MG tablet Take 100 mg by mouth at bedtime as needed for sleep.    [provider]  venlafaxine XR (EFFEXOR-XR) 75 MG 24 hr capsule Take 75 mg by mouth daily with breakfast.     [provider]    Family History Family History  Problem Relation Age of Onset  . Hypertension Mother   . Diabetes Mother   . Arthritis Mother   . Hypercholesterolemia Mother   . Emphysema Father   . Cancer Other   . Diabetes Other   . Colon cancer Neg Hx   . Gastric cancer Neg Hx   . Esophageal cancer Neg Hx     Social History Social History   Tobacco Use  . Smoking status: Former Smoker    Packs/day: 1.00    Years: 20.00    Pack years: 20.00    Types: Cigarettes    Last attempt to quit: 08/20/2013    Years since quitting: 4.4  . Smokeless tobacco: Never Used  Substance Use Topics  . Alcohol use: No    Frequency: Never    Comment: in the past  . Drug use: No     Allergies   Doxepin; Lisinopril; and Pneumococcal vaccines   Review of Systems Review of Systems  Musculoskeletal: Positive for back pain.   ROS: Statement: All systems negative except as marked or noted in the HPI; Constitutional: Negative for fever and chills. ; ; Eyes: Negative for eye pain, redness and discharge. ; ; ENMT: Negative for ear pain, hoarseness, nasal congestion, sinus pressure and sore throat. ; ; Cardiovascular: Negative for chest pain, palpitations, diaphoresis, dyspnea and peripheral edema. ; ; Respiratory: Negative for cough, wheezing and stridor. ; ; Gastrointestinal: Negative for nausea, vomiting, diarrhea, abdominal pain, blood in stool, hematemesis, jaundice and rectal bleeding. . ; ; Genitourinary: Negative for dysuria, flank pain and hematuria. ; ; Musculoskeletal: +chronic LBP. Negative for neck pain. Negative for swelling  and trauma.; ; Skin: Negative for pruritus, rash, abrasions, blisters, bruising and skin lesion.; ; Neuro: Negative for headache, lightheadedness and neck stiffness. Negative for weakness, altered level of consciousness, altered mental status, extremity weakness, paresthesias, involuntary movement, seizure and syncope.      Physical Exam Updated Vital Signs BP 137/74 (BP Location: Right Arm)   Pulse 85   Temp 97.9 F (36.6 C) (Oral)   Resp 18  Ht 5\' 7"  (1.702 m)   Wt 102.1 kg   SpO2 97%   BMI 35.24 kg/m   Physical Exam 1941: Physical examination:  Nursing notes reviewed; Vital signs and O2 SAT reviewed;  Constitutional: Well developed, Well nourished, Well hydrated, In no acute distress; Head:  Normocephalic, atraumatic; Eyes: EOMI, PERRL, No scleral icterus; ENMT: Mouth and pharynx normal, Mucous membranes moist; Neck: Supple, Full range of motion, No lymphadenopathy; Cardiovascular: Regular rate and rhythm, No gallop; Respiratory: Breath sounds clear & equal bilaterally, No wheezes.  Speaking full sentences with ease, Normal respiratory effort/excursion; Chest: Nontender, Movement normal; Abdomen: Soft, Nontender, Nondistended, Normal bowel sounds; Genitourinary: No CVA tenderness; Spine:  No midline CS, TS, LS tenderness. +TTP left lumbar paraspinal muscles.;;  Extremities: Peripheral pulses normal, No tenderness, No edema, No calf edema or asymmetry.; Neuro: AA&Ox3, Major CN grossly intact.  Speech clear. No gross focal motor or sensory deficits in extremities. Strength 5/5 equal bilat UE's and LE's, including great toe dorsiflexion.  DTR 2/4 equal bilat UE's and LE's.  No gross sensory deficits.  Neg straight leg raises bilat. Climbs on and off stretcher easily by herself. Gait steady..; Skin: Color normal, Warm, Dry.   ED Treatments / Results  Labs (all labs ordered are listed, but only abnormal results are displayed)   EKG None  Radiology   Procedures Procedures (including  critical care time)  Medications Ordered in ED Medications - No data to display   Initial Impression / Assessment and Plan / ED Course  I have reviewed the triage vital signs and the nursing notes.  Pertinent labs & imaging results that were available during my care of the patient were reviewed by me and considered in my medical decision making (see chart for details).  MDM Reviewed: previous chart, nursing note and vitals Reviewed previous: CT scan   CT Renal [facility] [place] (Accession [id number]) (Order [id number])  Study Result   CLINICAL DATA:  Flank pain.  Low back pain radiating to the neck.  EXAM: CT ABDOMEN AND PELVIS WITHOUT CONTRAST  TECHNIQUE: Multidetector CT imaging of the abdomen and pelvis was performed following the standard protocol without IV contrast.  COMPARISON:  05/22/2005  FINDINGS: Lower chest: Minimal atelectasis or scarring in the lung bases. No pleural effusion. Normal heart size.  Hepatobiliary: Unchanged subcentimeter low-density lesion in hepatic segment IVa/VIII, too small to fully characterize though considered benign given stability. Unremarkable gallbladder. No biliary dilatation.  Pancreas: Unremarkable.  Spleen: Unremarkable.  Adrenals/Urinary Tract: Unremarkable adrenal glands. No evidence of renal mass, calculi, or hydronephrosis. Unremarkable bladder.  Stomach/Bowel: The stomach is within normal limits. There is no evidence of bowel obstruction or gross bowel wall thickening. The appendix is unremarkable.  Vascular/Lymphatic: Normal caliber of the abdominal aorta. No enlarged lymph nodes.  Reproductive: Status post hysterectomy. No adnexal masses.  Other: No intraperitoneal free fluid.  No abdominal wall hernia.  Musculoskeletal: Lower lumbar facet arthrosis, markedly severe on the right at L5-S1.  IMPRESSION: 1. No urolithiasis, hydronephrosis, or other acute abnormality identified in the abdomen or pelvis. 2. Severe  lower lumbar facet arthrosis.   Electronically Signed   By: Sebastian Ache M.D.   On: 12/04/2017 19:18     1945:  Pt states to me she "goes to the [place]" for her back pain. EDP note from 12/04/2017: "She had recent MRIs at the [facility] by report and has surgery scheduled for next week, according to the patient." "I encouraged her to call her orthopedic physician at the [facility]  tomorrow to discuss pain management from now until her surgery next week."  Pt denies change to her PSHx today. [place] 5mg  tabs, #20, [place] [facility] [place] Pt was rx ultram on 12/04/2017 ED visit and this is not in either state narcotic database. I informed pt that I will treat her pain but not with narcotic medications. Pt was not pleased regarding this information. Dx and previous testing d/w pt.  Questions answered.  Verb understanding, agreeable to d/c home with outpt f/u.    Final Clinical Impressions(s) / ED Diagnoses   Final diagnoses:  None    ED Discharge Orders    None       Samuel JesterMcManus, Janessa Mickle, DO 02/14/18 0004

## 2018-02-10 NOTE — ED Triage Notes (Signed)
Pt c/o chronic back pain that started last week. Pain shooting down L leg. No OTC medications today

## 2018-02-10 NOTE — Discharge Instructions (Addendum)
Take the prescriptions as directed.  Apply moist heat or ice to the area(s) of discomfort, for 15 minutes at a time, several times per day for the next few days.  Do not fall asleep on a heating or ice pack.  Call your regular medical doctor on Monday to schedule a follow up appointment this week.  Return to the Emergency Department immediately if worsening. ° °

## 2018-02-14 ENCOUNTER — Telehealth: Payer: Self-pay | Admitting: Orthopaedic Surgery

## 2018-02-14 ENCOUNTER — Encounter: Payer: Self-pay | Admitting: Orthopaedic Surgery

## 2018-02-14 ENCOUNTER — Ambulatory Visit (INDEPENDENT_AMBULATORY_CARE_PROVIDER_SITE_OTHER): Payer: Non-veteran care | Admitting: Orthopaedic Surgery

## 2018-02-14 VITALS — BP 128/86 | HR 83 | Ht 67.0 in | Wt 233.0 lb

## 2018-02-14 DIAGNOSIS — G8929 Other chronic pain: Secondary | ICD-10-CM

## 2018-02-14 DIAGNOSIS — K219 Gastro-esophageal reflux disease without esophagitis: Secondary | ICD-10-CM | POA: Diagnosis not present

## 2018-02-14 DIAGNOSIS — M25561 Pain in right knee: Secondary | ICD-10-CM

## 2018-02-14 DIAGNOSIS — M25562 Pain in left knee: Secondary | ICD-10-CM | POA: Diagnosis not present

## 2018-02-14 NOTE — Addendum Note (Signed)
Addended by: Baird KayUGLAS, Labib Cwynar M on: 02/14/2018 11:02 AM   Modules accepted: Orders

## 2018-02-14 NOTE — Telephone Encounter (Signed)
[facility] a [facility] stating that this patient needs authorization for MRI.  Please call her back once this has been done at [phone]  Thanks

## 2018-02-14 NOTE — Progress Notes (Signed)
Subjective:    Patient ID: Vicki Dean, female    DOB: Apr 24, 1961, 57 y.o.   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  She has been followed at the [facility] facility.  She has been authorized to come here for treatment on her knees.  She brings in medical records which I have read. She has degenerative changes of the knees bilaterally more medially and of the patella areas.  She has no recent trauma.  She has swelling, popping and giving way of both knees, worse on the left.  She uses a cane.  She is on Gabapentin for lower back pain and a muscle relaxant.  She uses a cream for the knees but is on no NSAIDs for the knees secondary to GERD problems.  She has no distal edema, no redness.  The pain in the knees has gotten worse over the last year or so.  The giving way is a big problem.   Review of Systems  Constitutional: Positive for activity change.  Respiratory: Positive for shortness of breath. Negative for cough.   Musculoskeletal: Positive for arthralgias, back pain, gait problem and joint swelling.  Psychiatric/Behavioral: The patient is nervous/anxious.   All other systems reviewed and are negative.  For Review of Systems, all other systems reviewed and are negative.  The following is a summary of the past history medically, past history surgically, known current medicines, social history and family history.  This information is gathered electronically by the computer from prior information and documentation.  I review this each visit and have found including this information at this point in the chart is beneficial and informative.   Past Medical History:  Diagnosis Date  . Anxiety   . Arthritis    possibly her right shoulder.  . Chronic back pain   . COPD (chronic obstructive pulmonary disease) (HCC)   . Fibromyalgia   . GERD (gastroesophageal reflux disease)   . Hypertension   . Sleep apnea    could not tolerate     Past Surgical History:  Procedure Laterality Date  . ABDOMINAL HYSTERECTOMY    . CARPAL TUNNEL RELEASE Left   . COLONOSCOPY WITH PROPOFOL N/A 08/23/2017   Dr. Jena Dean, grade II hemorrhoids. next TCS in 10 years.   . ESOPHAGOGASTRODUODENOSCOPY (EGD) WITH PROPOFOL N/A 08/23/2017   Dr. Jena Dean: mild erosive reflux esophagitis s/p dilation due to h/o dysphagia.   Marland Kitchen FOOT SURGERY Right    screws from fracture  . GANGLION CYST EXCISION Right    foot  . KNEE SURGERY Right    arthroscopy  . MALONEY DILATION N/A 08/23/2017   Procedure: Vicki Dean DILATION;  Surgeon: Vicki Ade, MD;  [facility]: [facility];  Service: Endoscopy;  Laterality: N/A;    Current Outpatient Medications on File Prior to Visit  Medication Sig Dispense Refill  . albuterol (PROVENTIL HFA;VENTOLIN HFA) 108 (90 Base) MCG/ACT inhaler Inhale 1-2 puffs into the lungs every 6 (six) hours as needed for wheezing or shortness of breath.    Marland Kitchen amLODipine (NORVASC) 10 MG tablet Take 10 mg by mouth daily.    . baclofen (LIORESAL) 10 MG tablet Take 10 mg by mouth 2 (two) times daily.    Marland Kitchen gabapentin (NEURONTIN) 100 MG capsule Take 100 mg by mouth 3 (three) times daily.    Marland Kitchen linaclotide (LINZESS) 290 MCG CAPS capsule Take 1 capsule (290 mcg total) by mouth daily before breakfast. 90 capsule 3  .  omeprazole (PRILOSEC) 20 MG capsule Take 20 mg by mouth daily.    . traMADol (ULTRAM) 50 MG tablet Take 1 tablet (50 mg total) by mouth every 6 (six) hours as needed. 10 tablet 0  . benzonatate (TESSALON) 100 MG capsule Take 100 mg by mouth every 8 (eight) hours as needed for cough.    . budesonide-formoterol (SYMBICORT) 160-4.5 MCG/ACT inhaler Inhale 2 puffs into the lungs 2 (two) times daily.     . hydrochlorothiazide (HYDRODIURIL) 25 MG tablet Take 12.5 mg by mouth daily.     Marland Kitchen lurasidone (LATUDA) 20 MG TABS tablet Take 20 mg by mouth every evening.    . traZODone (DESYREL) 100 MG tablet Take 100 mg by mouth at bedtime as needed for sleep.    Marland Kitchen  venlafaxine XR (EFFEXOR-XR) 75 MG 24 hr capsule Take 75 mg by mouth daily with breakfast.      No current facility-administered medications on file prior to visit.     Social History   Socioeconomic History  . Marital status: Single    Spouse name: Not on file  . Number of children: Not on file  . Years of education: Not on file  . Highest education level: Not on file  Occupational History  . Not on file  Social Needs  . Financial resource strain: Not on file  . Food insecurity:    Worry: Not on file    Inability: Not on file  . Transportation needs:    Medical: Not on file    Non-medical: Not on file  Tobacco Use  . Smoking status: Former Smoker    Packs/day: 1.00    Years: 20.00    Pack years: 20.00    Types: Cigarettes    Last attempt to quit: 08/20/2013    Years since quitting: 4.4  . Smokeless tobacco: Never Used  Substance and Sexual Activity  . Alcohol use: No    Frequency: Never    Comment: in the past  . Drug use: No  . Sexual activity: Yes    Birth control/protection: Surgical  Lifestyle  . Physical activity:    Days per week: Not on file    Minutes per session: Not on file  . Stress: Not on file  Relationships  . Social connections:    Talks on phone: Not on file    Gets together: Not on file    Attends religious service: Not on file    Active member of club or organization: Not on file    Attends meetings of clubs or organizations: Not on file    Relationship status: Not on file  . Intimate partner violence:    Fear of current or ex partner: Not on file    Emotionally abused: Not on file    Physically abused: Not on file    Forced sexual activity: Not on file  Other Topics Concern  . Not on file  Social History Narrative  . Not on file    Family History  Problem Relation Age of Onset  . Hypertension Mother   . Diabetes Mother   . Arthritis Mother   . Hypercholesterolemia Mother   . Emphysema Father   . Cancer Other   . Diabetes Other     . Colon cancer Neg Hx   . Gastric cancer Neg Hx   . Esophageal cancer Neg Hx     BP 128/86   Pulse 83   Ht 5\' 7"  (1.702 m)   Wt 233 lb (  105.7 kg)   BMI 36.49 kg/m   Body mass index is 36.49 kg/m.     Objective:   Physical Exam  Constitutional: She is oriented to person, place, and time. She appears well-developed and well-nourished.  HENT:  Head: Normocephalic and atraumatic.  Eyes: Pupils are equal, round, and reactive to light. Conjunctivae and EOM are normal.  Neck: Normal range of motion. Neck supple.  Cardiovascular: Normal rate, regular rhythm and intact distal pulses.  Pulmonary/Chest: Effort normal.  Abdominal: Soft.  Musculoskeletal:       Right knee: She exhibits decreased range of motion, swelling and effusion. Tenderness found. Medial joint line tenderness noted.       Left knee: She exhibits decreased range of motion, swelling and effusion. Tenderness found. Medial joint line tenderness noted.       Legs: Neurological: She is alert and oriented to person, place, and time. She has normal reflexes. She displays normal reflexes. No cranial nerve deficit. She exhibits normal muscle tone. Coordination normal.  Skin: Skin is warm and dry.  Psychiatric: She has a normal mood and affect. Her behavior is normal. Judgment and thought content normal.          Assessment & Plan:   Encounter Diagnoses  Name Primary?  . Chronic pain of right knee Yes  . Chronic pain of left knee   . Gastroesophageal reflux disease without esophagitis    I would like to get MRIs of the knees, both.  If not able to do both, then the left knee first.  I will see what the MRIs show.  I will not give any NSAID at this time or injection.  Return after the MRIs.  Call if any problem.  Precautions discussed.   Electronically Signed Darreld McleanWayne Kaneesha Constantino, MD 8/29/20199:06 AM

## 2018-02-21 ENCOUNTER — Ambulatory Visit (HOSPITAL_COMMUNITY): Payer: Non-veteran care

## 2018-02-21 ENCOUNTER — Encounter: Payer: Self-pay | Admitting: Gastroenterology

## 2018-02-21 ENCOUNTER — Ambulatory Visit (INDEPENDENT_AMBULATORY_CARE_PROVIDER_SITE_OTHER): Payer: No Typology Code available for payment source | Admitting: Gastroenterology

## 2018-02-21 VITALS — BP 127/74 | HR 84 | Temp 97.3°F | Ht 67.0 in | Wt 234.8 lb

## 2018-02-21 DIAGNOSIS — K5900 Constipation, unspecified: Secondary | ICD-10-CM

## 2018-02-21 DIAGNOSIS — D509 Iron deficiency anemia, unspecified: Secondary | ICD-10-CM

## 2018-02-21 DIAGNOSIS — K2100 Gastro-esophageal reflux disease with esophagitis, without bleeding: Secondary | ICD-10-CM

## 2018-02-21 MED ORDER — PRUCALOPRIDE SUCCINATE 2 MG PO TABS: 2.0000 mg | ORAL_TABLET | Freq: Every day | ORAL | 0 refills | Status: DC

## 2018-02-21 MED ORDER — OMEPRAZOLE 20 MG PO CPDR: 20.0000 mg | DELAYED_RELEASE_CAPSULE | Freq: Two times a day (BID) | ORAL | 3 refills | Status: DC

## 2018-02-21 NOTE — Assessment & Plan Note (Signed)
Poorly controlled on omeprazole 20 mg daily.  Will increase to twice daily, 30 minutes before first and 30 minutes before her evening meal.  Reinforced antireflux measures.

## 2018-02-21 NOTE — Progress Notes (Signed)
Primary Care Physician: [facility]  Primary Gastroenterologist:  Roetta Sessions, MD   Chief Complaint  Patient presents with  . Melena    unsure if it has blood in it.   . Abdominal Pain    severe at times  . Constipation    BM's are up to 3 times per week or sometimes less  . erosive esophagitis    HPI: Vicki Dean is a 57 y.o. female here for follow-up of GERD, constipation.  She was last seen in May. Initially referred back in February from the [facility].  EGD and colonoscopy in March 2019 showed mild erosive reflux esophagitis status post dilation, grade 2 hemorrhoids, next colonoscopy planned for 10 years.  When I saw her back in May she was not sure what medication she was on.  She was complaining of constipation, rectal bleeding and some reflux.  Also she was determined to have microcytic anemia during her preop labs.  We obtained copy of her meds. She was not having good results with Linzess 72 mcg daily.  We switched her to Amitiza 24 mcg BID, but wasn't covered. She then started Linzess daily but feels like it is still not effective.   She has a bowel movement about 2-3 times per week.  Often stool is hard stool.  No blood in the stool at this time.  Stools are very dark but she thinks because of iron she is on.  Has crampy abdominal pain seems to resolve with bowel movement.  No nausea or vomiting.  Continues to have bad reflux.  Takes omeprazole once daily but has a lot of nocturnal burning and regurgitation.  She states she is on iron now because of her anemia.  Received another referral in June from the [facility] for hematochezia and erosive esophagitis.  Patient seen in the ED August 2019 for acute on chronic low back pain.  Was seen in the ED back in June for right-sided back/flank pain.  CT renal stone to call severe lower lumbar facet arthrosis but otherwise no urolithiasis, hydronephrosis or other acute abnormalities  Current  Outpatient Medications  Medication Sig Dispense Refill  . albuterol (PROVENTIL HFA;VENTOLIN HFA) 108 (90 Base) MCG/ACT inhaler Inhale 1-2 puffs into the lungs every 6 (six) hours as needed for wheezing or shortness of breath.    Marland Kitchen amLODipine (NORVASC) 10 MG tablet Take 10 mg by mouth daily.    . baclofen (LIORESAL) 10 MG tablet Take 10 mg by mouth 2 (two) times daily.    . benzonatate (TESSALON) 100 MG capsule Take 100 mg by mouth every 8 (eight) hours as needed for cough.    . budesonide-formoterol (SYMBICORT) 160-4.5 MCG/ACT inhaler Inhale 2 puffs into the lungs 2 (two) times daily.     . ferrous sulfate 325 (65 FE) MG tablet Take 325 mg by mouth daily.    Marland Kitchen gabapentin (NEURONTIN) 100 MG capsule Take 100 mg by mouth 3 (three) times daily.    . hydrochlorothiazide (HYDRODIURIL) 25 MG tablet Take 12.5 mg by mouth daily.     Marland Kitchen linaclotide (LINZESS) 290 MCG CAPS capsule Take 1 capsule (290 mcg total) by mouth daily before breakfast. 90 capsule 3  . lurasidone (LATUDA) 20 MG TABS tablet Take 20 mg by mouth every evening.    Marland Kitchen omeprazole (PRILOSEC) 20 MG capsule Take 20 mg by mouth daily.    Marland Kitchen venlafaxine XR (EFFEXOR-XR) 75 MG 24 hr capsule Take 75  mg by mouth daily with breakfast.      No current facility-administered medications for this visit.     Allergies as of 02/21/2018 - Review Complete 02/21/2018  Allergen Reaction Noted  . Doxepin  07/31/2017  . Lisinopril  06/28/2017  . Pneumococcal vaccines  07/31/2017    ROS:  General: Negative for anorexia, weight loss, fever, chills, fatigue, weakness. ENT: Negative for hoarseness, difficulty swallowing , nasal congestion. CV: Negative for chest pain, angina, palpitations, dyspnea on exertion, peripheral edema.  Respiratory: Negative for dyspnea at rest, dyspnea on exertion, cough, sputum, wheezing.  GI: See history of present illness. GU:  Negative for dysuria, hematuria, urinary incontinence, urinary frequency, nocturnal urination.    Endo: Negative for unusual weight change.    Physical Examination:   BP 127/74   Pulse 84   Temp (!) 97.3 F (36.3 C) (Oral)   Ht 5\' 7"  (1.702 m)   Wt 234 lb 12.8 oz (106.5 kg)   BMI 36.77 kg/m   General: Well-nourished, well-developed in no acute distress.  Eyes: No icterus. Mouth: Oropharyngeal mucosa moist and pink , no lesions erythema or exudate. Lungs: Clear to auscultation bilaterally.  Heart: Regular rate and rhythm, no murmurs rubs or gallops.  Abdomen: Bowel sounds are normal, nontender, nondistended, no hepatosplenomegaly or masses, no abdominal bruits or hernia , no rebound or guarding.   Extremities: No lower extremity edema. No clubbing or deformities. Neuro: Alert and oriented x 4   Skin: Warm and dry, no jaundice.   Psych: Alert and cooperative, normal mood and affect.  Labs:  Lab Results  Component Value Date   CREATININE 0.79 12/04/2017   BUN 15 12/04/2017   NA 138 12/04/2017   K 3.1 (L) 12/04/2017   CL 102 12/04/2017   CO2 27 12/04/2017   Lab Results  Component Value Date   ALT 14 12/04/2017   AST 17 12/04/2017   ALKPHOS 88 12/04/2017   BILITOT 0.4 12/04/2017   Lab Results  Component Value Date   WBC 7.7 12/04/2017   HGB 11.2 (L) 12/04/2017   HCT 35.0 (L) 12/04/2017   MCV 67.2 (L) 12/04/2017   PLT 230 12/04/2017   No results found for: IRON, TIBC, FERRITIN  Labs from November 20, 2017 from the [place] with hemoglobin 11.1, MCV 69.8, iron 74, TIBC 396 both normal.  The 24th her hemoglobin was 11.8. Imaging Studies: No results found.

## 2018-02-21 NOTE — Patient Instructions (Signed)
1. Please have labs done to follow up on your anemia. You can have them done at our local [facility] or through the [place]. If you have done at [facility], please make sure they fax copy to [phone].  2. Stop Linzess. 3. Start Motegrity 2mg  daily for constipation. RX faxed to the [place]. 4. Increase omeprazole to 20mg  before breakfast and before evening meal. RX faxed to the [place].  5. Please collect stool specimen and drop back off at our office to check for blood in your stool.

## 2018-02-21 NOTE — Assessment & Plan Note (Addendum)
Has failed Linzess 290 mcg daily.  Amitiza not covered.  Trial of Motegrity 2mg  daily.  If we can get her constipation under control I believe her abdominal pain will significantly improve.  Of note, referral was also for hematochezia.  Patient denies hematochezia to me.  She does state her stools are black but this is been since starting iron.    She has microcytic anemia, first seen earlier in the year during her preop appointment.  Has been stable.  Check I FOBT.  Interestingly her iron has been normal but ferritin not done.  We will recheck labs including CBC, iron/ferritin/TIBC.  Further recommendations to follow with regards to whether or not she will need to have a capsule endoscopy.  Will need to request approval from [place] prior to pursuing capsule endoscopy.

## 2018-02-22 NOTE — Progress Notes (Signed)
CC'D TO PCP °

## 2018-02-26 ENCOUNTER — Ambulatory Visit: Payer: Non-veteran care | Admitting: Orthopaedic Surgery

## 2018-02-26 ENCOUNTER — Telehealth: Payer: Self-pay

## 2018-02-26 NOTE — Telephone Encounter (Signed)
Received a call from Renae from the [place] pharamcy, Motegrity isn't a medication that the [place] has ordered before and isn't on the preferred [place] list. Pt has tried all strengths of Linzess and couldn't take Amitiza since it wasn't covered. Renae said if we want to try Amitiza, an RX must be sent and we can fax it to [phone]. They can try to get this medication for pt better since they have ordered this before. Office noted must be faxed with RX stating she's tried Linzess before.

## 2018-02-26 NOTE — Telephone Encounter (Signed)
I have mailed the RX for the pillow wedge to pt along with her AVS. I have faxed the prescription for the Motegrity and Omeprazole to the pharmacy at [facility] @ [phone].  I have faxed the orders for the Labs to [facility] [place] [facility] lab @ [phone].  I have also mailed the order for the iFOBT to pt.

## 2018-02-27 NOTE — Telephone Encounter (Signed)
OK to send RX for Amitiza BID with food. #90 3 refills. OK to see  02/21/18 ov note

## 2018-02-27 NOTE — Telephone Encounter (Signed)
Noted. See ov note.

## 2018-03-01 ENCOUNTER — Other Ambulatory Visit: Payer: Self-pay

## 2018-03-01 MED ORDER — LUBIPROSTONE 24 MCG PO CAPS: 24.0000 ug | ORAL_CAPSULE | Freq: Two times a day (BID) | ORAL | 3 refills | Status: DC

## 2018-03-01 NOTE — Telephone Encounter (Signed)
Noted. Ov note and rx [facility]ed to [facility].

## 2018-03-04 ENCOUNTER — Other Ambulatory Visit (HOSPITAL_COMMUNITY): Payer: Self-pay | Admitting: Family Medicine

## 2018-03-04 ENCOUNTER — Ambulatory Visit (HOSPITAL_COMMUNITY)
Admission: RE | Admit: 2018-03-04 | Discharge: 2018-03-04 | Disposition: A | Discharge status: Home / Self Care | Payer: No Typology Code available for payment source | Source: Ambulatory Visit | Attending: Family Medicine | Admitting: Family Medicine

## 2018-03-04 ENCOUNTER — Ambulatory Visit (INDEPENDENT_AMBULATORY_CARE_PROVIDER_SITE_OTHER): Payer: No Typology Code available for payment source | Admitting: Gastroenterology

## 2018-03-04 DIAGNOSIS — Z9181 History of falling: Secondary | ICD-10-CM | POA: Insufficient documentation

## 2018-03-04 DIAGNOSIS — M25562 Pain in left knee: Secondary | ICD-10-CM | POA: Diagnosis present

## 2018-03-04 DIAGNOSIS — R2681 Unsteadiness on feet: Secondary | ICD-10-CM | POA: Insufficient documentation

## 2018-03-04 DIAGNOSIS — M170 Bilateral primary osteoarthritis of knee: Secondary | ICD-10-CM | POA: Insufficient documentation

## 2018-03-04 DIAGNOSIS — M25561 Pain in right knee: Secondary | ICD-10-CM

## 2018-03-04 DIAGNOSIS — D509 Iron deficiency anemia, unspecified: Secondary | ICD-10-CM

## 2018-03-04 LAB — IFOBT (OCCULT BLOOD): IFOBT: POSITIVE

## 2018-03-05 NOTE — Progress Notes (Signed)
LM for a return call.  

## 2018-03-06 NOTE — Progress Notes (Signed)
Left message for a return call

## 2018-03-07 NOTE — Progress Notes (Signed)
Letter mailed to pt with the plan.

## 2018-04-10 ENCOUNTER — Telehealth: Payer: Self-pay | Admitting: Orthopaedic Surgery

## 2018-04-10 NOTE — Telephone Encounter (Signed)
Vicki Dean from the [place] called regarding a referral that was faxed to [place] for the back.  I myself have not seen this referral but did tell Vicki Dean that our doctors do not do much in regards to back pain other than like arthritis.  We usually refer out if a patient needs back surgery or has some kind of neurological problem.    Vicki Dean said this was perfectly understandable and would refer her somewhere else for her back.

## 2018-06-21 ENCOUNTER — Telehealth: Payer: Self-pay | Admitting: Internal Medicine

## 2018-06-21 NOTE — Telephone Encounter (Signed)
Sent reply back to [facility] (attn: Shayne AlkenJudy Zeller, RN) that patient did not complete labs or capsule endoscopy

## 2018-06-21 NOTE — Telephone Encounter (Signed)
The [facility] is requesting records on the patient's capsule endoscopy. I seen in the last OV note to request approval from [place] prior to pursuing capsule endoscopy per [facility] on 02/21/2018. Have we gotten that yet?

## 2018-06-21 NOTE — Telephone Encounter (Signed)
Patient did not have done. Patient did not have labs done either. Letter was mailed to patient

## 2018-07-03 NOTE — Telephone Encounter (Signed)
This was not routed to me but was seen when following up looking to see if patient had her labs done back in 02/2018 after heme + stool.   Patient had mentioned at time of OV about having her labs done at [facility]   Can we see if she had them done and get a copy?CBC, iron/tibc/ferritin.

## 2018-07-03 NOTE — Telephone Encounter (Signed)
I sent a request to [facility] to have them fax [place] any recent labs

## 2018-07-30 ENCOUNTER — Other Ambulatory Visit: Payer: Self-pay

## 2018-07-30 DIAGNOSIS — R195 Other fecal abnormalities: Secondary | ICD-10-CM

## 2018-07-30 NOTE — Telephone Encounter (Signed)
Noted. Spoke with pt and she will have labs done. Orders placed.

## 2018-07-30 NOTE — Telephone Encounter (Signed)
No records received. Can we get patient to have her cbc, iron/tibc/ferritin done???

## 2018-08-07 ENCOUNTER — Ambulatory Visit (INDEPENDENT_AMBULATORY_CARE_PROVIDER_SITE_OTHER): Payer: PRIVATE HEALTH INSURANCE | Admitting: Orthopaedic Surgery

## 2018-08-07 ENCOUNTER — Encounter (INDEPENDENT_AMBULATORY_CARE_PROVIDER_SITE_OTHER): Payer: Self-pay | Admitting: Orthopaedic Surgery

## 2018-08-07 VITALS — Ht 67.0 in | Wt 233.0 lb

## 2018-08-07 DIAGNOSIS — M25561 Pain in right knee: Secondary | ICD-10-CM | POA: Diagnosis not present

## 2018-08-07 DIAGNOSIS — M25562 Pain in left knee: Secondary | ICD-10-CM

## 2018-08-07 DIAGNOSIS — G8929 Other chronic pain: Secondary | ICD-10-CM | POA: Diagnosis not present

## 2018-08-07 NOTE — Progress Notes (Signed)
Office Visit Note   Patient: Vicki Dean           Date of Birth: [birth year]           MRN: [id number] Visit Date: 08/07/2018              Requested by: [facility] [address] [place], [place] [address] PCP: [facility]   Assessment & Plan: Visit Diagnoses:  1. Chronic pain of left knee   2. Chronic pain of right knee     Plan: At this point we truly need MRIs of both her knees so we can better ascertain as to the source of the pain in both knees and what disability she may have given the fact that she is already tried and failed multiple forms of conservative treatment including rest, ice, heat, anti-inflammatories, multiple injections and arthroscopic intervention as well as activity modification.  She is also used a cane to ambulate with.  With the failure of all these methods and modalities given the fact that she still has daily pain with instability of her knees MRIs are warranted of both knees to assess the cartilage and meniscus.  All question concerns were answered and addressed.  She will call for follow-up with [place] when she has the MRI done of both her knees.  Follow-Up Instructions: No follow-ups on file.   Orders:  No orders of the defined types were placed in this encounter.  No orders of the defined types were placed in this encounter.     Procedures: No procedures performed   Clinical Data: No additional findings.   Subjective: Chief Complaint  Patient presents with  . Left Knee - Pain  . Right Knee - Pain  The patient is someone I am seeing for the first time.  She is a very pleasant 58 year old female who comes in with the debilitating pain of both her knees as well as disability with both her knees.  She ambulates with a cane.  She is 233 pounds with a BMI of 36.  She is not diabetic.  She is had at least 1 arthroscopic intervention of 1 of the knees before and multiple injections which she says does not help now.  She wants to  check into disability at some point because she has a hard time getting up.  Her knees ache all the time and they feel like they are going to give out on her will support her.  She says she locks and catches with her knees. HPI  Review of Systems She currently denies any headache, chest pain, shortness of breath, fever, chills, nausea, vomiting  Objective: Vital Signs: Ht 5\' 7"  (1.702 m)   Wt 233 lb (105.7 kg)   BMI 36.49 kg/m   Physical Exam She is alert and orient x3 and in no acute distress Ortho Exam Examination of both knees show no effusion.  Both knees slightly hyperextend.  Both knees have patellofemoral rotation with medial lateral joint line tenderness and global pain.  Both knees feel ligamentously stable. Specialty Comments:  No specialty comments available.  Imaging: No results found. X-rays from September 2019 are independently reviewed of both knees and show no acute findings.  There is mild patellofemoral joint arthritic changes and mild medial joint space narrowing.  PMFS History: Patient Active Problem List   Diagnosis Date Noted  . Microcytic anemia 10/25/2017  . Constipation 10/25/2017  . Hematochezia 07/31/2017  . Dysphagia 07/31/2017  . ANEMIA-NOS 07/06/2006  .  DEPRESSION 07/06/2006  . COMMON MIGRAINE 07/06/2006  . HYPERTENSION 07/06/2006  . GERD (gastroesophageal reflux disease) 07/06/2006  . IRRITABLE BOWEL SYNDROME 07/06/2006  . INFECTION, URINARY TRACT NOS 07/06/2006  . DYSFUNCTIONAL UTERINE BLEEDING 07/06/2006  . LOW BACK PAIN 07/06/2006   Past Medical History:  Diagnosis Date  . Anxiety   . Arthritis    possibly her right shoulder.  . Chronic back pain   . COPD (chronic obstructive pulmonary disease) (HCC)   . Fibromyalgia   . GERD (gastroesophageal reflux disease)   . Hypertension   . Sleep apnea    could not tolerate    Family History  Problem Relation Age of Onset  . Hypertension Mother   . Diabetes Mother   . Arthritis Mother     . Hypercholesterolemia Mother   . Emphysema Father   . Cancer Other   . Diabetes Other   . Colon cancer Neg Hx   . Gastric cancer Neg Hx   . Esophageal cancer Neg Hx     Past Surgical History:  Procedure Laterality Date  . ABDOMINAL HYSTERECTOMY    . CARPAL TUNNEL RELEASE Left   . COLONOSCOPY WITH PROPOFOL N/A 08/23/2017   Dr. Jena Gauss, grade II hemorrhoids. next TCS in 10 years.   . ESOPHAGOGASTRODUODENOSCOPY (EGD) WITH PROPOFOL N/A 08/23/2017   Dr. Jena Gauss: mild erosive reflux esophagitis s/p dilation due to h/o dysphagia.   Marland Kitchen FOOT SURGERY Right    screws from fracture  . GANGLION CYST EXCISION Right    foot  . KNEE SURGERY Right    arthroscopy  . MALONEY DILATION N/A 08/23/2017   Procedure: Elease Hashimoto DILATION;  Surgeon: Corbin Ade, MD;  [facility];  Service: Endoscopy;  Laterality: N/A;   Social History   Occupational History  . Not on file  Tobacco Use  . Smoking status: Former Smoker    Packs/day: 1.00    Years: 20.00    Pack years: 20.00    Types: Cigarettes    Last attempt to quit: 08/20/2013    Years since quitting: 4.9  . Smokeless tobacco: Never Used  Substance and Sexual Activity  . Alcohol use: No    Frequency: Never    Comment: in the past  . Drug use: No  . Sexual activity: Yes    Birth control/protection: Surgical

## 2018-08-08 ENCOUNTER — Other Ambulatory Visit (INDEPENDENT_AMBULATORY_CARE_PROVIDER_SITE_OTHER): Payer: Self-pay

## 2018-08-08 DIAGNOSIS — M25562 Pain in left knee: Principal | ICD-10-CM

## 2018-08-08 DIAGNOSIS — G8929 Other chronic pain: Secondary | ICD-10-CM

## 2018-08-08 DIAGNOSIS — M25561 Pain in right knee: Principal | ICD-10-CM

## 2018-08-17 ENCOUNTER — Ambulatory Visit
Admission: RE | Admit: 2018-08-17 | Discharge: 2018-08-17 | Disposition: A | Discharge status: Home / Self Care | Payer: Non-veteran care | Source: Ambulatory Visit | Attending: Orthopaedic Surgery | Admitting: Orthopaedic Surgery

## 2018-08-17 DIAGNOSIS — M25562 Pain in left knee: Principal | ICD-10-CM

## 2018-08-17 DIAGNOSIS — M25561 Pain in right knee: Principal | ICD-10-CM

## 2018-08-17 DIAGNOSIS — G8929 Other chronic pain: Secondary | ICD-10-CM

## 2018-08-22 ENCOUNTER — Ambulatory Visit (INDEPENDENT_AMBULATORY_CARE_PROVIDER_SITE_OTHER): Payer: Non-veteran care | Admitting: Physician Assistant

## 2018-08-22 NOTE — Telephone Encounter (Signed)
Vicki Dean, can we try to get the labs.

## 2018-08-26 ENCOUNTER — Encounter (INDEPENDENT_AMBULATORY_CARE_PROVIDER_SITE_OTHER): Payer: Self-pay | Admitting: Physician Assistant

## 2018-08-26 ENCOUNTER — Ambulatory Visit (INDEPENDENT_AMBULATORY_CARE_PROVIDER_SITE_OTHER): Payer: PRIVATE HEALTH INSURANCE | Admitting: Physician Assistant

## 2018-08-26 DIAGNOSIS — S83272A Complex tear of lateral meniscus, current injury, left knee, initial encounter: Secondary | ICD-10-CM

## 2018-08-26 DIAGNOSIS — S83271D Complex tear of lateral meniscus, current injury, right knee, subsequent encounter: Secondary | ICD-10-CM

## 2018-08-26 NOTE — Progress Notes (Signed)
Office Visit Note   Patient: Vicki Dean           Date of Birth: 1960-08-13           MRN: [id number] Visit Date: 08/26/2018              Requested by: Center, [facility] [address] [place], [place] [address] PCP: Center, [facility]   Assessment & Plan: Visit Diagnoses:  1. Complex tear of lateral meniscus of right knee, unspecified whether old or current tear, subsequent encounter   2. Complex tear of lateral meniscus of left knee, unspecified whether old or current tear, initial encounter     Plan: Due to patient's bilateral knee giving way and significant pain along with MRI findings recommend knee arthroscopy with partial lateral meniscectomy both knees.  She would like to proceed with the left knee first.  Risks benefits discussed with patient at length.  She understands these risks prolonged pain, worsening pain, DVT/PE and wound infection.  Should follow-up with [place] 1 week postop.  Postoperative protocol discussed with patient.  Follow-Up Instructions: Return for 1 week post-op.   Orders:  No orders of the defined types were placed in this encounter.  No orders of the defined types were placed in this encounter.     Procedures: No procedures performed   Clinical Data: No additional findings.   Subjective: Chief Complaint  Patient presents with  . Follow-up    review scans    HPI Ms. Oestreicher returns today status post bilateral knee MRIs. She continues to have pain in both knees. States the left knee pain is worse with giving way of both knees.  MRI images of both knees reviewed with patient. Showing her the actual images and using a knee model for visualization.  MRI of the left knee showed a complex tear of the anterior horn of the lateral meniscus with an adjacent 7 mm para meniscal cyst.  Patella femoral compartment with partial thickness cartilage loss with some areas of focal thickness cartilage loss involving the trochlear groove.  Medial  compartment high-grade partial-thickness cartilage loss lateral compartment with partial thickness loss.  Right knee MRI showed a severe complex tear of the anterior horn of lateral meniscus.  Synovitis at the root of the anterior horn of the lateral meniscus.  Tricompartmental changes which included a high-grade partial-thickness cartilage loss trochlear groove, medial partial thickness loss involving the medial compartment and lateral compartment with small areas of high-grade partial-thickness cartilage loss.  Review of Systems See HPI  Objective: Vital Signs: There were no vitals taken for this visit.  Physical Exam Constitutional:      General: She is not in acute distress.    Appearance: She is not ill-appearing or diaphoretic.  Pulmonary:     Effort: Pulmonary effort is normal.  Neurological:     Mental Status: She is alert and oriented to person, place, and time.     Ortho Exam Tenderness along lateral and medial joint of both knees. No ligaments laxity. No abnormal warmth , erythema or effusion bilateral knees. Well healed knee arthroscopy scars left knee.  Specialty Comments:  No specialty comments available.  Imaging: No results found.   PMFS History: Patient Active Problem List   Diagnosis Date Noted  . Microcytic anemia 10/25/2017  . Constipation 10/25/2017  . Hematochezia 07/31/2017  . Dysphagia 07/31/2017  . ANEMIA-NOS 07/06/2006  . DEPRESSION 07/06/2006  . COMMON MIGRAINE 07/06/2006  . HYPERTENSION 07/06/2006  . GERD (  gastroesophageal reflux disease) 07/06/2006  . IRRITABLE BOWEL SYNDROME 07/06/2006  . INFECTION, URINARY TRACT NOS 07/06/2006  . DYSFUNCTIONAL UTERINE BLEEDING 07/06/2006  . LOW BACK PAIN 07/06/2006   Past Medical History:  Diagnosis Date  . Anxiety   . Arthritis    possibly her right shoulder.  . Chronic back pain   . COPD (chronic obstructive pulmonary disease) (HCC)   . Fibromyalgia   . GERD (gastroesophageal reflux disease)   .  Hypertension   . Sleep apnea    could not tolerate    Family History  Problem Relation Age of Onset  . Hypertension Mother   . Diabetes Mother   . Arthritis Mother   . Hypercholesterolemia Mother   . Emphysema Father   . Cancer Other   . Diabetes Other   . Colon cancer Neg Hx   . Gastric cancer Neg Hx   . Esophageal cancer Neg Hx     Past Surgical History:  Procedure Laterality Date  . ABDOMINAL HYSTERECTOMY    . CARPAL TUNNEL RELEASE Left   . COLONOSCOPY WITH PROPOFOL N/A 08/23/2017   Dr. Jena Gauss, grade II hemorrhoids. next TCS in 10 years.   . ESOPHAGOGASTRODUODENOSCOPY (EGD) WITH PROPOFOL N/A 08/23/2017   Dr. Jena Gauss: mild erosive reflux esophagitis s/p dilation due to h/o dysphagia.   Marland Kitchen FOOT SURGERY Right    screws from fracture  . GANGLION CYST EXCISION Right    foot  . KNEE SURGERY Right    arthroscopy  . MALONEY DILATION N/A 08/23/2017   Procedure: Elease Hashimoto DILATION;  Surgeon: Corbin Ade, MD;  [facility];  Service: Endoscopy;  Laterality: N/A;   Social History   Occupational History  . Not on file  Tobacco Use  . Smoking status: Former Smoker    Packs/day: 1.00    Years: 20.00    Pack years: 20.00    Types: Cigarettes    Last attempt to quit: 08/20/2013    Years since quitting: 5.0  . Smokeless tobacco: Never Used  Substance and Sexual Activity  . Alcohol use: No    Frequency: Never    Comment: in the past  . Drug use: No  . Sexual activity: Yes    Birth control/protection: Surgical

## 2018-09-03 NOTE — Telephone Encounter (Signed)
I have faxed another request to [facility] to send [place] any recent labs

## 2018-09-05 NOTE — Telephone Encounter (Signed)
Please find out if the patient actually had her labs done.   Her  work-up has been significantly delayed by her noncompliance.  She has a history of IDA, heme positive stool.  Awaiting further labs prior to potentially scheduling her for capsule endoscopy.  I am not sure that we are even still in dates coverage through the [place] for ongoing care.  Tia Alert

## 2018-09-05 NOTE — Telephone Encounter (Signed)
Spoke with pt. She said she thought about her lab work the other day. She is aware that she needs to go to [facility] and will go in the morning.

## 2018-09-19 ENCOUNTER — Ambulatory Visit (INDEPENDENT_AMBULATORY_CARE_PROVIDER_SITE_OTHER): Payer: Non-veteran care | Admitting: Physician Assistant

## 2018-12-05 ENCOUNTER — Telehealth (HOSPITAL_COMMUNITY): Payer: Self-pay

## 2018-12-05 NOTE — Telephone Encounter (Signed)
Called pt and schedule eval for 12/18/2018. [place] with [facility] [phone] will fax how many visit are approved when her fax machine starts working. It went down this morning. NF 12/05/2018

## 2018-12-18 ENCOUNTER — Ambulatory Visit (HOSPITAL_COMMUNITY): Payer: No Typology Code available for payment source | Attending: Family

## 2018-12-18 ENCOUNTER — Encounter (HOSPITAL_COMMUNITY): Payer: Self-pay

## 2018-12-18 DIAGNOSIS — G8929 Other chronic pain: Secondary | ICD-10-CM | POA: Insufficient documentation

## 2018-12-18 DIAGNOSIS — M5442 Lumbago with sciatica, left side: Secondary | ICD-10-CM | POA: Insufficient documentation

## 2018-12-18 DIAGNOSIS — R2689 Other abnormalities of gait and mobility: Secondary | ICD-10-CM | POA: Insufficient documentation

## 2018-12-18 DIAGNOSIS — M5441 Lumbago with sciatica, right side: Secondary | ICD-10-CM | POA: Insufficient documentation

## 2018-12-30 ENCOUNTER — Encounter

## 2018-12-31 ENCOUNTER — Encounter (HOSPITAL_COMMUNITY): Payer: Self-pay

## 2018-12-31 ENCOUNTER — Ambulatory Visit (HOSPITAL_COMMUNITY): Payer: No Typology Code available for payment source

## 2019-01-02 ENCOUNTER — Other Ambulatory Visit: Payer: Self-pay

## 2019-01-02 ENCOUNTER — Ambulatory Visit (HOSPITAL_COMMUNITY): Payer: No Typology Code available for payment source

## 2019-01-02 ENCOUNTER — Encounter (HOSPITAL_COMMUNITY): Payer: Self-pay

## 2019-01-02 DIAGNOSIS — M5442 Lumbago with sciatica, left side: Secondary | ICD-10-CM | POA: Diagnosis not present

## 2019-01-02 DIAGNOSIS — R2689 Other abnormalities of gait and mobility: Secondary | ICD-10-CM

## 2019-01-02 DIAGNOSIS — G8929 Other chronic pain: Secondary | ICD-10-CM | POA: Diagnosis present

## 2019-01-02 DIAGNOSIS — M5441 Lumbago with sciatica, right side: Secondary | ICD-10-CM | POA: Diagnosis present
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
## 2019-01-03 ENCOUNTER — Other Ambulatory Visit: Payer: Self-pay

## 2019-01-03 ENCOUNTER — Encounter (HOSPITAL_COMMUNITY): Payer: Self-pay | Admitting: *Deleted

## 2019-01-03 ENCOUNTER — Emergency Department (HOSPITAL_COMMUNITY): Payer: No Typology Code available for payment source

## 2019-01-03 ENCOUNTER — Emergency Department (HOSPITAL_COMMUNITY)
Admission: EM | Admit: 2019-01-03 | Discharge: 2019-01-03 | Disposition: A | Discharge status: Home / Self Care | Payer: No Typology Code available for payment source | Attending: Emergency Medicine | Admitting: Emergency Medicine

## 2019-01-03 DIAGNOSIS — J449 Chronic obstructive pulmonary disease, unspecified: Secondary | ICD-10-CM | POA: Diagnosis not present

## 2019-01-03 DIAGNOSIS — M25552 Pain in left hip: Secondary | ICD-10-CM | POA: Insufficient documentation

## 2019-01-03 DIAGNOSIS — R202 Paresthesia of skin: Secondary | ICD-10-CM | POA: Insufficient documentation

## 2019-01-03 DIAGNOSIS — M5430 Sciatica, unspecified side: Secondary | ICD-10-CM | POA: Diagnosis not present

## 2019-01-03 DIAGNOSIS — I10 Essential (primary) hypertension: Secondary | ICD-10-CM | POA: Insufficient documentation

## 2019-01-03 DIAGNOSIS — Z87891 Personal history of nicotine dependence: Secondary | ICD-10-CM | POA: Insufficient documentation

## 2019-01-03 DIAGNOSIS — M549 Dorsalgia, unspecified: Secondary | ICD-10-CM | POA: Insufficient documentation

## 2019-01-03 DIAGNOSIS — Z79899 Other long term (current) drug therapy: Secondary | ICD-10-CM | POA: Diagnosis not present

## 2019-01-03 MED ORDER — LIDOCAINE 5 % EX PTCH
1.0000 | MEDICATED_PATCH | CUTANEOUS | Status: DC
  Administered 2019-01-03: 1 via TRANSDERMAL
  Filled 2019-01-03 (×2): qty 1

## 2019-01-03 MED ORDER — OXYCODONE-ACETAMINOPHEN 5-325 MG PO TABS
1.0000 | ORAL_TABLET | Freq: Once | ORAL | Status: AC
  Administered 2019-01-03: 1 via ORAL
  Filled 2019-01-03: qty 1

## 2019-01-03 MED ORDER — LIDOCAINE 5 % EX PTCH: 1.0000 | MEDICATED_PATCH | CUTANEOUS | 0 refills | Status: DC

## 2019-01-03 MED ORDER — PREDNISONE 10 MG (21) PO TBPK: ORAL_TABLET | Freq: Every day | ORAL | 0 refills | Status: DC
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
## 2019-01-03 NOTE — ED Triage Notes (Signed)
Patient comes to the ED with left hip pain for one week getting worse.  Patient denies any injury.

## 2019-01-03 NOTE — ED Notes (Signed)
Transfer to MRI

## 2019-01-03 NOTE — Discharge Instructions (Addendum)
You were seen in the emergency department today for hip and back pain.  Your x-ray and MRI show degenerative changes as detailed below.  We suspect that you have a degree of sciatica.  We are sending you home with prednisone, a steroid, to take to help with inflammation and discomfort, Be sure to take this medication as prescribed with food daily in the morning.  It should be taken with food, as it can cause stomach upset, and more seriously, stomach bleeding. Do not take other nonsteroidal anti-inflammatory medications with this such as Advil, Motrin, Aleve, Mobic, Goodie Powder, or Motrin.    We are also sending you home with a prescription for lidoderm patches-apply 1 patch variable significant pain daily.  Be sure to remove the patch within 12 hours.  You make take Tylenol per over the counter dosing with these medications.   We have prescribed you new medication(s) today. Discuss the medications prescribed today with your pharmacist as they can have adverse effects and interactions with your other medicines including over the counter and prescribed medications. Seek medical evaluation if you start to experience new or abnormal symptoms after taking one of these medicines, seek care immediately if you start to experience difficulty breathing, feeling of your throat closing, facial swelling, or rash as these could be indications of a more serious allergic reaction  Please follow-up with primary care within 3 to 5 days for reevaluation.  Return to the ER for new or worsening symptoms including but not limited to worsening pain, inability to walk, loss of control bowel or bladder function, fever, numbness of the genital area, or any other concerns.

## 2019-01-07 ENCOUNTER — Other Ambulatory Visit: Payer: Self-pay

## 2019-01-07 ENCOUNTER — Ambulatory Visit (HOSPITAL_COMMUNITY): Payer: No Typology Code available for payment source

## 2019-01-07 ENCOUNTER — Encounter (HOSPITAL_COMMUNITY): Payer: Self-pay

## 2019-01-07 DIAGNOSIS — G8929 Other chronic pain: Secondary | ICD-10-CM

## 2019-01-07 DIAGNOSIS — M5442 Lumbago with sciatica, left side: Secondary | ICD-10-CM | POA: Diagnosis not present

## 2019-01-07 DIAGNOSIS — R2689 Other abnormalities of gait and mobility: Secondary | ICD-10-CM

## 2019-01-07 NOTE — Therapy (Signed)
[place] [facility] [address] [place], [place], [address] Phone: [phone]   Fax:  [phone]  Physical Therapy Treatment  Patient Details  Name: Vicki LodgeSharon M Dean MRN: [id number] Date of Birth: 1960/07/15 Referring Provider (PT): Lajean ManesKelly, Tierra L., FNP   Encounter Date: 01/07/2019  PT End of Session - 01/07/19 1025    Visit Number  2    Number of Visits  15    Date for PT Re-Evaluation  02/20/19    Authorization Type  [place] (15 visits approved for 120 days)    Authorization Time Period  01/02/19-02/20/19    Authorization - Visit Number  2    Authorization - Number of Visits  15    PT Start Time  1025   pt arrived late   PT Stop Time  1100    PT Time Calculation (min)  35 min    Activity Tolerance  Patient limited by pain    Behavior During Therapy  [facility] for tasks assessed/performed       Past Medical History:  Diagnosis Date  . Anxiety   . Arthritis    possibly her right shoulder.  . Chronic back pain   . COPD (chronic obstructive pulmonary disease) (HCC)   . Fibromyalgia   . GERD (gastroesophageal reflux disease)   . Hypertension   . Sleep apnea    could not tolerate    Past Surgical History:  Procedure Laterality Date  . ABDOMINAL HYSTERECTOMY    . CARPAL TUNNEL RELEASE Left   . COLONOSCOPY WITH PROPOFOL N/A 08/23/2017   Dr. Jena Gaussourk, grade II hemorrhoids. next TCS in 10 years.   . ESOPHAGOGASTRODUODENOSCOPY (EGD) WITH PROPOFOL N/A 08/23/2017   Dr. Jena Gaussourk: mild erosive reflux esophagitis s/p dilation due to h/o dysphagia.   Marland Kitchen. FOOT SURGERY Right    screws from fracture  . GANGLION CYST EXCISION Right    foot  . KNEE SURGERY Right    arthroscopy  . MALONEY DILATION N/A 08/23/2017   Procedure: Elease HashimotoMALONEY DILATION;  Surgeon: Corbin Adeourk, Robert M, MD;  [facility];  Service: Endoscopy;  Laterality: N/A;    There were no vitals filed for this visit.  Subjective Assessment - 01/07/19 1022    Subjective  Pt reports she went to the  chiropractor, [facility] and couldn't walk because the pain was so bad so she went to the hospital. Pt reports the hospital told her that her L sciatic N and arthritis were the cause of pain. Pt reports they gave her pain medication helped enough to allow her to walk.    Limitations  Sitting;Lifting;Standing;Walking;House hold activities    Currently in Pain?  Yes    Pain Score  5     Pain Location  Back    Pain Orientation  Lower    Pain Descriptors / Indicators  Aching;Nagging;Sharp    Pain Type  Acute pain    Pain Radiating Towards  radiates down BLE    Pain Onset  More than a month ago    Pain Frequency  Constant    Aggravating Factors   walking, standing, sitting prolonged periods    Pain Relieving Factors  nothing    Effect of Pain on Daily Activities  severe limitation         [facility] PT Assessment - 01/07/19 0001      Observation/Other Assessments   Focus on Therapeutic Outcomes (FOTO)   67% limitation      ROM /  Strength   AROM / PROM / Strength  AROM      AROM   AROM Assessment Site  Lumbar    Lumbar Flexion  75% limited, painful    Lumbar Extension  100% limited, painful    Lumbar - Right Side Bend  75% limited, painful    Lumbar - Left Side Bend  75% limited, painful    Lumbar - Right Rotation  75% limited, painful    Lumbar - Left Rotation  75% limited, painful      Ambulation/Gait   Ambulation/Gait  Yes    Ambulation/Gait Assistance  6: Modified independent (Device/Increase time)    Ambulation Distance (Feet)  28 Feet    Assistive device  Straight cane    Gait Pattern  Step-through pattern;Decreased stride length;Decreased hip/knee flexion - right;Decreased hip/knee flexion - left;Trendelenburg;Antalgic;Trunk flexed;Wide base of support    Ambulation Surface  Level;Indoor    Gait Comments  2MWT: attempted, but pt required seated rest break after 30 seconds and unable to complete test due to 9/10 pain complaints          [facility] Adult PT  Treatment/Exercise - 01/07/19 0001      Exercises   Exercises  Lumbar      Lumbar Exercises: Stretches   Active Hamstring Stretch  2 reps;20 seconds    Active Hamstring Stretch Limitations  seated      Lumbar Exercises: Seated   Sit to Stand  15 reps    Sit to Stand Limitations  elevated mat table, 21" from floor             PT Education - 01/07/19 1103    Education Details  Reviewed goals, FOTO outcome, initiated HEP, exercise technique    Person(s) Educated  Patient    Methods  Explanation;Demonstration;Handout    Comprehension  Verbalized understanding;Returned demonstration       PT Short Term Goals - 01/02/19 1558      PT SHORT TERM GOAL #1   Title  Patient will be independent with HEP, updated PRN, to improve mobility and functional strength.    Time  2    Period  Weeks    Status  New    Target Date  01/16/19      PT SHORT TERM GOAL #2   Title  Patient will have no increase in pain with AROM for lumbar spine to improve activity/position tolerance.    Time  3    Period  Weeks    Status  New    Target Date  01/23/19        PT Long Term Goals - 01/02/19 1606      PT LONG TERM GOAL #1   Title  Patient will be able to ambulate 10 minutes with LRAD to improve independence with mobility, community access, and ability to exercise via walking program.    Time  7    Period  Weeks    Status  New    Target Date  02/20/19      PT LONG TERM GOAL #2   Title  Patient will be able to perform deadlift with proper form and up to 10lbs to demonstrate improved tolerance to loading lumbar spine during lifting activity.    Time  7    Period  Weeks    Target Date  02/20/19      PT LONG TERM GOAL #3   Title  Patient will be able to sit for 15 minutes without increase in  pain indicating improve tolerance to activity and reduced irritability of symptoms.    Time  7    Period  Weeks    Status  New    Target Date  02/20/19            Plan - 01/07/19 1026     Clinical Impression Statement  Continued with pt's established POC. Initiated treatment with FOTO questionnaire, reviewed goals. Pt with 67% self perceived limitation per FOTO score. Completed lumbar AROM testing this date, but pt with such high pain in all directions pt limited 75-100% with increase in pain. Pt attempted 2MWT but only able to complete 28 ft in 30 sec before needing to stop for seated rest break and unable to complete test. Pt able to perform seated hamstring stretch with verbal cues to hinge at waist to avoid low back pain. Pt performed STS reps from elevated mat table (21" from floor) using BUE to assist in rising and lowering and performing with decreased speed with hip extension after knee extension. Pt with increase in pain with 2MWT to 9/10 limiting ability to perform exercises, but with reduction in pain with seated rest break. Pt reports improvement in pain after HS stretch and STS reps, but continues to be limited during session with occasional facial wincing when changing positions. Therapist educated pt on HEP this date and to increase lumbar AROM when pain is low to improve stiffness and reduce pain. Will continue to progress pt next session pending high pain and tolerance to activities.    Personal Factors and Comorbidities  Age;Comorbidity 3+;Past/Current Experience    Comorbidities  HTN, COPD, Fibromyalgia, depression    Examination-Activity Limitations  Locomotion Level;Bed Mobility;Bend;Lift;Squat;Stand;Sit;Sleep    Examination-Participation Restrictions  Community Activity;Laundry    Stability/Clinical Decision Making  Evolving/Moderate complexity    Rehab Potential  Fair    PT Frequency  2x / week    PT Duration  --   7 weeks (VA approved 15 visits)   PT Treatment/Interventions  ADLs/Self Care Home Management;Aquatic Therapy;Cryotherapy;Electrical Stimulation;Iontophoresis 4mg /ml Dexamethasone;Moist Heat;Traction;DME Instruction;Gait training;Stair training;Functional  mobility training;Therapeutic activities;Therapeutic exercise;Balance training;Neuromuscular re-education;Patient/family education;Manual techniques;Passive range of motion;Dry needling;Taping;Spinal Manipulations;Joint Manipulations    PT Next Visit Plan  Initiate core strengthening, BLE hip strengthening in supine. Beging lumbar AROM exercises/ Perform lateral lumbar mobilization for pain relief. Do assess/re-assess technique for forward flexion if this is an asterisk. Manual STM for pain relief and to reduce palpable restrictions PRN.    PT Home Exercise Plan  provide gentle ROM at start based on position tolerance/response; 7/21: seated HS stretch, STS    Consulted and Agree with Plan of Care  Patient       Patient will benefit from skilled therapeutic intervention in order to improve the following deficits and impairments:  Abnormal gait, Decreased activity tolerance, Decreased mobility, Difficulty walking, Decreased strength, Increased fascial restricitons, Increased muscle spasms, Postural dysfunction, Pain, Impaired sensation  Visit Diagnosis: 1. Chronic bilateral low back pain with bilateral sciatica   2. Other abnormalities of gait and mobility        Problem List Patient Active Problem List   Diagnosis Date Noted  . Microcytic anemia 10/25/2017  . Constipation 10/25/2017  . Hematochezia 07/31/2017  . Dysphagia 07/31/2017  . ANEMIA-NOS 07/06/2006  . DEPRESSION 07/06/2006  . COMMON MIGRAINE 07/06/2006  . HYPERTENSION 07/06/2006  . GERD (gastroesophageal reflux disease) 07/06/2006  . IRRITABLE BOWEL SYNDROME 07/06/2006  . INFECTION, URINARY TRACT NOS 07/06/2006  . DYSFUNCTIONAL UTERINE BLEEDING 07/06/2006  . LOW  BACK PAIN 07/06/2006       Domenick Bookbinderori Ellen Goris PT, DPT  [facility] [address] [place], [place], [address] Phone: [phone]   Fax:  [phone]  Name: Vicki LodgeSharon M Schow MRN: [id number] Date of Birth: Jan 26, 1961

## 2019-01-14 ENCOUNTER — Other Ambulatory Visit: Payer: Self-pay

## 2019-01-14 ENCOUNTER — Encounter (HOSPITAL_COMMUNITY): Payer: Self-pay | Admitting: Physical Therapy

## 2019-01-14 ENCOUNTER — Ambulatory Visit (HOSPITAL_COMMUNITY): Payer: No Typology Code available for payment source | Admitting: Physical Therapy

## 2019-01-14 DIAGNOSIS — R2689 Other abnormalities of gait and mobility: Secondary | ICD-10-CM

## 2019-01-14 DIAGNOSIS — M5442 Lumbago with sciatica, left side: Secondary | ICD-10-CM

## 2019-01-14 DIAGNOSIS — G8929 Other chronic pain: Secondary | ICD-10-CM

## 2019-01-14 NOTE — Therapy (Signed)
[place] [facility] [address] [place], [place], [address] Phone: [phone]   Fax:  [phone]  Physical Therapy Treatment  Patient Details  Name: Vicki Dean MRN: [id number] Date of Birth: [birth year] Referring Provider (PT): Lajean ManesKelly, Tierra L., FNP   Encounter Date: 01/14/2019  PT End of Session - 01/14/19 1040    Visit Number  3    Number of Visits  15    Date for PT Re-Evaluation  02/20/19    Authorization Type  [place] (15 visits approved for 120 days)    Authorization Time Period  01/02/19-02/20/19    Authorization - Visit Number  3    Authorization - Number of Visits  15    PT Start Time  1032    PT Stop Time  1112    PT Time Calculation (min)  40 min    Activity Tolerance  Patient limited by pain    Behavior During Therapy  [facility] for tasks assessed/performed       Past Medical History:  Diagnosis Date  . Anxiety   . Arthritis    possibly her right shoulder.  . Chronic back pain   . COPD (chronic obstructive pulmonary disease) (HCC)   . Fibromyalgia   . GERD (gastroesophageal reflux disease)   . Hypertension   . Sleep apnea    could not tolerate    Past Surgical History:  Procedure Laterality Date  . ABDOMINAL HYSTERECTOMY    . CARPAL TUNNEL RELEASE Left   . COLONOSCOPY WITH PROPOFOL N/A 08/23/2017   Dr. Jena Gaussourk, grade II hemorrhoids. next TCS in 10 years.   . ESOPHAGOGASTRODUODENOSCOPY (EGD) WITH PROPOFOL N/A 08/23/2017   Dr. Jena Gaussourk: mild erosive reflux esophagitis s/p dilation due to h/o dysphagia.   Marland Kitchen. FOOT SURGERY Right    screws from fracture  . GANGLION CYST EXCISION Right    foot  . KNEE SURGERY Right    arthroscopy  . MALONEY DILATION N/A 08/23/2017   Procedure: Elease HashimotoMALONEY DILATION;  Surgeon: Corbin Adeourk, Robert M, MD;  [facility];  Service: Endoscopy;  Laterality: N/A;    There were no vitals filed for this visit.  Subjective Assessment - 01/14/19 1036    Subjective  Patient reported 7/10 back pain today.     Limitations  Sitting;Lifting;Standing;Walking;House hold activities    Currently in Pain?  Yes    Pain Score  7     Pain Location  Back    Pain Orientation  Left;Lower    Pain Descriptors / Indicators  Aching;Nagging    Pain Onset  More than a month ago                       [facility] Treatment/Exercise - 01/14/19 0001      Lumbar Exercises: Stretches   Active Hamstring Stretch  3 reps;30 seconds    Active Hamstring Stretch Limitations  Seated      Lumbar Exercises: Seated   Sit to Stand  15 reps    Sit to Stand Limitations  elevated mat table, 21" from floor      Lumbar Exercises: Supine   Ab Set  15 reps    AB Set Limitations  5'' holds. VCs for form    Bent Knee Raise  20 reps    Bent Knee Raise Limitations  alternating sides 3'' holds    Bridge  10 reps    Bridge Limitations  Partial ROM  Lumbar Exercises: Sidelying   Clam  Right;Left;10 reps   Could not perform 15 due to pain     Manual Therapy   Manual Therapy  Soft tissue mobilization    Manual therapy comments  completed seperate from other skilled interventions    Soft tissue mobilization  Right sidelying using red weighted ball for IASTM pillow between patient's legs STM to patient's left gluteals and lower back for pain reducation and to decrease muscular restrictions             PT Education - 01/14/19 1121    Education Details  Continue HEP and explained purpose and technique of interventions.    Person(s) Educated  Patient    Methods  Explanation    Comprehension  Verbalized understanding       PT Short Term Goals - 01/02/19 1558      PT SHORT TERM GOAL #1   Title  Patient will be independent with HEP, updated PRN, to improve mobility and functional strength.    Time  2    Period  Weeks    Status  New    Target Date  01/16/19      PT SHORT TERM GOAL #2   Title  Patient will have no increase in pain with AROM for lumbar spine to improve activity/position tolerance.     Time  3    Period  Weeks    Status  New    Target Date  01/23/19        PT Long Term Goals - 01/02/19 1606      PT LONG TERM GOAL #1   Title  Patient will be able to ambulate 10 minutes with LRAD to improve independence with mobility, community access, and ability to exercise via walking program.    Time  7    Period  Weeks    Status  New    Target Date  02/20/19      PT LONG TERM GOAL #2   Title  Patient will be able to perform deadlift with proper form and up to 10lbs to demonstrate improved tolerance to loading lumbar spine during lifting activity.    Time  7    Period  Weeks    Target Date  02/20/19      PT LONG TERM GOAL #3   Title  Patient will be able to sit for 15 minutes without increase in pain indicating improve tolerance to activity and reduced irritability of symptoms.    Time  7    Period  Weeks    Status  New    Target Date  02/20/19            Plan - 01/14/19 1128    Clinical Impression Statement  Began session by performing core strengthening exercises and introducing new exercises including abdominal sets and supine marching with abdominal set. Reviewed HEP and continued sit to stands from elevated mat. Patient performed hip strengthening exercises this session including bridges which patient performed through partial range as well as sidelying clams which patient reported caused some increase in pain and which she was unable to perform 15 of, but could perform 10 within tolerance. Ended session with soft tissue mobilization using a red weighted ball with pressure level to patient's tolerance. Following soft tissue mobilization, patient reported having a decrease in her pain level, but did not quantify. Patient would benefit from continued skilled physical therapy in order to continue progressing towards functional goals.    Personal  Factors and Comorbidities  Age;Comorbidity 3+;Past/Current Experience    Comorbidities  HTN, COPD, Fibromyalgia, depression     Examination-Activity Limitations  Locomotion Level;Bed Mobility;Bend;Lift;Squat;Stand;Sit;Sleep    Examination-Participation Restrictions  Community Activity;Laundry    Stability/Clinical Decision Making  Evolving/Moderate complexity    Rehab Potential  Fair    PT Frequency  2x / week    PT Duration  --   7 weeks (VA approved 15 visits)   PT Treatment/Interventions  ADLs/Self Care Home Management;Aquatic Therapy;Cryotherapy;Electrical Stimulation;Iontophoresis 4mg /ml Dexamethasone;Moist Heat;Traction;DME Instruction;Gait training;Stair training;Functional mobility training;Therapeutic activities;Therapeutic exercise;Balance training;Neuromuscular re-education;Patient/family education;Manual techniques;Passive range of motion;Dry needling;Taping;Spinal Manipulations;Joint Manipulations    PT Next Visit Plan  Consider lumbar AROM exercises/ Perform lateral lumbar mobilization for pain relief. Do assess/re-assess technique for forward flexion if this is an asterisk. Manual STM for pain relief and to reduce palpable restrictions PRN.    PT Home Exercise Plan  provide gentle ROM at start based on position tolerance/response; 7/21: seated HS stretch, STS    Consulted and Agree with Plan of Care  Patient       Patient will benefit from skilled therapeutic intervention in order to improve the following deficits and impairments:  Abnormal gait, Decreased activity tolerance, Decreased mobility, Difficulty walking, Decreased strength, Increased fascial restricitons, Increased muscle spasms, Postural dysfunction, Pain, Impaired sensation  Visit Diagnosis: 1. Chronic bilateral low back pain with bilateral sciatica   2. Other abnormalities of gait and mobility        Problem List Patient Active Problem List   Diagnosis Date Noted  . Microcytic anemia 10/25/2017  . Constipation 10/25/2017  . Hematochezia 07/31/2017  . Dysphagia 07/31/2017  . ANEMIA-NOS 07/06/2006  . DEPRESSION 07/06/2006  .  COMMON MIGRAINE 07/06/2006  . HYPERTENSION 07/06/2006  . GERD (gastroesophageal reflux disease) 07/06/2006  . IRRITABLE BOWEL SYNDROME 07/06/2006  . INFECTION, URINARY TRACT NOS 07/06/2006  . DYSFUNCTIONAL UTERINE BLEEDING 07/06/2006  . LOW BACK PAIN 07/06/2006   Verne CarrowMacy Sylvester Salonga PT, DPT 11:29 AM, 01/14/19 [phone]  [facility] [address] [place], [place], [address] Phone: [phone]   Fax:  [phone]  Name: Vicki Dean MRN: [id number] Date of Birth: 01-19-1961

## 2019-01-16 ENCOUNTER — Other Ambulatory Visit: Payer: Self-pay

## 2019-01-16 ENCOUNTER — Encounter (HOSPITAL_COMMUNITY): Payer: Self-pay

## 2019-01-16 ENCOUNTER — Ambulatory Visit (HOSPITAL_COMMUNITY): Payer: No Typology Code available for payment source

## 2019-01-16 DIAGNOSIS — R2689 Other abnormalities of gait and mobility: Secondary | ICD-10-CM

## 2019-01-16 DIAGNOSIS — M5442 Lumbago with sciatica, left side: Secondary | ICD-10-CM | POA: Diagnosis not present

## 2019-01-16 DIAGNOSIS — G8929 Other chronic pain: Secondary | ICD-10-CM

## 2019-01-16 NOTE — Therapy (Signed)
[place], [place], [address] Phone: [phone]   Fax:  [phone]  Physical Therapy Treatment  Patient Details  Name: Vicki Dean MRN: [id number] Date of Birth: 1960/06/27 Referring Provider (PT): Jannet Mantis., FNP   Encounter Date: 01/16/2019  PT End of Session - 01/16/19 0959    Visit Number  4    Number of Visits  15    Date for PT Re-Evaluation  02/20/19    Authorization Type  [place] (15 visits approved for 120 days)    Authorization Time Period  01/02/19-02/20/19    Authorization - Visit Number  4    Authorization - Number of Visits  15    PT Start Time  1005    PT Stop Time  1050    PT Time Calculation (min)  45 min    Activity Tolerance  Patient limited by pain    Behavior During Therapy  [facility] for tasks assessed/performed       Past Medical History:  Diagnosis Date  . Anxiety   . Arthritis    possibly her right shoulder.  . Chronic back pain   . COPD (chronic obstructive pulmonary disease) ([place])   . Fibromyalgia   . GERD (gastroesophageal reflux disease)   . Hypertension   . Sleep apnea    could not tolerate    Past Surgical History:  Procedure Laterality Date  . ABDOMINAL HYSTERECTOMY    . CARPAL TUNNEL RELEASE Left   . COLONOSCOPY WITH PROPOFOL N/A 08/23/2017   Dr. Gala Romney, grade II hemorrhoids. next TCS in 10 years.   . ESOPHAGOGASTRODUODENOSCOPY (EGD) WITH PROPOFOL N/A 08/23/2017   Dr. Gala Romney: mild erosive reflux esophagitis s/p dilation due to h/o dysphagia.   Marland Kitchen FOOT SURGERY Right    screws from fracture  . GANGLION CYST EXCISION Right    foot  . KNEE SURGERY Right    arthroscopy  . MALONEY DILATION N/A 08/23/2017   Procedure: Venia Minks DILATION;  Surgeon: Daneil Dolin, MD;  [facility];  Service: Endoscopy;  Laterality: N/A;    There were no vitals filed for this visit.  Subjective Assessment - 01/16/19 0958    Subjective  Pt reports 6/10 LBP today. Pt reports no  issues after last therapy session. Pt reports continued pain in bil knees with mobility at home and steroid injection at [facility] pushed back from July 2020 to October 2020.    Limitations  Sitting;Lifting;Standing;Walking;House hold activities    Currently in Pain?  Yes    Pain Score  5     Pain Location  Back    Pain Orientation  Lower    Pain Descriptors / Indicators  Aching;Dull    Pain Type  Chronic pain    Pain Radiating Towards  radiates down BLE to knees    Pain Onset  More than a month ago    Pain Frequency  Constant    Aggravating Factors   walking, standing, sitting prolonged periods    Pain Relieving Factors  nothing    Effect of Pain on Daily Activities  severe limitation          [facility] Adult PT Treatment/Exercise - 01/16/19 0001      Lumbar Exercises: Stretches   Single Knee to Chest Stretch  3 reps;10 seconds    Single Knee to Chest Stretch Limitations  BLE    Lower Trunk Rotation Limitations  12 reps, 3 sec hold    Figure  4 Stretch  20 seconds    Figure 4 Stretch Limitations  BLE, supine    Other Lumbar Stretch Exercise  Supine sciatic nerve glides, x10 reps BLE      Lumbar Exercises: Supine   Ab Set  15 reps    AB Set Limitations  5" hold, contract with exhale VC    Bent Knee Raise  20 reps    Bent Knee Raise Limitations  alternating sides 3'' holds    Bridge  10 reps    Bridge Limitations  full ROM; glute set before rising to reduce LBP      Lumbar Exercises: Sidelying   Clam  Right;Left;10 reps      Manual Therapy   Manual Therapy  Soft tissue mobilization    Manual therapy comments  completed seperate from other skilled interventions    Soft tissue mobilization  Supine using red ball for instrument assisted STM to pt's bil gluteals and lumbar paraspinals to decreased pain and muscular restrictions             PT Education - 01/16/19 0959    Education Details  Updated HEP, exercise technique, making f/u appointment with PCP due to continued high pain     Person(s) Educated  Patient    Methods  Explanation;Demonstration;Handout    Comprehension  Verbalized understanding;Returned demonstration       PT Short Term Goals - 01/16/19 1001      PT SHORT TERM GOAL #1   Title  Patient will be independent with HEP, updated PRN, to improve mobility and functional strength.    Time  2    Period  Weeks    Status  On-going    Target Date  01/16/19      PT SHORT TERM GOAL #2   Title  Patient will have no increase in pain with AROM for lumbar spine to improve activity/position tolerance.    Time  3    Period  Weeks    Status  On-going    Target Date  01/23/19        PT Long Term Goals - 01/16/19 1001      PT LONG TERM GOAL #1   Title  Patient will be able to ambulate 10 minutes with LRAD to improve independence with mobility, community access, and ability to exercise via walking program.    Time  7    Period  Weeks    Status  On-going      PT LONG TERM GOAL #2   Title  Patient will be able to perform deadlift with proper form and up to 10lbs to demonstrate improved tolerance to loading lumbar spine during lifting activity.    Time  7    Period  Weeks    Status  On-going      PT LONG TERM GOAL #3   Title  Patient will be able to sit for 15 minutes without increase in pain indicating improve tolerance to activity and reduced irritability of symptoms.    Time  7    Period  Weeks    Status  On-going            Plan - 01/16/19 1000    Clinical Impression Statement  Initiated treatment with stretching throughout BLE and low back to reduce pain. Continued with core contractions to improve isometric hold strength and BLE moving to engage stable core with moving extremities. Pt tolerated bridges and clams this date, reporting muscle fatigue with 10 reps. Ended  with instrument assisted STM to bil glutes and lumbar paraspinals to reduce pain and muscular restrictions. Pt less painful this date and able to tolerate more exercises and  stretching without increase in pain. Pt reports improvement in pain at EOS to 4/10. Updated pt's HEP this date and educcated to perform daily to maintain mobility and within pain-free range. Discussed making f/u appointment with PCP due to continued high pain at home with functional mobility; pt with lower subjective pain complaints at therapy today.Continue to progress as able.    Personal Factors and Comorbidities  Age;Comorbidity 3+;Past/Current Experience    Comorbidities  HTN, COPD, Fibromyalgia, depression    Examination-Activity Limitations  Locomotion Level;Bed Mobility;Bend;Lift;Squat;Stand;Sit;Sleep    Examination-Participation Restrictions  Community Activity;Laundry    Stability/Clinical Decision Making  Evolving/Moderate complexity    Rehab Potential  Fair    PT Frequency  2x / week    PT Duration  --   7 weeks (VA approved 15 visits)   PT Treatment/Interventions  ADLs/Self Care Home Management;Aquatic Therapy;Cryotherapy;Electrical Stimulation;Iontophoresis 4mg /ml Dexamethasone;Moist Heat;Traction;DME Instruction;Gait training;Stair training;Functional mobility training;Therapeutic activities;Therapeutic exercise;Balance training;Neuromuscular re-education;Patient/family education;Manual techniques;Passive range of motion;Dry needling;Taping;Spinal Manipulations;Joint Manipulations    PT Next Visit Plan  Strengthening for core, bil hips and postural muscles. Stretching for pain relief and to improve any muscle restrictions. Consider lumbar AROM exercises/ Perform lateral lumbar mobilization for pain relief. Manual STM for pain relief and to reduce palpable restrictions PRN.    PT Home Exercise Plan  provide gentle ROM at start based on position tolerance/response; 7/21: seated HS stretch, STS; 7/30: SKTC, LTR, sciantic nerve glides, sidelying clams, bridges    Consulted and Agree with Plan of Care  Patient       Patient will benefit from skilled therapeutic intervention in order to  improve the following deficits and impairments:  Abnormal gait, Decreased activity tolerance, Decreased mobility, Difficulty walking, Decreased strength, Increased fascial restricitons, Increased muscle spasms, Postural dysfunction, Pain, Impaired sensation  Visit Diagnosis: 1. Chronic bilateral low back pain with bilateral sciatica   2. Other abnormalities of gait and mobility        Problem List Patient Active Problem List   Diagnosis Date Noted  . Microcytic anemia 10/25/2017  . Constipation 10/25/2017  . Hematochezia 07/31/2017  . Dysphagia 07/31/2017  . ANEMIA-NOS 07/06/2006  . DEPRESSION 07/06/2006  . COMMON MIGRAINE 07/06/2006  . HYPERTENSION 07/06/2006  . GERD (gastroesophageal reflux disease) 07/06/2006  . IRRITABLE BOWEL SYNDROME 07/06/2006  . INFECTION, URINARY TRACT NOS 07/06/2006  . DYSFUNCTIONAL UTERINE BLEEDING 07/06/2006  . LOW BACK PAIN 07/06/2006      Domenick Bookbinderori Kahmya Pinkham PT, DPT 01/16/19, 11:02 AM [phone]  [facility] [address] [place], [place], [address] Phone: [phone]   Fax:  [phone]  Name: Vicki Dean MRN: [id number] Date of Birth: Dec 12, 1960

## 2019-01-21 ENCOUNTER — Telehealth (HOSPITAL_COMMUNITY): Payer: Self-pay

## 2019-01-21 ENCOUNTER — Ambulatory Visit (HOSPITAL_COMMUNITY): Payer: No Typology Code available for payment source | Admitting: Physical Therapy

## 2019-01-21 NOTE — Telephone Encounter (Signed)
01/21/19  ptatient called at 113pm to say that she had to go pick up her daughter something happened with her car

## 2019-01-23 ENCOUNTER — Ambulatory Visit (HOSPITAL_COMMUNITY): Payer: No Typology Code available for payment source | Attending: Family

## 2019-01-23 ENCOUNTER — Encounter (HOSPITAL_COMMUNITY): Payer: Self-pay

## 2019-01-23 ENCOUNTER — Other Ambulatory Visit: Payer: Self-pay

## 2019-01-23 DIAGNOSIS — R2689 Other abnormalities of gait and mobility: Secondary | ICD-10-CM | POA: Diagnosis present

## 2019-01-23 DIAGNOSIS — M5442 Lumbago with sciatica, left side: Secondary | ICD-10-CM | POA: Diagnosis present

## 2019-01-23 DIAGNOSIS — M5441 Lumbago with sciatica, right side: Secondary | ICD-10-CM | POA: Insufficient documentation

## 2019-01-23 DIAGNOSIS — G8929 Other chronic pain: Secondary | ICD-10-CM | POA: Diagnosis present

## 2019-01-23 NOTE — Therapy (Signed)
[place], [place], [address] Phone: [phone]   Fax:  [phone]  Physical Therapy Treatment  Patient Details  Name: JOSLYNN JAMROZ MRN: [id number] Date of Birth: Aug 02, 1960 Referring Provider (PT): Jannet Mantis., FNP   Encounter Date: 01/23/2019  PT End of Session - 01/23/19 1329    Visit Number  5    Number of Visits  15    Date for PT Re-Evaluation  02/20/19    Authorization Type  [place] (15 visits approved for 120 days)    Authorization Time Period  01/02/19-02/20/19    Authorization - Visit Number  5    Authorization - Number of Visits  15    PT Start Time  2426    PT Stop Time  1401    PT Time Calculation (min)  40 min    Activity Tolerance  Patient tolerated treatment well;No increased pain   Pain reduced from 5/10 to 3/10 at EOS   Behavior During Therapy  [facility] for tasks assessed/performed       Past Medical History:  Diagnosis Date  . Anxiety   . Arthritis    possibly her right shoulder.  . Chronic back pain   . COPD (chronic obstructive pulmonary disease) ([place])   . Fibromyalgia   . GERD (gastroesophageal reflux disease)   . Hypertension   . Sleep apnea    could not tolerate    Past Surgical History:  Procedure Laterality Date  . ABDOMINAL HYSTERECTOMY    . CARPAL TUNNEL RELEASE Left   . COLONOSCOPY WITH PROPOFOL N/A 08/23/2017   Dr. Gala Romney, grade II hemorrhoids. next TCS in 10 years.   . ESOPHAGOGASTRODUODENOSCOPY (EGD) WITH PROPOFOL N/A 08/23/2017   Dr. Gala Romney: mild erosive reflux esophagitis s/p dilation due to h/o dysphagia.   Marland Kitchen FOOT SURGERY Right    screws from fracture  . GANGLION CYST EXCISION Right    foot  . KNEE SURGERY Right    arthroscopy  . MALONEY DILATION N/A 08/23/2017   Procedure: Venia Minks DILATION;  Surgeon: Daneil Dolin, MD;  [facility];  Service: Endoscopy;  Laterality: N/A;    There were no vitals filed for this visit.  Subjective Assessment - 01/23/19  1321    Subjective  Pt stated she is doing good today.  Reports the exercises at home are helping, pain scale currently at 4.5/10.    Currently in Pain?  Yes    Pain Score  5     Pain Location  Back    Pain Orientation  Lower    Pain Descriptors / Indicators  Aching;Dull    Pain Type  Chronic pain    Pain Onset  More than a month ago    Pain Frequency  Constant    Aggravating Factors   walking, standing, sitting, prolonged periods    Pain Relieving Factors  nothing    Effect of Pain on Daily Activities  severe limitation    Multiple Pain Sites  Yes                       [facility] Adult PT Treatment/Exercise - 01/23/19 0001      Exercises   Exercises  Lumbar      Lumbar Exercises: Stretches   Active Hamstring Stretch  3 reps;30 seconds    Active Hamstring Stretch Limitations  supine hands behind knee    Single Knee to Chest Stretch  3 reps;10 seconds  Single Knee to Chest Stretch Limitations  BLE    Lower Trunk Rotation  10 seconds   10   Lower Trunk Rotation Limitations  10 reps; 10" holds    Figure 4 Stretch  20 seconds    Figure 4 Stretch Limitations  BLE, supine      Lumbar Exercises: Standing   Other Standing Lumbar Exercises  3D hip excursion (STS instead of squat)      Lumbar Exercises: Seated   Sit to Stand  10 reps    Sit to Stand Limitations  normal chair height      Lumbar Exercises: Supine   Ab Set  15 reps    AB Set Limitations  5" hold, contract with exhale VC    Bent Knee Raise  20 reps    Bent Knee Raise Limitations  alternating sides 3'' holds    Bridge  10 reps    Bridge Limitations  full ROM; glute set before rising to reduce LBP      Lumbar Exercises: Sidelying   Clam  Right;Left;10 reps      Manual Therapy   Manual Therapy  Soft tissue mobilization    Manual therapy comments  completed seperate from other skilled interventions    Soft tissue mobilization  Prone STM lumbar paraspinals and Bil gluteal mm             PT  Education - 01/23/19 1338    Education Details  Educated on breathing paired wiht exercises to reduce holding breath    Person(s) Educated  Patient    Methods  Explanation;Demonstration       PT Short Term Goals - 01/16/19 1001      PT SHORT TERM GOAL #1   Title  Patient will be independent with HEP, updated PRN, to improve mobility and functional strength.    Time  2    Period  Weeks    Status  On-going    Target Date  01/16/19      PT SHORT TERM GOAL #2   Title  Patient will have no increase in pain with AROM for lumbar spine to improve activity/position tolerance.    Time  3    Period  Weeks    Status  On-going    Target Date  01/23/19        PT Long Term Goals - 01/16/19 1001      PT LONG TERM GOAL #1   Title  Patient will be able to ambulate 10 minutes with LRAD to improve independence with mobility, community access, and ability to exercise via walking program.    Time  7    Period  Weeks    Status  On-going      PT LONG TERM GOAL #2   Title  Patient will be able to perform deadlift with proper form and up to 10lbs to demonstrate improved tolerance to loading lumbar spine during lifting activity.    Time  7    Period  Weeks    Status  On-going      PT LONG TERM GOAL #3   Title  Patient will be able to sit for 15 minutes without increase in pain indicating improve tolerance to activity and reduced irritability of symptoms.    Time  7    Period  Weeks    Status  On-going            Plan - 01/23/19 1342    Clinical Impression Statement  Added  3D hip excursion for lumbar/hip mobility.  Continues with core contraction to improve isometic strengthening and BLE move to engage core stability with movements.  Pt educated on appropraite breathing pattern paired with external movement to reduce holding breath during exercise.  EOS with manual soft tissue mobilization to address restrictions in lumbar paraspinals and Bil gluteal mm; able to resolve Rt piriformis spasm  and reduce tension overall.  EOS pt reoprts pain reduced to 3/10.    Personal Factors and Comorbidities  Age;Comorbidity 3+;Past/Current Experience    Comorbidities  HTN, COPD, Fibromyalgia, depression    Examination-Activity Limitations  Locomotion Level;Bed Mobility;Bend;Lift;Squat;Stand;Sit;Sleep    Examination-Participation Restrictions  Community Activity;Laundry    Stability/Clinical Decision Making  Evolving/Moderate complexity    Clinical Decision Making  Moderate    Rehab Potential  Fair    PT Frequency  2x / week    PT Treatment/Interventions  ADLs/Self Care Home Management;Aquatic Therapy;Cryotherapy;Electrical Stimulation;Iontophoresis 4mg /ml Dexamethasone;Moist Heat;Traction;DME Instruction;Gait training;Stair training;Functional mobility training;Therapeutic activities;Therapeutic exercise;Balance training;Neuromuscular re-education;Patient/family education;Manual techniques;Passive range of motion;Dry needling;Taping;Spinal Manipulations;Joint Manipulations    PT Next Visit Plan  Strengthening for core, bil hips and postural muscles. Stretching for pain relief and to improve any muscle restrictions. Consider lumbar AROM exercises/ Perform lateral lumbar mobilization for pain relief. Manual STM for pain relief and to reduce palpable restrictions PRN.    PT Home Exercise Plan  provide gentle ROM at start based on position tolerance/response; 7/21: seated HS stretch, STS; 7/30: SKTC, LTR, sciantic nerve glides, sidelying clams, bridges       Patient will benefit from skilled therapeutic intervention in order to improve the following deficits and impairments:  Abnormal gait, Decreased activity tolerance, Decreased mobility, Difficulty walking, Decreased strength, Increased fascial restricitons, Increased muscle spasms, Postural dysfunction, Pain, Impaired sensation  Visit Diagnosis: 1. Other abnormalities of gait and mobility   2. Chronic bilateral low back pain with bilateral sciatica         Problem List Patient Active Problem List   Diagnosis Date Noted  . Microcytic anemia 10/25/2017  . Constipation 10/25/2017  . Hematochezia 07/31/2017  . Dysphagia 07/31/2017  . ANEMIA-NOS 07/06/2006  . DEPRESSION 07/06/2006  . COMMON MIGRAINE 07/06/2006  . HYPERTENSION 07/06/2006  . GERD (gastroesophageal reflux disease) 07/06/2006  . IRRITABLE BOWEL SYNDROME 07/06/2006  . INFECTION, URINARY TRACT NOS 07/06/2006  . DYSFUNCTIONAL UTERINE BLEEDING 07/06/2006  . LOW BACK PAIN 07/06/2006   Becky Saxasey Cockerham, LPTA; CBIS [phone]  Juel BurrowCockerham, Casey Jo 01/23/2019, 2:14 PM  [place] [facility] [address] , [place], [address] Phone: [phone]   Fax:  [phone]  Name: Dartha LodgeSharon M Nelson MRN: [id number] Date of Birth: 03/15/1961

## 2019-01-28 ENCOUNTER — Ambulatory Visit (HOSPITAL_COMMUNITY): Payer: No Typology Code available for payment source

## 2019-01-28 ENCOUNTER — Telehealth (HOSPITAL_COMMUNITY): Payer: Self-pay

## 2019-01-28 NOTE — Telephone Encounter (Signed)
01/28/19  pt said she had to take her mom in for a procedure today and would be here at her next appt.

## 2019-01-31 ENCOUNTER — Other Ambulatory Visit: Payer: Self-pay

## 2019-01-31 ENCOUNTER — Encounter (HOSPITAL_COMMUNITY): Payer: Self-pay | Admitting: Physical Therapy

## 2019-01-31 ENCOUNTER — Telehealth (HOSPITAL_COMMUNITY): Payer: Self-pay | Admitting: Physical Therapy

## 2019-01-31 ENCOUNTER — Ambulatory Visit (HOSPITAL_COMMUNITY): Payer: No Typology Code available for payment source | Admitting: Physical Therapy

## 2019-01-31 DIAGNOSIS — M5442 Lumbago with sciatica, left side: Secondary | ICD-10-CM

## 2019-01-31 DIAGNOSIS — G8929 Other chronic pain: Secondary | ICD-10-CM

## 2019-01-31 DIAGNOSIS — R2689 Other abnormalities of gait and mobility: Secondary | ICD-10-CM | POA: Diagnosis not present

## 2019-01-31 NOTE — Telephone Encounter (Signed)
No-show for 1:15pm appointment; called and spoke to patient, who apologizes and states that she thought her appointment was later in the day. Able to change her appointment time to 3:45pm today with another therapist.   Deniece Ree PT, DPT, CBIS  Supplemental Physical Therapist [facility]    Pager [phone] Acute Rehab Office [phone]

## 2019-01-31 NOTE — Therapy (Signed)
[place], [place], [address] Phone: [phone]   Fax:  [phone]  Physical Therapy Treatment  Patient Details  Name: Vicki Dean MRN: [id number] Date of Birth: 1960-12-06 Referring Provider (PT): Jannet Mantis., FNP   Encounter Date: 01/31/2019  PT End of Session - 01/31/19 1617    Visit Number  6    Number of Visits  15    Date for PT Re-Evaluation  02/20/19    Authorization Type  [place] (15 visits approved for 120 days)    Authorization Time Period  01/02/19-02/20/19    Authorization - Visit Number  6    Authorization - Number of Visits  15    PT Start Time  1550    PT Stop Time  1630    PT Time Calculation (min)  40 min    Activity Tolerance  Patient tolerated treatment well;No increased pain   Pain reduced from 5/10 to 3/10 at EOS   Behavior During Therapy  [facility] for tasks assessed/performed       Past Medical History:  Diagnosis Date  . Anxiety   . Arthritis    possibly her right shoulder.  . Chronic back pain   . COPD (chronic obstructive pulmonary disease) ([place])   . Fibromyalgia   . GERD (gastroesophageal reflux disease)   . Hypertension   . Sleep apnea    could not tolerate    Past Surgical History:  Procedure Laterality Date  . ABDOMINAL HYSTERECTOMY    . CARPAL TUNNEL RELEASE Left   . COLONOSCOPY WITH PROPOFOL N/A 08/23/2017   Dr. Gala Romney, grade II hemorrhoids. next TCS in 10 years.   . ESOPHAGOGASTRODUODENOSCOPY (EGD) WITH PROPOFOL N/A 08/23/2017   Dr. Gala Romney: mild erosive reflux esophagitis s/p dilation due to h/o dysphagia.   Marland Kitchen FOOT SURGERY Right    screws from fracture  . GANGLION CYST EXCISION Right    foot  . KNEE SURGERY Right    arthroscopy  . MALONEY DILATION N/A 08/23/2017   Procedure: Venia Minks DILATION;  Surgeon: Daneil Dolin, MD;  [facility];  Service: Endoscopy;  Laterality: N/A;    There were no vitals filed for this visit.  Subjective Assessment - 01/31/19  1555    Subjective  Pt states that she over did things yesterday( she moved into her own place). and she is having increased pain today    Limitations  Sitting;Lifting;Standing;Walking;House hold activities    Currently in Pain?  Yes    Pain Score  7     Pain Location  Back    Pain Orientation  Lower    Pain Descriptors / Indicators  Aching    Pain Type  Chronic pain    Pain Radiating Towards  both calves    Pain Onset  More than a month ago    Pain Frequency  Intermittent    Aggravating Factors   activity    Pain Relieving Factors  not sure                    MHP to back with supine exercises.   [place] Adult PT Treatment/Exercise - 01/31/19 0001      Exercises   Exercises  Lumbar      Lumbar Exercises: Stretches   Active Hamstring Stretch  3 reps;30 seconds    Active Hamstring Stretch Limitations  supine hands behind knee    Single Knee to Chest Stretch  3 reps;20 seconds  Single Knee to Chest Stretch Limitations  BLE    Lower Trunk Rotation  10 seconds   10   Lower Trunk Rotation Limitations  10 reps; 10" holds    Figure 4 Stretch  --    Figure 4 Stretch Limitations  --      Lumbar Exercises: Standing   Heel Raises  10 reps    Functional Squats  5 reps    Other Standing Lumbar Exercises  3D hip excursion (STS instead of squat)      Lumbar Exercises: Seated   Sit to Stand  --   attempted but unable today due to increased pain    Sit to Stand Limitations  normal chair height      Lumbar Exercises: Supine   Ab Set  15 reps    AB Set Limitations  5" hold, contract with exhale VC    Bent Knee Raise  20 reps    Bent Knee Raise Limitations  alternating sides 3'' holds    Bridge  10 reps    Bridge Limitations  --    Other Supine Lumbar Exercises  isometric hip adduction x 10      Lumbar Exercises: Sidelying   Clam  Right;Left;10 reps      Manual Therapy   Manual Therapy  -to lumbar paraspinal and gluteal area    Manual therapy comments  efflurage,  pettrisage    Soft tissue mobilization  to decrease pain               PT Short Term Goals - 01/16/19 1001      PT SHORT TERM GOAL #1   Title  Patient will be independent with HEP, updated PRN, to improve mobility and functional strength.    Time  2    Period  Weeks    Status  On-going    Target Date  01/16/19      PT SHORT TERM GOAL #2   Title  Patient will have no increase in pain with AROM for lumbar spine to improve activity/position tolerance.    Time  3    Period  Weeks    Status  On-going    Target Date  01/23/19        PT Long Term Goals - 01/16/19 1001      PT LONG TERM GOAL #1   Title  Patient will be able to ambulate 10 minutes with LRAD to improve independence with mobility, community access, and ability to exercise via walking program.    Time  7    Period  Weeks    Status  On-going      PT LONG TERM GOAL #2   Title  Patient will be able to perform deadlift with proper form and up to 10lbs to demonstrate improved tolerance to loading lumbar spine during lifting activity.    Time  7    Period  Weeks    Status  On-going      PT LONG TERM GOAL #3   Title  Patient will be able to sit for 15 minutes without increase in pain indicating improve tolerance to activity and reduced irritability of symptoms.    Time  7    Period  Weeks    Status  On-going            Plan - 01/31/19 1631    Clinical Impression Statement  Pt unable to complete bridges due to increased pain.  Added standing heelraises and squats.  Pain  level decreased to a 5 after treatment. Minimal tightness palpated with manual therefore limited time spent on this activity.    Personal Factors and Comorbidities  Age;Comorbidity 3+;Past/Current Experience    Comorbidities  HTN, COPD, Fibromyalgia, depression    Examination-Activity Limitations  Locomotion Level;Bed Mobility;Bend;Lift;Squat;Stand;Sit;Sleep    Examination-Participation Restrictions  Community Activity;Laundry     Stability/Clinical Decision Making  Evolving/Moderate complexity    Rehab Potential  Fair    PT Frequency  2x / week    PT Treatment/Interventions  ADLs/Self Care Home Management;Aquatic Therapy;Cryotherapy;Electrical Stimulation;Iontophoresis 4mg /ml Dexamethasone;Moist Heat;Traction;DME Instruction;Gait training;Stair training;Functional mobility training;Therapeutic activities;Therapeutic exercise;Balance training;Neuromuscular re-education;Patient/family education;Manual techniques;Passive range of motion;Dry needling;Taping;Spinal Manipulations;Joint Manipulations    PT Next Visit Plan  Strengthening for core, bil hips and postural muscles. Stretching for pain relief and to improve any muscle restrictions. Consider lumbar AROM exercises/ Perform lateral lumbar mobilization for pain relief. Manual STM for pain relief and to reduce palpable restrictions PRN.    PT Home Exercise Plan  provide gentle ROM at start based on position tolerance/response; 7/21: seated HS stretch, STS; 7/30: SKTC, LTR, sciantic nerve glides, sidelying clams, bridges       Patient will benefit from skilled therapeutic intervention in order to improve the following deficits and impairments:  Abnormal gait, Decreased activity tolerance, Decreased mobility, Difficulty walking, Decreased strength, Increased fascial restricitons, Increased muscle spasms, Postural dysfunction, Pain, Impaired sensation  Visit Diagnosis: 1. Other abnormalities of gait and mobility   2. Chronic bilateral low back pain with bilateral sciatica        Problem List Patient Active Problem List   Diagnosis Date Noted  . Microcytic anemia 10/25/2017  . Constipation 10/25/2017  . Hematochezia 07/31/2017  . Dysphagia 07/31/2017  . ANEMIA-NOS 07/06/2006  . DEPRESSION 07/06/2006  . COMMON MIGRAINE 07/06/2006  . HYPERTENSION 07/06/2006  . GERD (gastroesophageal reflux disease) 07/06/2006  . IRRITABLE BOWEL SYNDROME 07/06/2006  . INFECTION,  URINARY TRACT NOS 07/06/2006  . DYSFUNCTIONAL UTERINE BLEEDING 07/06/2006  . LOW BACK PAIN 07/06/2006   Virgina Organynthia Agostino Gorin, PT CLT [phone] 01/31/2019, 4:34 PM  [facility] [address] [place], [place], [address] Phone: [phone]   Fax:  [phone]  Name: Dartha LodgeSharon M Gatchel MRN: [id number] Date of Birth: 1960-06-21

## 2019-02-04 ENCOUNTER — Ambulatory Visit (HOSPITAL_COMMUNITY): Payer: No Typology Code available for payment source

## 2019-02-04 ENCOUNTER — Telehealth (HOSPITAL_COMMUNITY): Payer: Self-pay

## 2019-02-04 NOTE — Telephone Encounter (Signed)
No show, called and spoke to pt. who stated she had her days missed up.  Reminded next apt date and time and contact number given.    [address] [place]; CBIS [phone]

## 2019-02-06 ENCOUNTER — Other Ambulatory Visit: Payer: Self-pay

## 2019-02-06 ENCOUNTER — Encounter (HOSPITAL_COMMUNITY): Payer: Self-pay

## 2019-02-06 ENCOUNTER — Ambulatory Visit (HOSPITAL_COMMUNITY): Payer: No Typology Code available for payment source

## 2019-02-06 DIAGNOSIS — G8929 Other chronic pain: Secondary | ICD-10-CM

## 2019-02-06 DIAGNOSIS — R2689 Other abnormalities of gait and mobility: Secondary | ICD-10-CM | POA: Diagnosis not present

## 2019-02-06 DIAGNOSIS — M5441 Lumbago with sciatica, right side: Secondary | ICD-10-CM

## 2019-02-06 NOTE — Therapy (Signed)
[facility] [address] [place], [place], [address] Phone: [phone]   Fax:  [phone]  Physical Therapy Treatment  Patient Details  Name: Vicki Dean MRN: [id number] Date of Birth: 1961-04-03 Referring Provider (PT): Lajean ManesKelly, Tierra L., FNP   Encounter Date: 02/06/2019  PT End of Session - 02/06/19 1318    Visit Number  7    Number of Visits  15    Date for PT Re-Evaluation  02/20/19    Authorization Type  [place] (15 visits approved for 120 days)    Authorization Time Period  01/02/19-02/20/19    Authorization - Visit Number  7    Authorization - Number of Visits  15    PT Start Time  1314    PT Stop Time  1356    PT Time Calculation (min)  42 min    Activity Tolerance  Patient tolerated treatment well;No increased pain    Behavior During Therapy  WFL for tasks assessed/performed       Past Medical History:  Diagnosis Date  . Anxiety   . Arthritis    possibly her right shoulder.  . Chronic back pain   . COPD (chronic obstructive pulmonary disease) ([facility])   . Fibromyalgia   . GERD (gastroesophageal reflux disease)   . Hypertension   . Sleep apnea    could not tolerate    Past Surgical History:  Procedure Laterality Date  . ABDOMINAL HYSTERECTOMY    . CARPAL TUNNEL RELEASE Left   . COLONOSCOPY WITH PROPOFOL N/A 08/23/2017   Dr. Jena Gaussourk, grade II hemorrhoids. next TCS in 10 years.   . ESOPHAGOGASTRODUODENOSCOPY (EGD) WITH PROPOFOL N/A 08/23/2017   Dr. Jena Gaussourk: mild erosive reflux esophagitis s/p dilation due to h/o dysphagia.   Marland Kitchen. FOOT SURGERY Right    screws from fracture  . GANGLION CYST EXCISION Right    foot  . KNEE SURGERY Right    arthroscopy  . MALONEY DILATION N/A 08/23/2017   Procedure: Elease HashimotoMALONEY DILATION;  Surgeon: Corbin Adeourk, Robert M, MD;  [facility];  Service: Endoscopy;  Laterality: N/A;    There were no vitals filed for this visit.  Subjective Assessment - 02/06/19 1311    Subjective  Pt stated the pain  comes and goes, current pain scale 5/10 lower back.    Currently in Pain?  Yes    Pain Score  5     Pain Location  Back    Pain Orientation  Lower   center of lower back   Pain Descriptors / Indicators  Aching    Pain Type  Chronic pain    Pain Onset  More than a month ago    Pain Frequency  Intermittent    Aggravating Factors   activity    Pain Relieving Factors  not sure    Effect of Pain on Daily Activities  severe limitation                       [facility] Adult PT Treatment/Exercise - 02/06/19 0001      Exercises   Exercises  Lumbar      Lumbar Exercises: Stretches   Active Hamstring Stretch  2 reps;30 seconds    Active Hamstring Stretch Limitations  supine hands behind knee    Single Knee to Chest Stretch  2 reps;20 seconds    Single Knee to Chest Stretch Limitations  BLE    Lower Trunk Rotation  10 seconds  Lower Trunk Rotation Limitations  10 reps; 10" holds      Lumbar Exercises: Standing   Heel Raises  10 reps    Heel Raises Limitations  Toe raises on slope    Functional Squats  10 reps    Functional Squats Limitations  minisquat infront of chair    Other Standing Lumbar Exercises  3D hip excursion with UE reaches for thoracic mobility with sidebend and rotatoin (STS instead of squat)      Lumbar Exercises: Seated   Other Seated Lumbar Exercises  sitting tall.  scapular retractoin    Other Seated Lumbar Exercises  rows wiht RTB 10x3"      Lumbar Exercises: Supine   Bent Knee Raise  20 reps    Bent Knee Raise Limitations  alternating sides 3'' holds    Bridge  10 reps    Bridge Limitations  full ROM; glute set before rising to reduce LBP      Lumbar Exercises: Sidelying   Clam  Right;Left;10 reps;3 seconds    Clam Limitations  RTB      Manual Therapy   Manual Therapy  Soft tissue mobilization    Manual therapy comments  completed seperate from other skilled interventions    Soft tissue mobilization  Prone STM lumbar paraspinals and Bil  gluteal mm               PT Short Term Goals - 01/16/19 1001      PT SHORT TERM GOAL #1   Title  Patient will be independent with HEP, updated PRN, to improve mobility and functional strength.    Time  2    Period  Weeks    Status  On-going    Target Date  01/16/19      PT SHORT TERM GOAL #2   Title  Patient will have no increase in pain with AROM for lumbar spine to improve activity/position tolerance.    Time  3    Period  Weeks    Status  On-going    Target Date  01/23/19        PT Long Term Goals - 01/16/19 1001      PT LONG TERM GOAL #1   Title  Patient will be able to ambulate 10 minutes with LRAD to improve independence with mobility, community access, and ability to exercise via walking program.    Time  7    Period  Weeks    Status  On-going      PT LONG TERM GOAL #2   Title  Patient will be able to perform deadlift with proper form and up to 10lbs to demonstrate improved tolerance to loading lumbar spine during lifting activity.    Time  7    Period  Weeks    Status  On-going      PT LONG TERM GOAL #3   Title  Patient will be able to sit for 15 minutes without increase in pain indicating improve tolerance to activity and reduced irritability of symptoms.    Time  7    Period  Weeks    Status  On-going            Plan - 02/06/19 1344    Clinical Impression Statement  Continued with lumbar mobility exercises and stretches along with core and proximal strengtheing.  Pt educated on importance of posture for pain control.  Added postural exercises to POC.  EOS wtih manual for pain control, generalized tightness though no  spasms noted.  Pt with tenderness over Lt PSIS wiht palpation.  No reports of increased pain through session.    Personal Factors and Comorbidities  Age;Comorbidity 3+;Past/Current Experience    Comorbidities  HTN, COPD, Fibromyalgia, depression    Examination-Activity Limitations  Locomotion Level;Bed  Mobility;Bend;Lift;Squat;Stand;Sit;Sleep    Examination-Participation Restrictions  Community Activity;Laundry    Stability/Clinical Decision Making  Evolving/Moderate complexity    Clinical Decision Making  Moderate    Rehab Potential  Fair    PT Frequency  2x / week    PT Duration  --   7 weeks (VA approved for 15 visits)   PT Treatment/Interventions  ADLs/Self Care Home Management;Aquatic Therapy;Cryotherapy;Electrical Stimulation;Iontophoresis 4mg /ml Dexamethasone;Moist Heat;Traction;DME Instruction;Gait training;Stair training;Functional mobility training;Therapeutic activities;Therapeutic exercise;Balance training;Neuromuscular re-education;Patient/family education;Manual techniques;Passive range of motion;Dry needling;Taping;Spinal Manipulations;Joint Manipulations    PT Next Visit Plan  Strengthening for core, bil hips and postural muscles. Stretching for pain relief and to improve any muscle restrictions. Consider lumbar AROM exercises/ Perform lateral lumbar mobilization for pain relief. Manual STM for pain relief and to reduce palpable restrictions PRN.    PT Home Exercise Plan  provide gentle ROM at start based on position tolerance/response; 7/21: seated HS stretch, STS; 7/30: SKTC, LTR, sciantic nerve glides, sidelying clams, bridges       Patient will benefit from skilled therapeutic intervention in order to improve the following deficits and impairments:  Abnormal gait, Decreased activity tolerance, Decreased mobility, Difficulty walking, Decreased strength, Increased fascial restricitons, Increased muscle spasms, Postural dysfunction, Pain, Impaired sensation  Visit Diagnosis: Other abnormalities of gait and mobility  Chronic bilateral low back pain with bilateral sciatica     Problem List Patient Active Problem List   Diagnosis Date Noted  . Microcytic anemia 10/25/2017  . Constipation 10/25/2017  . Hematochezia 07/31/2017  . Dysphagia 07/31/2017  . ANEMIA-NOS  07/06/2006  . DEPRESSION 07/06/2006  . COMMON MIGRAINE 07/06/2006  . HYPERTENSION 07/06/2006  . GERD (gastroesophageal reflux disease) 07/06/2006  . IRRITABLE BOWEL SYNDROME 07/06/2006  . INFECTION, URINARY TRACT NOS 07/06/2006  . DYSFUNCTIONAL UTERINE BLEEDING 07/06/2006  . LOW BACK PAIN 07/06/2006   Ihor Austin, [place]; [place]  Aldona Lento 02/06/2019, 2:15 PM  [place] [address] [place], [place], [address] Phone: [phone]   Fax:  [phone]  Name: Vicki Dean MRN: [id number] Date of Birth: 17-Sep-1960

## 2019-02-10 ENCOUNTER — Telehealth (HOSPITAL_COMMUNITY): Payer: Self-pay

## 2019-02-10 NOTE — Telephone Encounter (Signed)
R[facility]y called requested that progress notes from eval be fax to [phone] attn: [place].

## 2019-02-11 ENCOUNTER — Encounter (HOSPITAL_COMMUNITY): Payer: Self-pay

## 2019-02-11 ENCOUNTER — Ambulatory Visit (HOSPITAL_COMMUNITY): Payer: No Typology Code available for payment source

## 2019-02-11 ENCOUNTER — Other Ambulatory Visit: Payer: Self-pay

## 2019-02-11 DIAGNOSIS — R2689 Other abnormalities of gait and mobility: Secondary | ICD-10-CM | POA: Diagnosis not present

## 2019-02-11 DIAGNOSIS — M5442 Lumbago with sciatica, left side: Secondary | ICD-10-CM

## 2019-02-11 DIAGNOSIS — G8929 Other chronic pain: Secondary | ICD-10-CM

## 2019-02-11 NOTE — Therapy (Signed)
[place], [place], [address] Phone: [phone]   Fax:  [phone]  Physical Therapy Treatment  Patient Details  Name: Vicki Dean MRN: [id number] Date of Birth: [birth year] Referring Provider (PT): Jannet Mantis., FNP   Encounter Date: 02/11/2019  PT End of Session - 02/11/19 1320    Visit Number  8    Number of Visits  15    Date for PT Re-Evaluation  02/20/19    Authorization Type  [facility] (15 visits approved for 120 days)    Authorization Time Period  01/02/19-02/20/19    Authorization - Visit Number  8    Authorization - Number of Visits  15    PT Start Time  4235    PT Stop Time  3614    PT Time Calculation (min)  38 min    Activity Tolerance  Patient tolerated treatment well;No increased pain    Behavior During Therapy  WFL for tasks assessed/performed       Past Medical History:  Diagnosis Date  . Anxiety   . Arthritis    possibly her right shoulder.  . Chronic back pain   . COPD (chronic obstructive pulmonary disease) ([place])   . Fibromyalgia   . GERD (gastroesophageal reflux disease)   . Hypertension   . Sleep apnea    could not tolerate    Past Surgical History:  Procedure Laterality Date  . ABDOMINAL HYSTERECTOMY    . CARPAL TUNNEL RELEASE Left   . COLONOSCOPY WITH PROPOFOL N/A 08/23/2017   Dr. Gala Romney, grade II hemorrhoids. next TCS in 10 years.   . ESOPHAGOGASTRODUODENOSCOPY (EGD) WITH PROPOFOL N/A 08/23/2017   Dr. Gala Romney: mild erosive reflux esophagitis s/p dilation due to h/o dysphagia.   Marland Kitchen FOOT SURGERY Right    screws from fracture  . GANGLION CYST EXCISION Right    foot  . KNEE SURGERY Right    arthroscopy  . MALONEY DILATION N/A 08/23/2017   Procedure: Venia Minks DILATION;  Surgeon: Daneil Dolin, MD;  [facility];  Service: Endoscopy;  Laterality: N/A;    There were no vitals filed for this visit.  Subjective Assessment - 02/11/19 1315    Subjective  Pt stated she is  "doing okay today".  Current pain scale 6/10 center of lower back    Patient Stated Goals  to be able to move and get up easier    Currently in Pain?  Yes    Pain Score  6     Pain Location  Back    Pain Orientation  Lower    Pain Descriptors / Indicators  Aching    Pain Type  Chronic pain    Pain Onset  More than a month ago    Pain Frequency  Intermittent    Aggravating Factors   activity    Pain Relieving Factors  not sure    Effect of Pain on Daily Activities  severe limitation         [facility] PT Assessment - 02/11/19 0001      Assessment   Medical Diagnosis  Chronic Low Back Pain    Referring Provider (PT)  Jannet Mantis., FNP    Onset Date/Surgical Date  09/28/18    Next MD Visit  not scheduled                   [facility] Adult PT Treatment/Exercise - 02/11/19 0001      Exercises  Exercises  Lumbar      Lumbar Exercises: Stretches   Active Hamstring Stretch  2 reps;30 seconds    Active Hamstring Stretch Limitations  supine hands behind knee    Double Knee to Chest Stretch  2 reps;20 seconds    Double Knee to Chest Stretch Limitations  towel assistance    Lower Trunk Rotation  5 reps;10 seconds    Figure 4 Stretch  2 reps;20 seconds    Figure 4 Stretch Limitations  BLE, supine towel assistance      Lumbar Exercises: Standing   Heel Raises  15 reps    Heel Raises Limitations  Toe raises on slope    Functional Squats  10 reps    Functional Squats Limitations  3D hip excursion minisquat infront of chair    Forward Lunge  10 reps    Forward Lunge Limitations  intermittent HHA on 6in step    Row  10 reps;Theraband    Theraband Level (Row)  Level 2 (Red)    Shoulder Extension  10 reps;Theraband    Theraband Level (Shoulder Extension)  Level 2 (Red)    Other Standing Lumbar Exercises  3D hip excursion with UE reaches for thoracic mobility with sidebend and rotatoin (squats infront of chair)      Lumbar Exercises: Supine   Bridge  15 reps    Bridge  Limitations  full ROM; glute set before rising to reduce LBP               PT Short Term Goals - 01/16/19 1001      PT SHORT TERM GOAL #1   Title  Patient will be independent with HEP, updated PRN, to improve mobility and functional strength.    Time  2    Period  Weeks    Status  On-going    Target Date  01/16/19      PT SHORT TERM GOAL #2   Title  Patient will have no increase in pain with AROM for lumbar spine to improve activity/position tolerance.    Time  3    Period  Weeks    Status  On-going    Target Date  01/23/19        PT Long Term Goals - 01/16/19 1001      PT LONG TERM GOAL #1   Title  Patient will be able to ambulate 10 minutes with LRAD to improve independence with mobility, community access, and ability to exercise via walking program.    Time  7    Period  Weeks    Status  On-going      PT LONG TERM GOAL #2   Title  Patient will be able to perform deadlift with proper form and up to 10lbs to demonstrate improved tolerance to loading lumbar spine during lifting activity.    Time  7    Period  Weeks    Status  On-going      PT LONG TERM GOAL #3   Title  Patient will be able to sit for 15 minutes without increase in pain indicating improve tolerance to activity and reduced irritability of symptoms.    Time  7    Period  Weeks    Status  On-going            Plan - 02/11/19 1341    Clinical Impression Statement  Continued with established POC focusing on lumbar mobility and functional strengthening.  Added lunges and therabands for postural strengthening.  Pt with intermittent HHA required for LOB episodes during lunges.  MIn cueing for form with new exercises.  No reports of increased pain through session.    Personal Factors and Comorbidities  Age;Comorbidity 3+;Past/Current Experience    Comorbidities  HTN, COPD, Fibromyalgia, depression    Examination-Activity Limitations  Locomotion Level;Bed Mobility;Bend;Lift;Squat;Stand;Sit;Sleep     Examination-Participation Restrictions  Community Activity;Laundry    Stability/Clinical Decision Making  Evolving/Moderate complexity    Clinical Decision Making  Moderate    Rehab Potential  Fair    PT Frequency  2x / week    PT Duration  --   7 weeks ([facility] approved for 15 visits   PT Treatment/Interventions  ADLs/Self Care Home Management;Aquatic Therapy;Cryotherapy;Electrical Stimulation;Iontophoresis 4mg /ml Dexamethasone;Moist Heat;Traction;DME Instruction;Gait training;Stair training;Functional mobility training;Therapeutic activities;Therapeutic exercise;Balance training;Neuromuscular re-education;Patient/family education;Manual techniques;Passive range of motion;Dry needling;Taping;Spinal Manipulations;Joint Manipulations    PT Next Visit Plan  Strengthening for core, bil hips and postural muscles. Stretching for pain relief and to improve any muscle restrictions. Consider lumbar AROM exercises/ Perform lateral lumbar mobilization for pain relief. Manual STM for pain relief and to reduce palpable restrictions PRN.    PT Home Exercise Plan  provide gentle ROM at start based on position tolerance/response; 7/21: seated HS stretch, STS; 7/30: SKTC, LTR, sciantic nerve glides, sidelying clams, bridges       Patient will benefit from skilled therapeutic intervention in order to improve the following deficits and impairments:  Abnormal gait, Decreased activity tolerance, Decreased mobility, Difficulty walking, Decreased strength, Increased fascial restricitons, Increased muscle spasms, Postural dysfunction, Pain, Impaired sensation  Visit Diagnosis: Chronic bilateral low back pain with bilateral sciatica  Other abnormalities of gait and mobility     Problem List Patient Active Problem List   Diagnosis Date Noted  . Microcytic anemia 10/25/2017  . Constipation 10/25/2017  . Hematochezia 07/31/2017  . Dysphagia 07/31/2017  . ANEMIA-NOS 07/06/2006  . DEPRESSION 07/06/2006  . COMMON  MIGRAINE 07/06/2006  . HYPERTENSION 07/06/2006  . GERD (gastroesophageal reflux disease) 07/06/2006  . IRRITABLE BOWEL SYNDROME 07/06/2006  . INFECTION, URINARY TRACT NOS 07/06/2006  . DYSFUNCTIONAL UTERINE BLEEDING 07/06/2006  . LOW BACK PAIN 07/06/2006   Becky Saxasey Yarel Kilcrease, LPTA; CBIS [phone]  Juel BurrowCockerham, Eugina Row Jo 02/11/2019, 2:02 PM  [facility] [address] [place], [place], [address] Phone: [phone]   Fax:  [phone]  Name: Dartha LodgeSharon M Dean MRN: [id number] Date of Birth: January 08, 1961

## 2019-02-14 ENCOUNTER — Telehealth (HOSPITAL_COMMUNITY): Payer: Self-pay

## 2019-02-14 ENCOUNTER — Ambulatory Visit (HOSPITAL_COMMUNITY): Payer: No Typology Code available for payment source

## 2019-02-14 NOTE — Telephone Encounter (Signed)
No show, called and left message concerning missed apt.  Reminded next apt date and time wiht contact information.  Reminded no show policy.  [address], [place]; CBIS [phone]

## 2019-02-19 ENCOUNTER — Encounter (HOSPITAL_COMMUNITY): Payer: Self-pay

## 2019-02-19 ENCOUNTER — Ambulatory Visit (HOSPITAL_COMMUNITY): Payer: No Typology Code available for payment source | Attending: Family

## 2019-02-19 ENCOUNTER — Other Ambulatory Visit: Payer: Self-pay

## 2019-02-19 DIAGNOSIS — M5442 Lumbago with sciatica, left side: Secondary | ICD-10-CM | POA: Insufficient documentation

## 2019-02-19 DIAGNOSIS — M5441 Lumbago with sciatica, right side: Secondary | ICD-10-CM | POA: Insufficient documentation

## 2019-02-19 DIAGNOSIS — R2689 Other abnormalities of gait and mobility: Secondary | ICD-10-CM | POA: Insufficient documentation

## 2019-02-19 DIAGNOSIS — G8929 Other chronic pain: Secondary | ICD-10-CM | POA: Insufficient documentation

## 2019-02-19 NOTE — Therapy (Signed)
[place], [place], [address] Phone: [phone]   Fax:  [phone]  Physical Therapy Treatment & Progress Note  Patient Details  Name: Vicki Dean MRN: [id number] Date of Birth: 1960/08/28 Referring Provider (PT): Jannet Mantis., FNP   Encounter Date: 02/19/2019   Progress Note Reporting Period 01/02/19 to 02/19/19  See note below for Objective Data and Assessment of Progress/Goals.    PT End of Session - 02/19/19 1706    Visit Number  9    Number of Visits  15    Date for PT Re-Evaluation  03/19/19    Authorization Type  [facility] (15 visits approved for 120 days)    Authorization Time Period  01/02/19-02/20/19; 02/19/19-03/19/19    Authorization - Visit Number  9    Authorization - Number of Visits  15    PT Start Time  9758    PT Stop Time  1444    PT Time Calculation (min)  39 min    Activity Tolerance  Patient tolerated treatment well;No increased pain    Behavior During Therapy  WFL for tasks assessed/performed       Past Medical History:  Diagnosis Date  . Anxiety   . Arthritis    possibly her right shoulder.  . Chronic back pain   . COPD (chronic obstructive pulmonary disease) ([place])   . Fibromyalgia   . GERD (gastroesophageal reflux disease)   . Hypertension   . Sleep apnea    could not tolerate    Past Surgical History:  Procedure Laterality Date  . ABDOMINAL HYSTERECTOMY    . CARPAL TUNNEL RELEASE Left   . COLONOSCOPY WITH PROPOFOL N/A 08/23/2017   Dr. Gala Romney, grade II hemorrhoids. next TCS in 10 years.   . ESOPHAGOGASTRODUODENOSCOPY (EGD) WITH PROPOFOL N/A 08/23/2017   Dr. Gala Romney: mild erosive reflux esophagitis s/p dilation due to h/o dysphagia.   Marland Kitchen FOOT SURGERY Right    screws from fracture  . GANGLION CYST EXCISION Right    foot  . KNEE SURGERY Right    arthroscopy  . MALONEY DILATION N/A 08/23/2017   Procedure: Venia Minks DILATION;  Surgeon: Daneil Dolin, MD;  [facility];   Service: Endoscopy;  Laterality: N/A;    There were no vitals filed for this visit.  Subjective Assessment - 02/19/19 1408    Subjective  Pt reports her left hip is really bothering her and she feels that is waht stops her when walking. She reports her back pain bothers her also but the hip feels more limiting.    Limitations  Sitting;Lifting;Standing;Walking;House hold activities    Patient Stated Goals  to be able to move and get up easier    Currently in Pain?  Yes    Pain Score  6     Pain Location  Back    Pain Orientation  Lower    Pain Descriptors / Indicators  Aching    Pain Type  Chronic pain    Pain Radiating Towards  into her Lt hip, unsure if related to one another    Pain Onset  More than a month ago    Pain Frequency  Intermittent    Aggravating Factors   walking    Pain Relieving Factors  manual therapy    Effect of Pain on Daily Activities  severe         [facility] PT Assessment - 02/19/19 0001      Assessment  Medical Diagnosis  Chronic Low Back Pain    Referring Provider (PT)  Jannet Mantis., FNP    Onset Date/Surgical Date  09/28/18    Next MD Visit  not scheduled      Precautions   Precautions  None      Restrictions   Weight Bearing Restrictions  No      Prior Function   Level of Independence  Independent;Independent with community mobility with device;Independent with household mobility with device    Vocation  On disability      Cognition   Overall Cognitive Status  Within Functional Limits for tasks assessed      AROM   Lumbar Flexion  25% limited   remains painful (was 75% limited)   Lumbar Extension  50% limited   remains painful (was 75% limited)   Lumbar - Right Side Bend  25% limited   remains painful (was 75% limited)   Lumbar - Left Side Bend  25% limited   remains painful (was 75% limited)   Lumbar - Right Rotation  50% limited   remains painful (was 75% limited)   Lumbar - Left Rotation  50% limited   remains painful (was 75%  limited)     Ambulation/Gait   Ambulation/Gait  Yes    Ambulation/Gait Assistance  6: Modified independent (Device/Increase time)    Ambulation Distance (Feet)  84 Feet   was 28 feet   Gait Pattern  Step-through pattern;Decreased stride length;Decreased hip/knee flexion - right;Decreased hip/knee flexion - left;Trendelenburg;Antalgic;Trunk flexed;Wide base of support    Ambulation Surface  Level;Indoor    Gait velocity  0.21 m/s   2 standign rest breaks   Gait Comments  pt stopped at 1:31 second durign 2MWT (pt only made it to 30 seconds on last attempt on 01/07/19)       [facility] Adult PT Treatment/Exercise - 02/19/19 0001      Manual Therapy   Manual Therapy  Joint mobilization;Manual Traction    Manual therapy comments  completed seperate from other skilled interventions    Joint Mobilization  Lateral glide to Lt hip, pt in supine, grade III/IV, 4x 30-45 second oscillations    Manual Traction  Manual traction to Lt hip for pain relief, 6x 45 seconds        PT Education - 02/19/19 1805    Education Details  Educated on plan to request additional visits with [facility] for [facility]. Educated on purpose of interventions.    Person(s) Educated  Patient    Methods  Explanation    Comprehension  Need further instruction;Verbalized understanding       PT Short Term Goals - 02/19/19 1807      PT SHORT TERM GOAL #1   Title  Patient will be independent with HEP, updated PRN, to improve mobility and functional strength.    Baseline  pt reports she is performing exercises when her pain is not severe    Time  2    Period  Weeks    Status  Partially Met    Target Date  01/16/19      PT SHORT TERM GOAL #2   Title  Patient will have no increase in pain with AROM for lumbar spine to improve activity/position tolerance.    Baseline  pts mobility has increased however she continues to have pain    Time  3    Period  Weeks    Status  On-going    Target Date  01/23/19  PT Long Term Goals  - 02/19/19 1807      PT LONG TERM GOAL #1   Title  Patient will be able to ambulate 10 minutes with LRAD to improve independence with mobility, community access, and ability to exercise via walking program.    Baseline  pt ambulate 1:30 before requiring rest break today    Time  7    Period  Weeks    Status  On-going      PT LONG TERM GOAL #2   Title  Patient will be able to perform deadlift with proper form and up to 10lbs to demonstrate improved tolerance to loading lumbar spine during lifting activity.    Time  7    Period  Weeks    Status  On-going      PT LONG TERM GOAL #3   Title  Patient will be able to sit for 15 minutes without increase in pain indicating improve tolerance to activity and reduced irritability of symptoms.    Time  7    Period  Weeks    Status  On-going            Plan - 02/19/19 1419    Clinical Impression Statement  Patient re-assessed today and educated on improvements in ROM and distanced walked during 2MWT. She remains limited by lumbar pain and Lt hip pain and was educated today on "c-sign" pattern and possible source of pain being form her hip. Interventions today focused on pain relief with manual traction and mobilization to Lt hip. She reported good reduction in pain in Lt hip and back with interventions and appeared to have reduced difficulty ambulating after interventions. She will continue to benefit from skilled PT interventions to address on going impairments and improve functional mobility an QOL. Patient has also reported concerns of Rt shoulder pain and she may benefit from skilled OT evaluation and treatment to address functional impairments related to shoulder mobility.    Personal Factors and Comorbidities  Age;Comorbidity 3+;Past/Current Experience    Comorbidities  HTN, COPD, Fibromyalgia, depression    Examination-Activity Limitations  Locomotion Level;Bed Mobility;Bend;Lift;Squat;Stand;Sit;Sleep    Examination-Participation  Restrictions  Community Activity;Laundry    Stability/Clinical Decision Making  Evolving/Moderate complexity    Rehab Potential  Fair    PT Frequency  2x / week    PT Duration  4 weeks   additional weeks   PT Treatment/Interventions  ADLs/Self Care Home Management;Aquatic Therapy;Cryotherapy;Electrical Stimulation;Iontophoresis 32m/ml Dexamethasone;Moist Heat;Traction;DME Instruction;Gait training;Stair training;Functional mobility training;Therapeutic activities;Therapeutic exercise;Balance training;Neuromuscular re-education;Patient/family education;Manual techniques;Passive range of motion;Dry needling;Taping;Spinal Manipulations;Joint Manipulations    PT Next Visit Plan  Focus on manual interventions for Lt hip mobility with distraction and lateral glides. Perform hip strengthening and if in standing add lateral glide/distraction to Lt hip for pain management. Perform lateral lumbar mobilization for pain relief. Manual STM for pain relief and to reduce palpable restrictions PRN.    PT Home Exercise Plan  provide gentle ROM at start based on position tolerance/response; 7/21: seated HS stretch, STS; 7/30: SKTC, LTR, sciantic nerve glides, sidelying clams, bridges    Consulted and Agree with Plan of Care  Patient       Patient will benefit from skilled therapeutic intervention in order to improve the following deficits and impairments:  Abnormal gait, Decreased activity tolerance, Decreased mobility, Difficulty walking, Decreased strength, Increased fascial restricitons, Increased muscle spasms, Postural dysfunction, Pain, Impaired sensation  Visit Diagnosis: Chronic bilateral low back pain with bilateral sciatica  Other abnormalities of gait and  mobility     Problem List Patient Active Problem List   Diagnosis Date Noted  . Microcytic anemia 10/25/2017  . Constipation 10/25/2017  . Hematochezia 07/31/2017  . Dysphagia 07/31/2017  . ANEMIA-NOS 07/06/2006  . DEPRESSION 07/06/2006  .  COMMON MIGRAINE 07/06/2006  . HYPERTENSION 07/06/2006  . GERD (gastroesophageal reflux disease) 07/06/2006  . IRRITABLE BOWEL SYNDROME 07/06/2006  . INFECTION, URINARY TRACT NOS 07/06/2006  . DYSFUNCTIONAL UTERINE BLEEDING 07/06/2006  . LOW BACK PAIN 07/06/2006    Kipp Brood, PT, DPT, [facility] Physical Therapist with [facility]  02/19/2019 6:18 PM    [place] [address] [place], [place], [address] Phone: [phone]   Fax:  [phone]  Name: Vicki Dean MRN: [id number] Date of Birth: March 13, 1961

## 2019-02-21 ENCOUNTER — Ambulatory Visit (HOSPITAL_COMMUNITY): Payer: No Typology Code available for payment source

## 2019-02-21 ENCOUNTER — Telehealth (HOSPITAL_COMMUNITY): Payer: Self-pay

## 2019-02-21 NOTE — Addendum Note (Signed)
Addended by: Jacques Navy on: 02/21/2019 03:40 PM   Modules accepted: Orders

## 2019-02-21 NOTE — Telephone Encounter (Signed)
No show, called home number and spoke to pt.'s mother who stated she was on her way to therapy.  Attemped to call mobile phone wiht no answer, did leave messaage concerning missed apt.  Reminded next apt date and time with contact   [place], [place]; CBIS [phone]

## 2019-02-21 NOTE — Addendum Note (Signed)
Addended by: Jacques Navy on: 02/21/2019 03:37 PM   Modules accepted: Orders

## 2019-02-25 ENCOUNTER — Ambulatory Visit (HOSPITAL_COMMUNITY): Payer: No Typology Code available for payment source

## 2019-02-25 ENCOUNTER — Other Ambulatory Visit: Payer: Self-pay

## 2019-02-25 ENCOUNTER — Encounter (HOSPITAL_COMMUNITY): Payer: Self-pay

## 2019-02-25 DIAGNOSIS — R2689 Other abnormalities of gait and mobility: Secondary | ICD-10-CM

## 2019-02-25 DIAGNOSIS — M5442 Lumbago with sciatica, left side: Secondary | ICD-10-CM | POA: Diagnosis not present

## 2019-02-25 DIAGNOSIS — G8929 Other chronic pain: Secondary | ICD-10-CM

## 2019-02-25 NOTE — Therapy (Signed)
[place], [place], [address] Phone: [phone]   Fax:  [phone]  Physical Therapy Treatment  Patient Details  Name: Vicki Dean MRN: [id number] Date of Birth: 1961/03/24 Referring Provider (PT): Jannet Mantis., FNP   Encounter Date: 02/25/2019  PT End of Session - 02/25/19 1357    Visit Number  10    Number of Visits  15    Date for PT Re-Evaluation  03/19/19    Authorization Type  [facility] (15 visits approved for 120 days)    Authorization Time Period  01/02/19-02/20/19; 02/19/19-03/19/19    Authorization - Visit Number  10    Authorization - Number of Visits  15    PT Start Time  1884   pt on phone, delayed start   PT Stop Time  1356    PT Time Calculation (min)  33 min    Activity Tolerance  Patient tolerated treatment well;No increased pain    Behavior During Therapy  WFL for tasks assessed/performed       Past Medical History:  Diagnosis Date  . Anxiety   . Arthritis    possibly her right shoulder.  . Chronic back pain   . COPD (chronic obstructive pulmonary disease) ([place])   . Fibromyalgia   . GERD (gastroesophageal reflux disease)   . Hypertension   . Sleep apnea    could not tolerate    Past Surgical History:  Procedure Laterality Date  . ABDOMINAL HYSTERECTOMY    . CARPAL TUNNEL RELEASE Left   . COLONOSCOPY WITH PROPOFOL N/A 08/23/2017   Dr. Gala Romney, grade II hemorrhoids. next TCS in 10 years.   . ESOPHAGOGASTRODUODENOSCOPY (EGD) WITH PROPOFOL N/A 08/23/2017   Dr. Gala Romney: mild erosive reflux esophagitis s/p dilation due to h/o dysphagia.   Marland Kitchen FOOT SURGERY Right    screws from fracture  . GANGLION CYST EXCISION Right    foot  . KNEE SURGERY Right    arthroscopy  . MALONEY DILATION N/A 08/23/2017   Procedure: Venia Minks DILATION;  Surgeon: Daneil Dolin, MD;  [facility];  Service: Endoscopy;  Laterality: N/A;    There were no vitals filed for this visit.  Subjective Assessment -  02/25/19 1313    Subjective  Pt reports the manual helps last session with her hip.  Current pain scale Lt hip and 5/10    Patient Stated Goals  to be able to move and get up easier    Currently in Pain?  Yes    Pain Score  5     Pain Location  Back    Pain Orientation  Lower;Left    Pain Descriptors / Indicators  Aching    Pain Type  Chronic pain    Pain Radiating Towards  into her Lt hip, unsure if related to one another    Pain Onset  More than a month ago    Pain Frequency  Intermittent    Aggravating Factors   walking    Pain Relieving Factors  manual therapy    Effect of Pain on Daily Activities  severe                       [facility] Adult PT Treatment/Exercise - 02/25/19 0001      Exercises   Exercises  Lumbar      Lumbar Exercises: Standing   Forward Lunge  10 reps    Forward Lunge Limitations  with belt  for lateral glide     Other Standing Lumbar Exercises  hip abd/ext 10x with belt for lateral gliding Lt hip for pain control      Manual Therapy   Manual Therapy  Joint mobilization;Manual Traction    Manual therapy comments  completed seperate from other skilled interventions    Joint Mobilization  Lateral glide to Lt hip, pt in supine, grade III/IV, 4x 30-45 second oscillations    Manual Traction  Manual traction to Lt hip for pain relief, 6x 45 seconds               PT Short Term Goals - 02/19/19 1807      PT SHORT TERM GOAL #1   Title  Patient will be independent with HEP, updated PRN, to improve mobility and functional strength.    Baseline  pt reports she is performing exercises when her pain is not severe    Time  2    Period  Weeks    Status  Partially Met    Target Date  01/16/19      PT SHORT TERM GOAL #2   Title  Patient will have no increase in pain with AROM for lumbar spine to improve activity/position tolerance.    Baseline  pts mobility has increased however she continues to have pain    Time  3    Period  Weeks    Status   On-going    Target Date  01/23/19        PT Long Term Goals - 02/19/19 1807      PT LONG TERM GOAL #1   Title  Patient will be able to ambulate 10 minutes with LRAD to improve independence with mobility, community access, and ability to exercise via walking program.    Baseline  pt ambulate 1:30 before requiring rest break today    Time  7    Period  Weeks    Status  On-going      PT LONG TERM GOAL #2   Title  Patient will be able to perform deadlift with proper form and up to 10lbs to demonstrate improved tolerance to loading lumbar spine during lifting activity.    Time  7    Period  Weeks    Status  On-going      PT LONG TERM GOAL #3   Title  Patient will be able to sit for 15 minutes without increase in pain indicating improve tolerance to activity and reduced irritability of symptoms.    Time  7    Period  Weeks    Status  On-going            Plan - 02/25/19 1501    Clinical Impression Statement  Began session with manual Lt hip distraciton and lateral glides with reports of improved tolerance wiht weight bearing activities.  Added hip strengthening exercises with focus on gluteal strengthening.  Pt report increased pain wiht Lt LE weight bearing.  Standing exercises complete with lateral traction with belt on parallel bars to improve tolerance for LE strengthening.  EOS reports pain reduced to 3/10 (was originally 5/10).    Personal Factors and Comorbidities  Age;Comorbidity 3+;Past/Current Experience    Comorbidities  HTN, COPD, Fibromyalgia, depression    Examination-Activity Limitations  Locomotion Level;Bed Mobility;Bend;Lift;Squat;Stand;Sit;Sleep    Examination-Participation Restrictions  Community Activity;Laundry    Stability/Clinical Decision Making  Evolving/Moderate complexity    Clinical Decision Making  Moderate    Rehab Potential  Fair  PT Frequency  2x / week    PT Duration  4 weeks   additional weeks   PT Treatment/Interventions  ADLs/Self Care  Home Management;Aquatic Therapy;Cryotherapy;Electrical Stimulation;Iontophoresis 109m/ml Dexamethasone;Moist Heat;Traction;DME Instruction;Gait training;Stair training;Functional mobility training;Therapeutic activities;Therapeutic exercise;Balance training;Neuromuscular re-education;Patient/family education;Manual techniques;Passive range of motion;Dry needling;Taping;Spinal Manipulations;Joint Manipulations    PT Next Visit Plan  Focus on manual interventions for Lt hip mobility with distraction and lateral glides. Perform hip strengthening and if in standing add lateral glide/distraction to Lt hip for pain management. Perform lateral lumbar mobilization for pain relief. Manual STM for pain relief and to reduce palpable restrictions PRN.    PT Home Exercise Plan  provide gentle ROM at start based on position tolerance/response; 7/21: seated HS stretch, STS; 7/30: SKTC, LTR, sciantic nerve glides, sidelying clams, bridges       Patient will benefit from skilled therapeutic intervention in order to improve the following deficits and impairments:  Abnormal gait, Decreased activity tolerance, Decreased mobility, Difficulty walking, Decreased strength, Increased fascial restricitons, Increased muscle spasms, Postural dysfunction, Pain, Impaired sensation  Visit Diagnosis: Other abnormalities of gait and mobility  Chronic bilateral low back pain with bilateral sciatica     Problem List Patient Active Problem List   Diagnosis Date Noted  . Microcytic anemia 10/25/2017  . Constipation 10/25/2017  . Hematochezia 07/31/2017  . Dysphagia 07/31/2017  . ANEMIA-NOS 07/06/2006  . DEPRESSION 07/06/2006  . COMMON MIGRAINE 07/06/2006  . HYPERTENSION 07/06/2006  . GERD (gastroesophageal reflux disease) 07/06/2006  . IRRITABLE BOWEL SYNDROME 07/06/2006  . INFECTION, URINARY TRACT NOS 07/06/2006  . DYSFUNCTIONAL UTERINE BLEEDING 07/06/2006  . LOW BACK PAIN 07/06/2006   CIhor Austin LPope  CV[place] CAldona Lento9/01/2019, 3:06 PM  [place] [place] [address]Phone: [phone]  Fax:  [phone] Name: SKEARAH GAYDENMRN: [id number] of Birth: [birth year]

## 2019-02-27 ENCOUNTER — Ambulatory Visit (HOSPITAL_COMMUNITY): Payer: No Typology Code available for payment source | Admitting: Physical Therapy

## 2019-02-27 ENCOUNTER — Telehealth (HOSPITAL_COMMUNITY): Payer: Self-pay | Admitting: Physical Therapy

## 2019-02-27 NOTE — Telephone Encounter (Signed)
Called pt who was not at home re no show.  PT had no showed every other visit if she no shows again discharge.   Rayetta Humphrey, [place] CLT [phone]

## 2019-03-04 ENCOUNTER — Telehealth (HOSPITAL_COMMUNITY): Payer: Self-pay

## 2019-03-04 ENCOUNTER — Ambulatory Visit (HOSPITAL_COMMUNITY): Payer: No Typology Code available for payment source

## 2019-03-04 NOTE — Telephone Encounter (Signed)
03/04/19  Pt left a message to [facility] said that she was sick

## 2019-04-03 ENCOUNTER — Telehealth: Payer: Self-pay | Admitting: Internal Medicine

## 2019-04-03 NOTE — Telephone Encounter (Signed)
Pt has questions about lab orders. Please call her at [phone]

## 2019-04-03 NOTE — Telephone Encounter (Signed)
Patient wanted to let us know she was going to have her labs drawn that was ordered in 07/2018.  I faxed a copy of the orders to [facility].

## 2019-04-08 ENCOUNTER — Ambulatory Visit: Payer: No Typology Code available for payment source | Admitting: Internal Medicine

## 2019-04-09 ENCOUNTER — Other Ambulatory Visit: Payer: Self-pay

## 2019-04-09 ENCOUNTER — Emergency Department (HOSPITAL_COMMUNITY)
Admission: EM | Admit: 2019-04-09 | Discharge: 2019-04-09 | Disposition: A | Discharge status: Home / Self Care | Payer: No Typology Code available for payment source | Attending: Emergency Medicine | Admitting: Emergency Medicine

## 2019-04-09 ENCOUNTER — Encounter (HOSPITAL_COMMUNITY): Payer: Self-pay | Admitting: *Deleted

## 2019-04-09 DIAGNOSIS — Z79899 Other long term (current) drug therapy: Secondary | ICD-10-CM | POA: Diagnosis not present

## 2019-04-09 DIAGNOSIS — Z87891 Personal history of nicotine dependence: Secondary | ICD-10-CM | POA: Insufficient documentation

## 2019-04-09 DIAGNOSIS — J449 Chronic obstructive pulmonary disease, unspecified: Secondary | ICD-10-CM | POA: Insufficient documentation

## 2019-04-09 DIAGNOSIS — M25552 Pain in left hip: Secondary | ICD-10-CM | POA: Diagnosis present

## 2019-04-09 DIAGNOSIS — Z8739 Personal history of other diseases of the musculoskeletal system and connective tissue: Secondary | ICD-10-CM

## 2019-04-09 DIAGNOSIS — I10 Essential (primary) hypertension: Secondary | ICD-10-CM | POA: Insufficient documentation

## 2019-04-09 DIAGNOSIS — M797 Fibromyalgia: Secondary | ICD-10-CM | POA: Insufficient documentation

## 2019-04-09 DIAGNOSIS — M5442 Lumbago with sciatica, left side: Secondary | ICD-10-CM | POA: Diagnosis not present

## 2019-04-09 MED ORDER — METHOCARBAMOL 500 MG PO TABS
500.0000 mg | ORAL_TABLET | Freq: Once | ORAL | Status: AC
  Administered 2019-04-09: 17:00:00 500 mg via ORAL
  Filled 2019-04-09: qty 1

## 2019-04-09 MED ORDER — ONDANSETRON HCL 4 MG PO TABS
4.0000 mg | ORAL_TABLET | Freq: Once | ORAL | Status: AC
  Administered 2019-04-09: 4 mg via ORAL
  Filled 2019-04-09: qty 1

## 2019-04-09 MED ORDER — OXYCODONE-ACETAMINOPHEN 5-325 MG PO TABS
1.0000 | ORAL_TABLET | Freq: Once | ORAL | Status: AC
  Administered 2019-04-09: 1 via ORAL
  Filled 2019-04-09: qty 1

## 2019-04-09 MED ORDER — DEXAMETHASONE SODIUM PHOSPHATE 10 MG/ML IJ SOLN
10.0000 mg | Freq: Once | INTRAMUSCULAR | Status: AC
  Administered 2019-04-09: 10 mg via INTRAMUSCULAR
  Filled 2019-04-09: qty 1

## 2019-04-09 MED ORDER — GABAPENTIN 100 MG PO CAPS
100.0000 mg | ORAL_CAPSULE | Freq: Once | ORAL | Status: AC
  Administered 2019-04-09: 17:00:00 100 mg via ORAL
  Filled 2019-04-09: qty 1

## 2019-04-09 NOTE — ED Triage Notes (Signed)
Pt c/o left hip and back pain x couple of days. Denies injury.

## 2019-04-09 NOTE — Discharge Instructions (Addendum)
Your vital signs are within normal limits.  Your examination favors acute on chronic back pain with sciatica.  Please use a heating pad to your lower back.  Continue to use the lidocaine patches.  Continue your current medications.  Please discuss your breakthrough pains with your physicians at the [facility] were treated in the emergency department with intramuscular steroids, oral narcotic pain medication, Neurontin, and muscle relaxers.  These medications may cause drowsiness, and/or lightheadedness.  Please use caution getting around this evening.

## 2019-04-09 NOTE — ED Provider Notes (Signed)
[facility] EMERGENCY DEPARTMENT Provider Note   CSN: [id number] Arrival date & time: 04/09/19  1401     History   Chief Complaint Chief Complaint  Patient presents with  . Hip Pain  . Back Pain    HPI MOSELLA Dean is a 58 y.o. female.     Patient is a 58 year old female who presents to the emergency department with a complaint of left hip and back pain.  Patient states that she has chronic problems with back and hip issues.  She says over the last couple of days the problem has become much worse.  She attempted to travel to [place] today with her daughter, and could not complete the trip because of increasing back pain and hip pain.  No reported loss of bowel or bladder function.  She occasionally has some numbness of the left  lower extremity, but this has been going on for quite some time.  She is not had any recent falls or injuries.  She denies the use of any IV drug use.  She does report a history of chronic back problems and fibromyalgia.  There is been no hot joints noted.  No fever.  No history of septic joints.  It is of note that the patient has been receiving physical therapy over the last couple of months.  She presents now for assistance with this issue.  The history is provided by the patient.  Hip Pain Pertinent negatives include no chest pain, no abdominal pain and no shortness of breath.  Back Pain Associated symptoms: numbness   Associated symptoms: no abdominal pain, no chest pain, no dysuria and no weakness     Past Medical History:  Diagnosis Date  . Anxiety   . Arthritis    possibly her right shoulder.  . Chronic back pain   . COPD (chronic obstructive pulmonary disease) ([facility])   . Fibromyalgia   . GERD (gastroesophageal reflux disease)   . Hypertension   . Sleep apnea    could not tolerate    Patient Active Problem List   Diagnosis Date Noted  . Microcytic anemia 10/25/2017  . Constipation 10/25/2017  . Hematochezia 07/31/2017  .  Dysphagia 07/31/2017  . ANEMIA-NOS 07/06/2006  . DEPRESSION 07/06/2006  . COMMON MIGRAINE 07/06/2006  . HYPERTENSION 07/06/2006  . GERD (gastroesophageal reflux disease) 07/06/2006  . IRRITABLE BOWEL SYNDROME 07/06/2006  . INFECTION, URINARY TRACT NOS 07/06/2006  . DYSFUNCTIONAL UTERINE BLEEDING 07/06/2006  . LOW BACK PAIN 07/06/2006    Past Surgical History:  Procedure Laterality Date  . ABDOMINAL HYSTERECTOMY    . CARPAL TUNNEL RELEASE Left   . COLONOSCOPY WITH PROPOFOL N/A 08/23/2017   Dr. Jena Gauss, grade II hemorrhoids. next TCS in 10 years.   . ESOPHAGOGASTRODUODENOSCOPY (EGD) WITH PROPOFOL N/A 08/23/2017   Dr. Jena Gauss: mild erosive reflux esophagitis s/p dilation due to h/o dysphagia.   Marland Kitchen FOOT SURGERY Right    screws from fracture  . GANGLION CYST EXCISION Right    foot  . KNEE SURGERY Right    arthroscopy  . MALONEY DILATION N/A 08/23/2017   Procedure: Elease Hashimoto DILATION;  Surgeon: Corbin Ade, MD;  [facility];  Service: Endoscopy;  Laterality: N/A;     OB History    Gravida  3   Para  2   Term  2   Preterm      AB  1   Living        SAB  1   TAB  Ectopic      Multiple      Live Births               Home Medications    Prior to Admission medications   Medication Sig Start Date End Date Taking? Authorizing Provider  albuterol (PROVENTIL HFA;VENTOLIN HFA) 108 (90 Base) MCG/ACT inhaler Inhale 1-2 puffs into the lungs every 6 (six) hours as needed for wheezing or shortness of breath.    [provider]  amLODipine (NORVASC) 10 MG tablet Take 10 mg by mouth daily.    [provider]  baclofen (LIORESAL) 10 MG tablet Take 10 mg by mouth 2 (two) times daily.    [provider]  budesonide-formoterol (SYMBICORT) 160-4.5 MCG/ACT inhaler Inhale 2 puffs into the lungs 2 (two) times daily.     [provider]  cyclobenzaprine (FLEXERIL) 10 MG tablet Take 10 mg by mouth 3 (three) times daily as needed for muscle  spasms.    [provider]  ferrous sulfate 325 (65 FE) MG tablet Take 325 mg by mouth daily.    [provider]  gabapentin (NEURONTIN) 100 MG capsule Take 100 mg by mouth 3 (three) times daily.    [provider]  hydrochlorothiazide (HYDRODIURIL) 25 MG tablet Take 12.5 mg by mouth daily.     [provider]  lidocaine (LIDODERM) 5 % Place 1 patch onto the skin daily. Place 1 patch over most prominent area of pain in the lower back daily. Remove & Discard patch within 12 hours. 01/03/19   Petrucelli, Pleas KochSamantha R, PA-C  linaclotide (LINZESS) 290 MCG CAPS capsule Take 1 capsule (290 mcg total) by mouth daily before breakfast. 10/30/17   Tiffany KocherLewis, Leslie S, PA-C  lubiprostone (AMITIZA) 24 MCG capsule Take 1 capsule (24 mcg total) by mouth 2 (two) times daily with a meal. 03/01/18   Tiffany KocherLewis, Leslie S, PA-C  lurasidone (LATUDA) 20 MG TABS tablet Take 20 mg by mouth every evening.    [provider]  meloxicam (MOBIC) 15 MG tablet TAKE 1 TABLET BY MOUTH ONCE DAILY WITH FOOD 04/25/18   [provider]  omeprazole (PRILOSEC) 20 MG capsule Take 1 capsule (20 mg total) by mouth 2 (two) times daily before a meal. 02/21/18   Tiffany KocherLewis, Leslie S, PA-C  predniSONE (STERAPRED UNI-PAK 21 TAB) 10 MG (21) TBPK tablet Take by mouth daily. 6, 5, 4, 3, 2, 1 take as written 01/03/19   Petrucelli, Samantha R, PA-C  venlafaxine XR (EFFEXOR-XR) 75 MG 24 hr capsule Take 75 mg by mouth daily with breakfast.     [provider]    Family History Family History  Problem Relation Age of Onset  . Hypertension Mother   . Diabetes Mother   . Arthritis Mother   . Hypercholesterolemia Mother   . Emphysema Father   . Cancer Other   . Diabetes Other   . Colon cancer Neg Hx   . Gastric cancer Neg Hx   . Esophageal cancer Neg Hx     Social History Social History   Tobacco Use  . Smoking status: Former Smoker    Packs/day: 1.00    Years: 20.00    Pack years: 20.00     Types: Cigarettes    Quit date: 08/20/2013    Years since quitting: 5.6  . Smokeless tobacco: Never Used  Substance Use Topics  . Alcohol use: Not Currently    Frequency: Never    Comment: in the past  . Drug  use: Yes    Types: Marijuana     Allergies   Doxepin, Lisinopril, and Pneumococcal vaccines   Review of Systems Review of Systems  Constitutional: Negative for activity change and appetite change.  HENT: Negative for congestion, ear discharge, ear pain, facial swelling, nosebleeds, rhinorrhea, sneezing and tinnitus.   Eyes: Negative for photophobia, pain and discharge.  Respiratory: Negative for cough, choking, shortness of breath and wheezing.   Cardiovascular: Negative for chest pain, palpitations and leg swelling.  Gastrointestinal: Negative for abdominal pain, blood in stool, constipation, diarrhea, nausea and vomiting.  Genitourinary: Negative for difficulty urinating, dysuria, flank pain, frequency and hematuria.  Musculoskeletal: Positive for arthralgias and back pain. Negative for gait problem, myalgias and neck pain.  Skin: Negative for color change, rash and wound.  Neurological: Positive for numbness. Negative for dizziness, seizures, syncope, facial asymmetry, speech difficulty and weakness.  Hematological: Negative for adenopathy. Does not bruise/bleed easily.  Psychiatric/Behavioral: Negative for agitation, confusion, hallucinations, self-injury and suicidal ideas. The patient is not nervous/anxious.      Physical Exam Updated Vital Signs BP 130/86 (BP Location: Right Arm)   Pulse 84   Temp 98.2 F (36.8 C) (Oral)   Resp 16   Ht 5\' 7"  (1.702 m)   Wt 102.1 kg   SpO2 97%   BMI 35.24 kg/m   Physical Exam Vitals signs and nursing note reviewed.  Constitutional:      Appearance: She is well-developed. She is not toxic-appearing.  HENT:     Head: Normocephalic.     Right Ear: Tympanic membrane and external ear normal.     Left Ear: Tympanic membrane  and external ear normal.  Eyes:     General: Lids are normal.     Pupils: Pupils are equal, round, and reactive to light.  Neck:     Musculoskeletal: Normal range of motion and neck supple.     Vascular: No carotid bruit.  Cardiovascular:     Rate and Rhythm: Normal rate and regular rhythm.     Pulses: Normal pulses.     Heart sounds: Normal heart sounds.  Pulmonary:     Effort: No respiratory distress.     Breath sounds: Normal breath sounds.  Abdominal:     General: Bowel sounds are normal.     Palpations: Abdomen is soft.     Tenderness: There is no abdominal tenderness. There is no guarding.  Musculoskeletal:     Left hip: She exhibits decreased range of motion and tenderness.     Lumbar back: She exhibits decreased range of motion and pain.       Back:  Lymphadenopathy:     Head:     Right side of head: No submandibular adenopathy.     Left side of head: No submandibular adenopathy.     Cervical: No cervical adenopathy.  Skin:    General: Skin is warm and dry.  Neurological:     Mental Status: She is alert and oriented to person, place, and time.     Cranial Nerves: No cranial nerve deficit.     Sensory: No sensory deficit.     Comments: There is mild decrease in sensation of light touch and pain of the left lower extremity. Motor strength is symmetrical of the upper and lower extremities.  Psychiatric:        Speech: Speech normal.      ED Treatments / Results  Labs (all labs ordered are listed, but only abnormal results are displayed) Labs  Reviewed - No data to display  EKG None  Radiology No results found.  Procedures Procedures (including critical care time)  Medications Ordered in ED Medications - No data to display   Initial Impression / Assessment and Plan / ED Course  I have reviewed the triage vital signs and the nursing notes.  Pertinent labs & imaging results that were available during my care of the patient were reviewed by me and  considered in my medical decision making (see chart for details).          Final Clinical Impressions(s) / ED Diagnoses MDM  Vital signs within normal limits.  Pulse oximetry is 97% on room air.  Within normal limits by my interpretation.  I have reviewed previous records and emergency department visits.  Patient has a history of fibromyalgia.  Recent MR of the lumbar spine shows spondylosis at the L4-L5 area.  There is facet facet arthropathy at L5 and S1.  There is right neuroforaminal stenosis at L5S1.  There is been no loss of bowel or bladder function.  There is been no numbness in the saddle area.  Patient has some mild numbness and tingling of the left lower extremity, however this is not a new finding for this patient.  Patient is currently receiving physical therapy, and receiving care from the [facility].  Patient treated in the emergency department with steroids, Neurontin, Percocet, and Robaxin.  I have asked the patient to follow-up with her physicians at the [facility] to discuss her increasing pain in spite of the physical therapy and to also, with a pain management plan.   Final diagnoses:  Bilateral low back pain with left-sided sciatica, unspecified chronicity  History of fibromyalgia    ED Discharge Orders    None       Ivery Quale, PA-C 04/09/19 1644    Loren Racer, MD 04/09/19 2302

## 2019-04-22 ENCOUNTER — Other Ambulatory Visit (HOSPITAL_COMMUNITY): Payer: Self-pay | Admitting: Family

## 2019-04-22 DIAGNOSIS — Z1231 Encounter for screening mammogram for malignant neoplasm of breast: Secondary | ICD-10-CM

## 2019-04-23 ENCOUNTER — Telehealth: Payer: Self-pay | Admitting: Internal Medicine

## 2019-04-23 ENCOUNTER — Ambulatory Visit: Payer: No Typology Code available for payment source | Admitting: Gastroenterology

## 2019-04-23 ENCOUNTER — Encounter: Payer: Self-pay | Admitting: Internal Medicine

## 2019-04-23 NOTE — Progress Notes (Deleted)
Primary Care Physician: [place]  Primary Gastroenterologist:    No chief complaint on file.   HPI: Vicki Dean is a 58 y.o. female here for follow-up of abdominal pain.  She has a history of microcytic anemia, heme positive stool which we were trying to work-up previously but her work-up was significantly delayed by her noncompliance.  We have requested labs prior to scheduling capsule endoscopy on several occasions.  It is now been over a year since they were originally requested.  Last seen in September 2019.  She has GERD, constipation, microcytic anemia. EGD and colonoscopy in March 2019 showed mild erosive reflux esophagitis status post dilation, grade 2 hemorrhoids, next colonoscopy planned for 10 years.    Difficult to manage constipation in the past.  Linzess 290 mcg daily was a failure.  Amitiza and Motegrity not covered.   Current Outpatient Medications  Medication Sig Dispense Refill  . albuterol (PROVENTIL HFA;VENTOLIN HFA) 108 (90 Base) MCG/ACT inhaler Inhale 1-2 puffs into the lungs every 6 (six) hours as needed for wheezing or shortness of breath.    Marland Kitchen amLODipine (NORVASC) 10 MG tablet Take 10 mg by mouth daily.    . baclofen (LIORESAL) 10 MG tablet Take 10 mg by mouth 2 (two) times daily.    . budesonide-formoterol (SYMBICORT) 160-4.5 MCG/ACT inhaler Inhale 2 puffs into the lungs 2 (two) times daily.     . cyclobenzaprine (FLEXERIL) 10 MG tablet Take 10 mg by mouth 3 (three) times daily as needed for muscle spasms.    . ferrous sulfate 325 (65 FE) MG tablet Take 325 mg by mouth daily.    Marland Kitchen gabapentin (NEURONTIN) 100 MG capsule Take 100 mg by mouth 3 (three) times daily.    . hydrochlorothiazide (HYDRODIURIL) 25 MG tablet Take 12.5 mg by mouth daily.     Marland Kitchen lidocaine (LIDODERM) 5 % Place 1 patch onto the skin daily. Place 1 patch over most prominent area of pain in the lower back daily. Remove & Discard patch within 12 hours. 30 patch 0  .  linaclotide (LINZESS) 290 MCG CAPS capsule Take 1 capsule (290 mcg total) by mouth daily before breakfast. 90 capsule 3  . lubiprostone (AMITIZA) 24 MCG capsule Take 1 capsule (24 mcg total) by mouth 2 (two) times daily with a meal. 90 capsule 3  . lurasidone (LATUDA) 20 MG TABS tablet Take 20 mg by mouth every evening.    . meloxicam (MOBIC) 15 MG tablet TAKE 1 TABLET BY MOUTH ONCE DAILY WITH FOOD    . omeprazole (PRILOSEC) 20 MG capsule Take 1 capsule (20 mg total) by mouth 2 (two) times daily before a meal. 180 capsule 3  . predniSONE (STERAPRED UNI-PAK 21 TAB) 10 MG (21) TBPK tablet Take by mouth daily. 6, 5, 4, 3, 2, 1 take as written 21 tablet 0  . venlafaxine XR (EFFEXOR-XR) 75 MG 24 hr capsule Take 75 mg by mouth daily with breakfast.      No current facility-administered medications for this visit.     Allergies as of 04/23/2019 - Review Complete 04/09/2019  Allergen Reaction Noted  . Doxepin  07/31/2017  . Lisinopril  06/28/2017  . Pneumococcal vaccines  07/31/2017    ROS:  General: Negative for anorexia, weight loss, fever, chills, fatigue, weakness. ENT: Negative for hoarseness, difficulty swallowing , nasal congestion. CV: Negative for chest pain, angina, palpitations, dyspnea on exertion, peripheral edema.  Respiratory: Negative for dyspnea at rest, dyspnea on  exertion, cough, sputum, wheezing.  GI: See history of present illness. GU:  Negative for dysuria, hematuria, urinary incontinence, urinary frequency, nocturnal urination.  Endo: Negative for unusual weight change.    Physical Examination:   There were no vitals taken for this visit.  General: Well-nourished, well-developed in no acute distress.  Eyes: No icterus. Mouth: Oropharyngeal mucosa moist and pink , no lesions erythema or exudate. Lungs: Clear to auscultation bilaterally.  Heart: Regular rate and rhythm, no murmurs rubs or gallops.  Abdomen: Bowel sounds are normal, nontender, nondistended, no  hepatosplenomegaly or masses, no abdominal bruits or hernia , no rebound or guarding.   Extremities: No lower extremity edema. No clubbing or deformities. Neuro: Alert and oriented x 4   Skin: Warm and dry, no jaundice.   Psych: Alert and cooperative, normal mood and affect.  Labs:  ***  Imaging Studies: No results found.

## 2019-04-23 NOTE — Telephone Encounter (Signed)
PATIENT WAS A NO SHOW AND LETTER SENT  °

## 2019-05-05 ENCOUNTER — Other Ambulatory Visit: Payer: Self-pay

## 2019-05-05 ENCOUNTER — Ambulatory Visit (INDEPENDENT_AMBULATORY_CARE_PROVIDER_SITE_OTHER): Payer: No Typology Code available for payment source | Admitting: Gastroenterology

## 2019-05-05 ENCOUNTER — Encounter: Payer: Self-pay | Admitting: Gastroenterology

## 2019-05-05 VITALS — BP 137/89 | HR 86 | Temp 96.8°F | Ht 67.0 in | Wt 227.8 lb

## 2019-05-05 DIAGNOSIS — R1030 Lower abdominal pain, unspecified: Secondary | ICD-10-CM

## 2019-05-05 DIAGNOSIS — K5900 Constipation, unspecified: Secondary | ICD-10-CM | POA: Diagnosis not present

## 2019-05-05 DIAGNOSIS — D509 Iron deficiency anemia, unspecified: Secondary | ICD-10-CM

## 2019-05-05 DIAGNOSIS — R195 Other fecal abnormalities: Secondary | ICD-10-CM

## 2019-05-05 DIAGNOSIS — K2100 Gastro-esophageal reflux disease with esophagitis, without bleeding: Secondary | ICD-10-CM

## 2019-05-05 NOTE — Patient Instructions (Signed)
1. Please have your labs done so we can decide next step in your care. 2. Please bring by a list of you current medications so we can make appropriate changes to better manage your reflux, constipation, and abdominal pain.

## 2019-05-05 NOTE — Progress Notes (Signed)
Primary Care Physician: [facility]  Primary Gastroenterologist:  Roetta Sessions, MD   Chief Complaint  Patient presents with  . Abdominal Pain    lower abd  . Constipation    HPI: Vicki Dean is a 58 y.o. female here for follow-up.  Work-up has been delayed due to her noncompliance.  She has a history of IDA, heme positive stool, constipation, GERD.  Her last visit here was September 2019.  She had a EGD and colonoscopy in March 2019 showed mild erosive reflux esophagitis status post dilation, grade 2 hemorrhoids, next colonoscopy planned for 10 years.  Patient presents today without a list of her medications. She is not sure what she is taking. Taking something else for constipation, starts with a "p". Given by [facility]. Some days stools are ok. At other times has to strain. No melena. Maybe little brbpr with straining.   Doesn't take omeprazole. Not sure the name of her heartburn medication. Has heartburn every day. Sleeps with cpap. C/o regurgitation especially at night.  Elevated head when sleeping. Taking PPI once daily.   Abdominal pain, intermittent, not daily. Located in center of abdomen. Sometimes severe cramps and she is bent over and can't walk. Worse with constipation.    LIST OF MEDICATIONS BELOW MAY BE INCORRECT. PATIENT TO  BRING LIST TO UPDATE.   Current Outpatient Medications  Medication Sig Dispense Refill  . albuterol (PROVENTIL HFA;VENTOLIN HFA) 108 (90 Base) MCG/ACT inhaler Inhale 1-2 puffs into the lungs every 6 (six) hours as needed for wheezing or shortness of breath.    Marland Kitchen amLODipine (NORVASC) 10 MG tablet Take 10 mg by mouth daily.    . baclofen (LIORESAL) 10 MG tablet Take 10 mg by mouth 2 (two) times daily.    . budesonide-formoterol (SYMBICORT) 160-4.5 MCG/ACT inhaler Inhale 2 puffs into the lungs 2 (two) times daily.     . cyclobenzaprine (FLEXERIL) 10 MG tablet Take 10 mg by mouth 3 (three) times daily as needed for muscle  spasms.    . ferrous sulfate 325 (65 FE) MG tablet Take 325 mg by mouth daily.    Marland Kitchen gabapentin (NEURONTIN) 100 MG capsule Take 100 mg by mouth 3 (three) times daily.    . hydrochlorothiazide (HYDRODIURIL) 25 MG tablet Take 12.5 mg by mouth daily.     Marland Kitchen lubiprostone (AMITIZA) 24 MCG capsule Take 1 capsule (24 mcg total) by mouth 2 (two) times daily with a meal. 90 capsule 3  . lurasidone (LATUDA) 20 MG TABS tablet Take 20 mg by mouth every evening.    . meloxicam (MOBIC) 15 MG tablet TAKE 1 TABLET BY MOUTH ONCE DAILY WITH FOOD    . omeprazole (PRILOSEC) 20 MG capsule Take 1 capsule (20 mg total) by mouth 2 (two) times daily before a meal. 180 capsule 3  . venlafaxine XR (EFFEXOR-XR) 75 MG 24 hr capsule Take 75 mg by mouth daily with breakfast.      No current facility-administered medications for this visit.     Allergies as of 05/05/2019 - Review Complete 05/05/2019  Allergen Reaction Noted  . Doxepin  07/31/2017  . Lisinopril  06/28/2017  . Pneumococcal vaccines  07/31/2017    ROS:  General: Negative for anorexia, weight loss, fever, chills, fatigue, weakness. ENT: Negative for hoarseness, difficulty swallowing , nasal congestion. CV: Negative for chest pain, angina, palpitations, dyspnea on exertion, peripheral edema.  Respiratory: Negative for dyspnea at rest, dyspnea on exertion, cough, sputum, wheezing.  GI: See history of present illness. GU:  Negative for dysuria, hematuria, urinary incontinence, urinary frequency, nocturnal urination.  Endo: Negative for unusual weight change.    Physical Examination:   BP 137/89   Pulse 86   Temp (!) 96.8 F (36 C) (Temporal)   Ht 5\' 7"  (1.702 m)   Wt 227 lb 12.8 oz (103.3 kg)   BMI 35.68 kg/m   General: Well-nourished, well-developed in no acute distress.  Eyes: No icterus. Mouth: Oropharyngeal mucosa moist and pink , no lesions erythema or exudate. Lungs: Clear to auscultation bilaterally.  Heart: Regular rate and rhythm, no  murmurs rubs or gallops.  Abdomen: Bowel sounds are normal, nontender, nondistended, no hepatosplenomegaly or masses, no abdominal bruits or hernia , no rebound or guarding.   Extremities: No lower extremity edema. No clubbing or deformities. Neuro: Alert and oriented x 4   Skin: Warm and dry, no jaundice.   Psych: Alert and cooperative, normal mood and affect.   Imaging Studies: No results found.

## 2019-05-06 NOTE — Assessment & Plan Note (Signed)
Likely related to poorly controlled constipation. Will make change in therapy once we receive updated medication list from patient.

## 2019-05-06 NOTE — Assessment & Plan Note (Signed)
Encouraged patient to follow through with obtaining labs so that we can complete her work up.

## 2019-05-06 NOTE — Assessment & Plan Note (Signed)
Poorly controlled. Unfortunately we are not sure what she is taking at this time. She will bring by medication list so we can make appropriate changes. Reinforced antireflux measures.

## 2019-05-06 NOTE — Assessment & Plan Note (Signed)
Poorly controlled. Once she brings by medication list we can make appropriate changes to therapy.

## 2019-05-21 ENCOUNTER — Other Ambulatory Visit: Payer: Self-pay

## 2019-05-21 ENCOUNTER — Telehealth: Payer: Self-pay | Admitting: Neurology

## 2019-05-21 ENCOUNTER — Ambulatory Visit (HOSPITAL_COMMUNITY)
Admission: RE | Admit: 2019-05-21 | Discharge: 2019-05-21 | Disposition: A | Discharge status: Home / Self Care | Payer: No Typology Code available for payment source | Source: Ambulatory Visit | Attending: Family | Admitting: Family

## 2019-05-21 DIAGNOSIS — Z1231 Encounter for screening mammogram for malignant neoplasm of breast: Secondary | ICD-10-CM | POA: Diagnosis not present

## 2019-05-21 DIAGNOSIS — R202 Paresthesia of skin: Secondary | ICD-10-CM

## 2019-05-21 NOTE — Telephone Encounter (Signed)
Patient called requesting to change her EMG for tomorrow to doing a study of her lower body extremities first instead of bilateral upper extremities as scheduled. Please advise patient.

## 2019-05-21 NOTE — Telephone Encounter (Signed)
Please advise 

## 2019-05-22 ENCOUNTER — Ambulatory Visit (INDEPENDENT_AMBULATORY_CARE_PROVIDER_SITE_OTHER): Payer: No Typology Code available for payment source | Admitting: Neurology

## 2019-05-22 ENCOUNTER — Other Ambulatory Visit: Payer: Self-pay

## 2019-05-22 DIAGNOSIS — M5412 Radiculopathy, cervical region: Secondary | ICD-10-CM

## 2019-05-22 DIAGNOSIS — R202 Paresthesia of skin: Secondary | ICD-10-CM | POA: Diagnosis not present

## 2019-05-22 NOTE — Telephone Encounter (Signed)
Patient aware test has to be specific to order.

## 2019-05-22 NOTE — Telephone Encounter (Signed)
Please inform patient that we only perform the study on what her provider has ordered.  Ordered rec'd from Jannet Mantis at the [facility] for bilateral arms.  She needs to discuss anything further with her provider.  We will only be performing arms today.

## 2019-05-22 NOTE — Procedures (Signed)
[facility]  [address]  [place], [place] [address] Tel: [phone] Fax:  [phone] Test Date:  05/22/2019  Patient: Vicki Dean DOB: 31-Aug-1960 Physician: Narda Amber, DO  Sex: Female Height: 5\' 7"  Ref Phys: Cynda Familia, NP  ID#: [id number] Temp: 33.0C Technician:    Patient Complaints: This is a 58 year old female with fibromyalgia referred for evaluation of bilateral arm pain.  NCV & EMG Findings: Extensive electrodiagnostic testing of the right upper extremity and additional studies of the left shows:  1. Bilateral median and ulnar sensory responses are within normal limits. 2. Bilateral median and ulnar motor responses are within normal limits. 3. Chronic motor axonal loss changes are seen affecting the left biceps and deltoid muscles, without accompanied active denervation.  These findings are not present in the right upper extremity.  Impression: 1. 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 2. There is no evidence of a carpal tunnel syndrome affecting the upper extremities.   ___________________________ Narda Amber, DO    Nerve Conduction Studies Anti Sensory Summary Table   Site NR Peak (ms) Norm Peak (ms) P-T Amp (V) Norm P-T Amp  Left Median Anti Sensory (2nd Digit)  33C  Wrist    3.2 <3.6 35.4 >15  Right Median Anti Sensory (2nd Digit)  33C  Wrist    3.3 <3.6 37.0 >15  Left Ulnar Anti Sensory (5th Digit)  33C  Wrist    3.1 <3.1 35.8 >10  Right Ulnar Anti Sensory (5th Digit)  33C  Wrist    3.0 <3.1 29.0 >10   Motor Summary Table   Site NR Onset (ms) Norm Onset (ms) O-P Amp (mV) Norm O-P Amp Site1 Site2 Delta-0 (ms) Dist (cm) Vel (m/s) Norm Vel (m/s)  Left Median Motor (Abd Poll Brev)  33C  Wrist    3.2 <4.0 9.0 >6 Elbow Wrist 5.2 31.0 60 >50  Elbow    8.4  8.5         Right Median Motor (Abd Poll Brev)  33C  Wrist    3.1 <4.0 8.7 >6 Elbow Wrist 5.7 31.0 54 >50  Elbow    8.8  8.4          Left Ulnar Motor (Abd Dig Minimi)  33C  Wrist    2.3 <3.1 9.3 >7 B Elbow Wrist 4.1 25.0 61 >50  B Elbow    6.4  8.0  A Elbow B Elbow 1.7 10.0 59 >50  A Elbow    8.1  7.8         Right Ulnar Motor (Abd Dig Minimi)  33C  Wrist    2.4 <3.1 7.9 >7 B Elbow Wrist 4.4 25.0 57 >50  B Elbow    6.8  7.5  A Elbow B Elbow 1.7 10.0 59 >50  A Elbow    8.5  7.4          EMG   Side Muscle Ins Act Fibs Psw Fasc Number Recrt Dur Dur. Amp Amp. Poly Poly. Comment  Right 1stDorInt Nml Nml Nml Nml Nml Nml Nml Nml Nml Nml Nml Nml N/A  Right PronatorTeres Nml Nml Nml Nml Nml Nml Nml Nml Nml Nml Nml Nml N/A  Right Biceps Nml Nml Nml Nml Nml Nml Nml Nml Nml Nml Nml Nml N/A  Right Triceps Nml Nml Nml Nml Nml Nml Nml Nml Nml Nml Nml Nml N/A  Right Deltoid Nml Nml Nml Nml Nml Nml Nml Nml Nml Nml Nml Nml  N/A  Left 1stDorInt Nml Nml Nml Nml Nml Nml Nml Nml Nml Nml Nml Nml N/A  Left PronatorTeres Nml Nml Nml Nml Nml Nml Nml Nml Nml Nml Nml Nml N/A  Left Biceps Nml Nml Nml Nml 1- Rapid Some 1+ Some  1+ Nml Nml N/A  Left Triceps Nml Nml Nml Nml Nml Nml Nml Nml Nml Nml Nml Nml N/A  Left Deltoid Nml Nml Nml Nml 1- Rapid Some 1+ Some  1+ Nml Nml N/A      Waveforms:

## 2019-06-20 IMAGING — DX DG TOE GREAT 2+V*R*
3 series · 3 of 3 positions shown · non-contrast
Comparison: None.

CLINICAL DATA: Crush injury. Struck great toe against a suitcase
today. Pain and bruising.

EXAM:
RIGHT GREAT TOE

[toe ap]
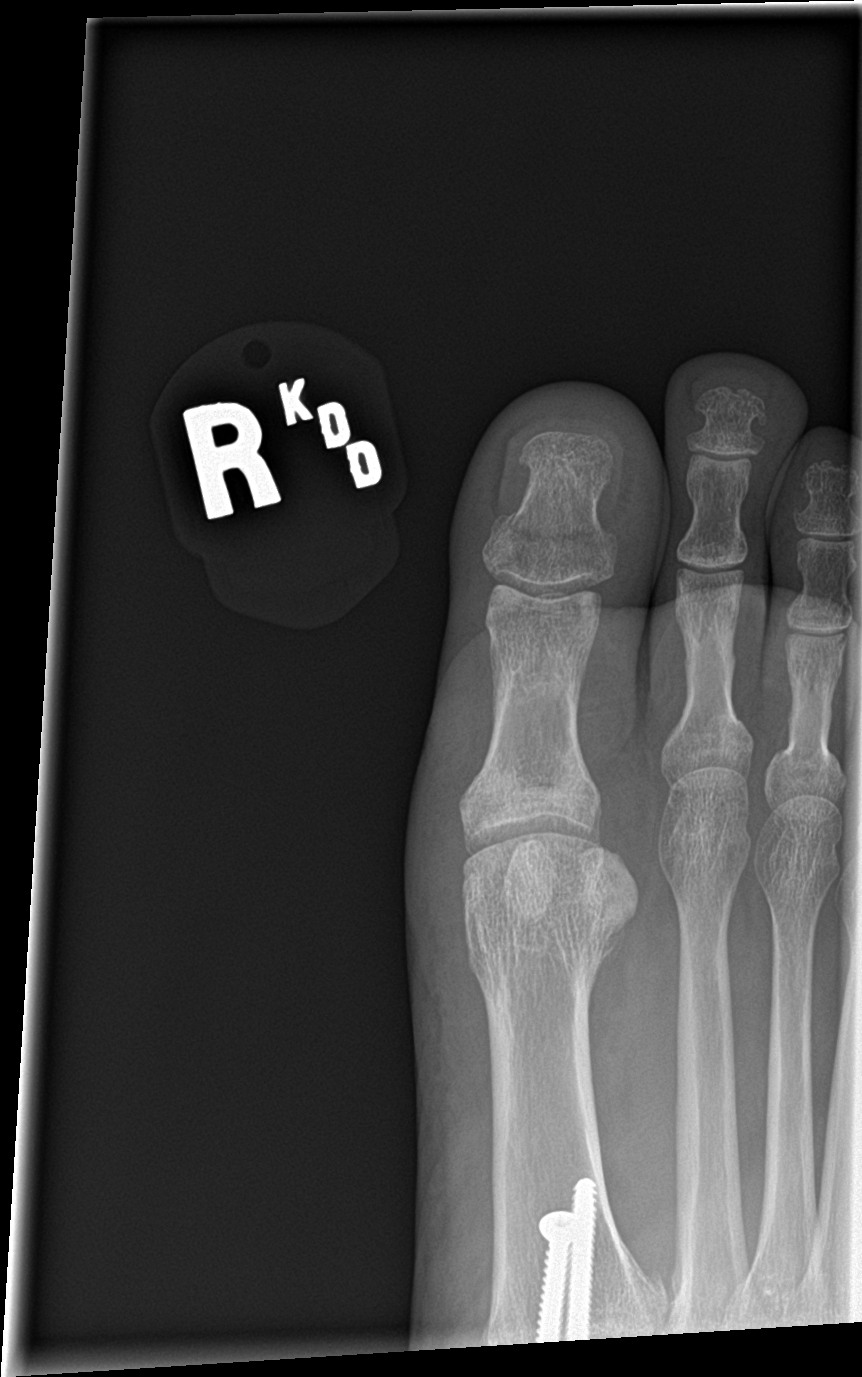

[toe obl]
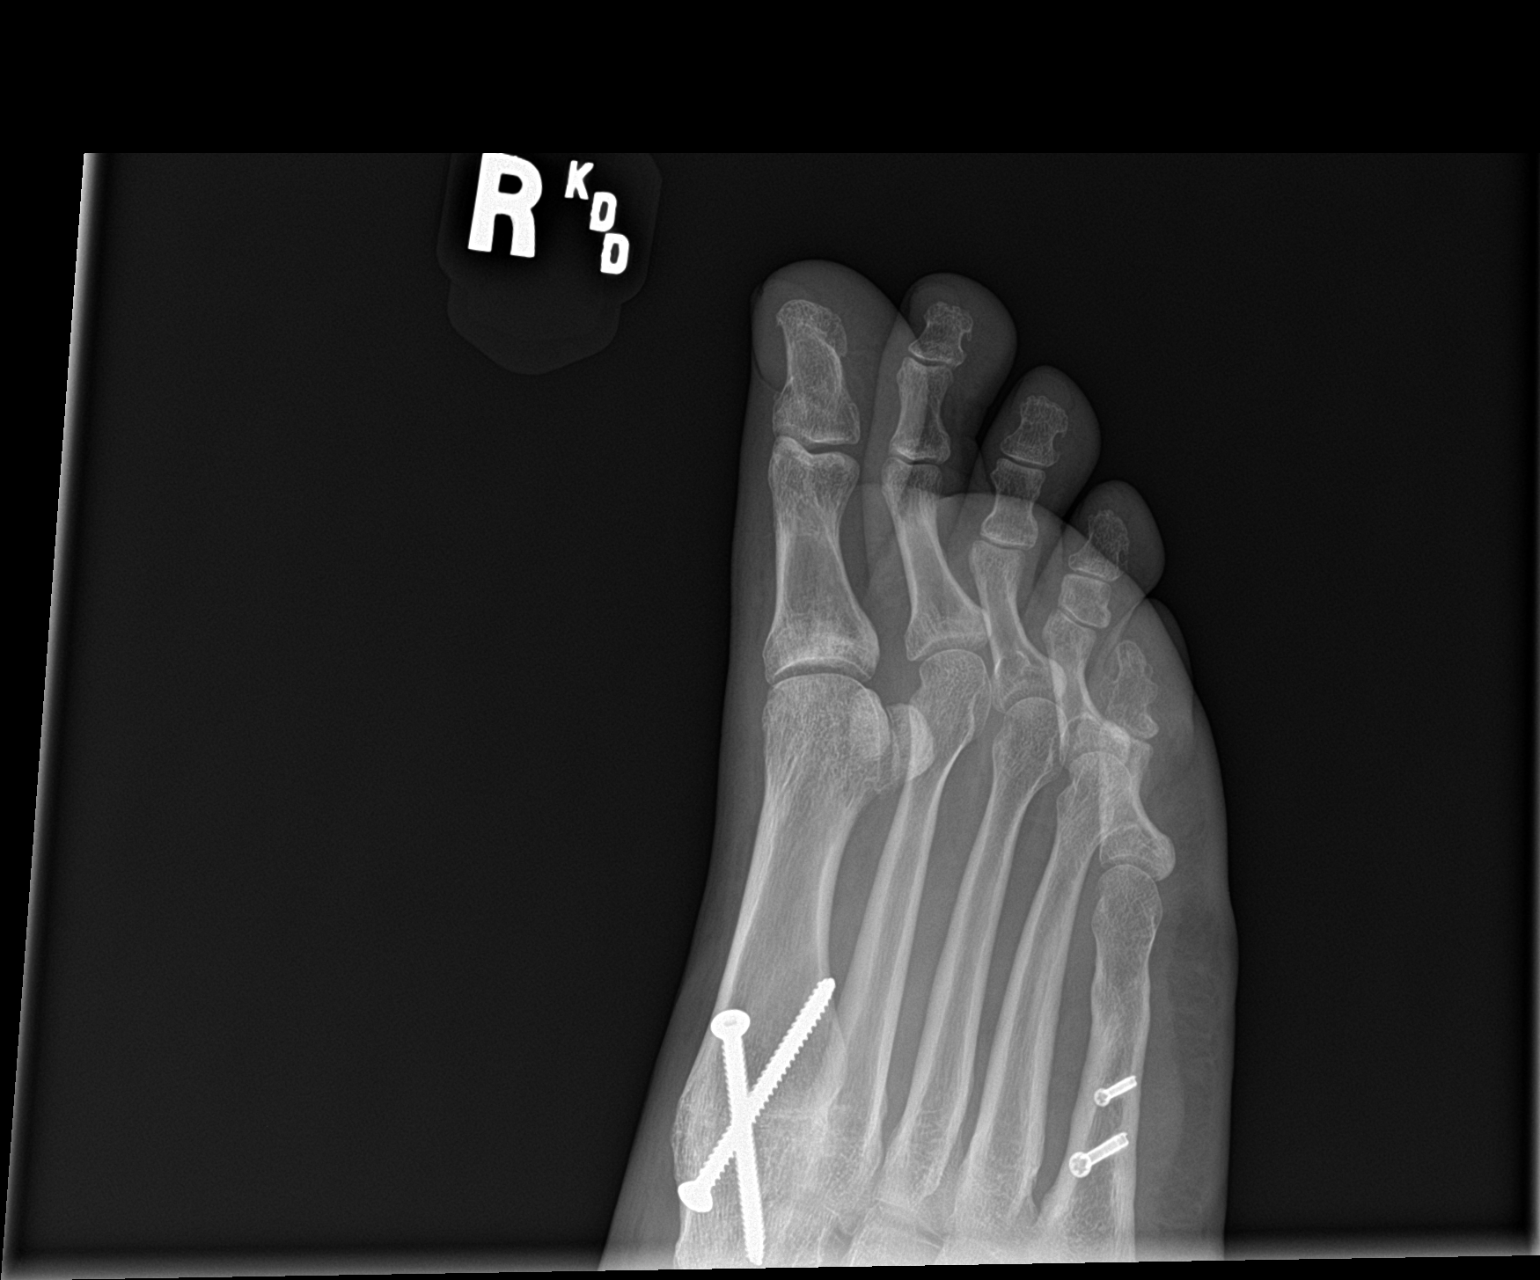

[toe lat]
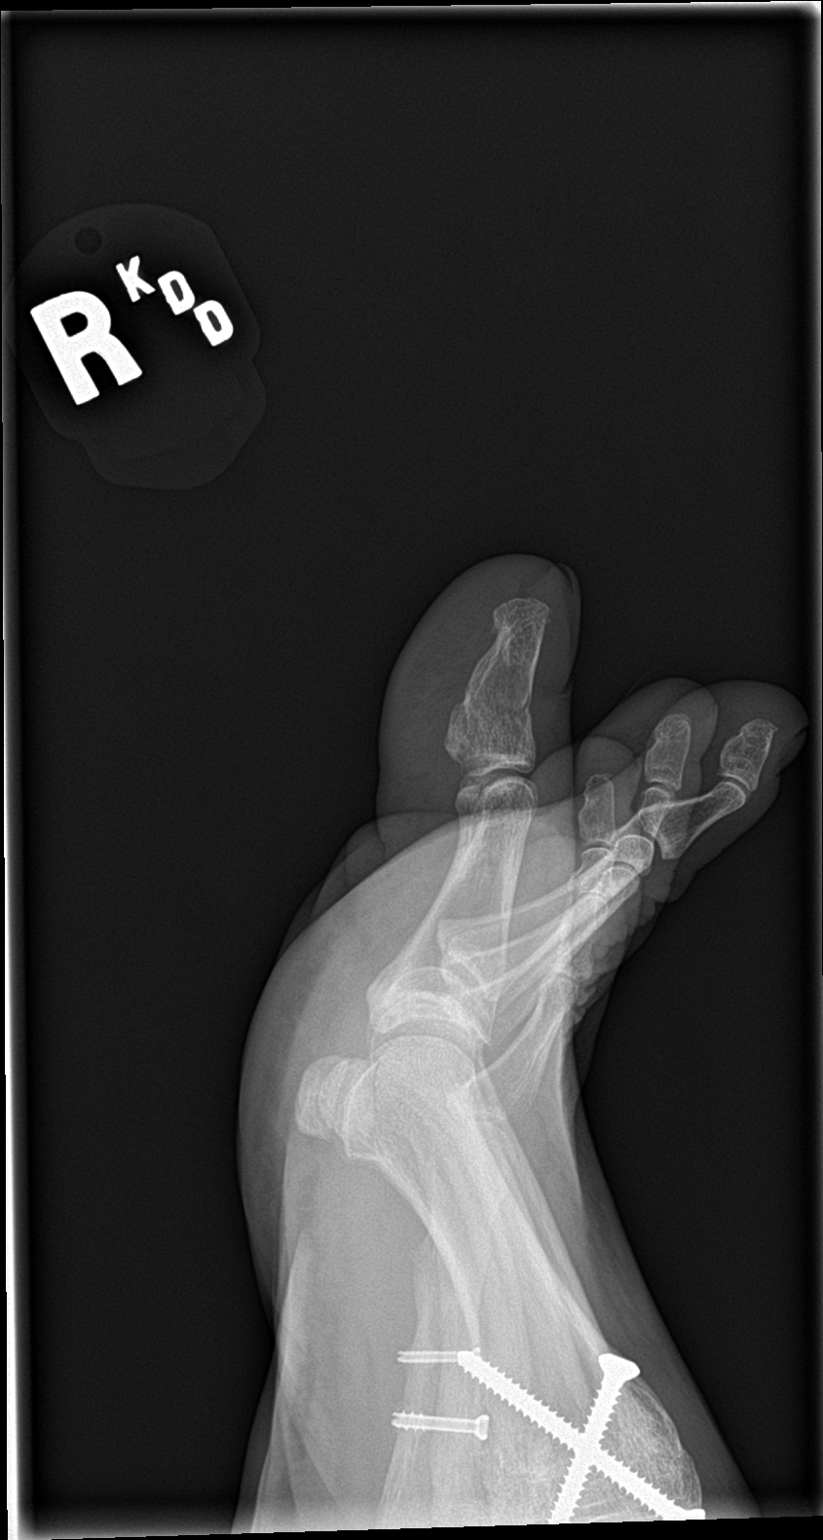

[3 of 3 positions shown; findings below may reference images not displayed]

FINDINGS: Transverse nondisplaced fracture of the great toe distal phalanx. No
intra-articular extension. No additional acute fracture. There is
soft tissue edema about the digit. Postsurgical change of the fifth
metatarsal and first tarsal metatarsal joint are partially included.
IMPRESSION: Acute transverse nondisplaced great toe distal phalanx fracture.

## 2019-07-14 ENCOUNTER — Ambulatory Visit: Payer: No Typology Code available for payment source | Attending: Internal Medicine

## 2019-07-14 ENCOUNTER — Other Ambulatory Visit: Payer: Self-pay

## 2019-07-14 DIAGNOSIS — Z20822 Contact with and (suspected) exposure to covid-19: Secondary | ICD-10-CM

## 2019-07-15 LAB — NOVEL CORONAVIRUS, NAA: SARS-CoV-2, NAA: DETECTED — AB

## 2019-07-16 ENCOUNTER — Telehealth: Payer: Self-pay | Admitting: Physician Assistant

## 2019-07-16 NOTE — Telephone Encounter (Signed)
Called to discuss with Dartha Lodge about Covid symptoms and the use of bamlanivimab, a monoclonal antibody infusion for those with mild to moderate Covid symptoms and at a high risk of hospitalization.     Pt does not qualify for infusion therapy as her symptoms first presented > 10 days prior to timing of infusion (she initially tested positive 07/02/19). Symptoms tier reviewed as well as criteria for ending isolation (she has already completed over 10 days of isolation and has remained symptom free). Preventative practices reviewed. Patient verbalized understanding   Rutherford Guys, PA - C

## 2019-08-12 ENCOUNTER — Emergency Department (HOSPITAL_COMMUNITY)
Admission: EM | Admit: 2019-08-12 | Discharge: 2019-08-12 | Disposition: A | Discharge status: Home / Self Care | Payer: No Typology Code available for payment source | Attending: Emergency Medicine | Admitting: Emergency Medicine

## 2019-08-12 ENCOUNTER — Encounter (HOSPITAL_COMMUNITY): Payer: Self-pay | Admitting: Emergency Medicine

## 2019-08-12 ENCOUNTER — Other Ambulatory Visit: Payer: Self-pay

## 2019-08-12 DIAGNOSIS — N898 Other specified noninflammatory disorders of vagina: Secondary | ICD-10-CM | POA: Insufficient documentation

## 2019-08-12 DIAGNOSIS — N390 Urinary tract infection, site not specified: Secondary | ICD-10-CM | POA: Diagnosis not present

## 2019-08-12 DIAGNOSIS — R1030 Lower abdominal pain, unspecified: Secondary | ICD-10-CM | POA: Diagnosis present

## 2019-08-12 DIAGNOSIS — R102 Pelvic and perineal pain: Secondary | ICD-10-CM | POA: Diagnosis not present

## 2019-08-12 LAB — CBC
HCT: 36.1 % (ref 36.0–46.0)
Hemoglobin: 10.7 g/dL — ABNORMAL LOW (ref 12.0–15.0)
MCH: 20.9 pg — ABNORMAL LOW (ref 26.0–34.0)
MCHC: 29.6 g/dL — ABNORMAL LOW (ref 30.0–36.0)
MCV: 70.5 fL — ABNORMAL LOW (ref 80.0–100.0)
Platelets: 249 10*3/uL (ref 150–400)
RBC: 5.12 MIL/uL — ABNORMAL HIGH (ref 3.87–5.11)
RDW: 16.9 % — ABNORMAL HIGH (ref 11.5–15.5)
WBC: 7.1 10*3/uL (ref 4.0–10.5)
nRBC: 0 % (ref 0.0–0.2)

## 2019-08-12 LAB — BASIC METABOLIC PANEL
Anion gap: 10 (ref 5–15)
BUN: 13 mg/dL (ref 6–20)
CO2: 25 mmol/L (ref 22–32)
Calcium: 9.2 mg/dL (ref 8.9–10.3)
Chloride: 106 mmol/L (ref 98–111)
Creatinine, Ser: 0.66 mg/dL (ref 0.44–1.00)
GFR calc Af Amer: >60 mL/min (ref >60)
GFR calc non Af Amer: >60 mL/min (ref >60)
Glucose, Bld: 107 mg/dL — ABNORMAL HIGH (ref 70–99)
Potassium: 3.1 mmol/L — ABNORMAL LOW (ref 3.5–5.1)
Sodium: 141 mmol/L (ref 135–145)

## 2019-08-12 LAB — URINALYSIS, ROUTINE W REFLEX MICROSCOPIC
Bilirubin Urine: NEGATIVE
Glucose, UA: NEGATIVE mg/dL
Ketones, ur: NEGATIVE mg/dL
Nitrite: NEGATIVE
Protein, ur: 100 mg/dL — AB
RBC / HPF: >50 RBC/hpf — ABNORMAL HIGH (ref 0–5)
Specific Gravity, Urine: 1.021 (ref 1.005–1.030)
WBC, UA: >50 WBC/hpf — ABNORMAL HIGH (ref 0–5)
pH: 6 (ref 5.0–8.0)

## 2019-08-12 LAB — WET PREP, GENITAL
Clue Cells Wet Prep HPF POC: NONE SEEN
Sperm: NONE SEEN
Trich, Wet Prep: NONE SEEN
Yeast Wet Prep HPF POC: NONE SEEN

## 2019-08-12 MED ORDER — CEPHALEXIN 500 MG PO CAPS
500.0000 mg | ORAL_CAPSULE | Freq: Once | ORAL | Status: AC
  Administered 2019-08-12: 500 mg via ORAL
  Filled 2019-08-12: qty 1

## 2019-08-12 MED ORDER — PHENAZOPYRIDINE HCL 200 MG PO TABS: 200.0000 mg | ORAL_TABLET | Freq: Three times a day (TID) | ORAL | 0 refills | Status: DC

## 2019-08-12 MED ORDER — PHENAZOPYRIDINE HCL 100 MG PO TABS
200.0000 mg | ORAL_TABLET | Freq: Once | ORAL | Status: AC
  Administered 2019-08-12: 200 mg via ORAL
  Filled 2019-08-12: qty 2

## 2019-08-12 MED ORDER — CEPHALEXIN 500 MG PO CAPS: 500.0000 mg | ORAL_CAPSULE | Freq: Four times a day (QID) | ORAL | 0 refills | Status: DC

## 2019-08-12 NOTE — Discharge Instructions (Addendum)
You were seen in the emergency department for some lower abdominal pain and pain with urination.  Your urinalysis looks like a possible urinary tract infection.  Your pelvic exam did not show any signs of injury and we sent off testing for gonorrhea and chlamydia.  We will contact you if you need any further antibiotics.  We are sending you a prescription for antibiotics to treat your urinary tract infection.  Drink plenty of fluids.  Follow-up with your doctor and return to the emergency department for any worsening symptoms.

## 2019-08-12 NOTE — ED Provider Notes (Signed)
[facility] EMERGENCY DEPARTMENT Provider Note   CSN: [id number] Arrival date & time: 08/12/19  1334     History Chief Complaint  Patient presents with  . Abdominal Pain    Vicki Dean is a 59 y.o. female.  She is complaining of lower abdominal pain vaginal discharge dysuria hematuria that started last evening.  She associates it with using a sex toy.  She is sexually active but has not been with anybody recently.  No prior history of STDs.  Infrequent UTIs.  No fevers chills nausea vomiting.  The history is provided by the patient.  Female GU Problem This is a new problem. The current episode started yesterday. The problem occurs hourly. The problem has not changed since onset.Associated symptoms include abdominal pain. Pertinent negatives include no chest pain, no headaches and no shortness of breath. Exacerbated by: urinating. Nothing relieves the symptoms. She has tried nothing for the symptoms. The treatment provided no relief.  Abdominal Pain Pain location:  Suprapubic Pain quality: stabbing   Pain radiates to:  Does not radiate Pain severity:  Moderate Onset quality:  Gradual Duration:  24 hours Progression:  Unchanged Chronicity:  New Context: recent sexual activity   Relieved by:  None tried Worsened by:  Urination Ineffective treatments:  None tried Associated symptoms: dysuria and hematuria   Associated symptoms: no chest pain, no fever, no nausea, no shortness of breath, no sore throat and no vomiting        Past Medical History:  Diagnosis Date  . Anxiety   . Arthritis    possibly her right shoulder.  . Chronic back pain   . COPD (chronic obstructive pulmonary disease) (HCC)   . Fibromyalgia   . GERD (gastroesophageal reflux disease)   . Hypertension   . Sleep apnea    could not tolerate    Patient Active Problem List   Diagnosis Date Noted  . Lower abdominal pain 05/05/2019  . Microcytic anemia 10/25/2017  . Constipation 10/25/2017  .  Hematochezia 07/31/2017  . Dysphagia 07/31/2017  . ANEMIA-NOS 07/06/2006  . DEPRESSION 07/06/2006  . COMMON MIGRAINE 07/06/2006  . HYPERTENSION 07/06/2006  . GERD (gastroesophageal reflux disease) 07/06/2006  . IRRITABLE BOWEL SYNDROME 07/06/2006  . INFECTION, URINARY TRACT NOS 07/06/2006  . DYSFUNCTIONAL UTERINE BLEEDING 07/06/2006  . LOW BACK PAIN 07/06/2006    Past Surgical History:  Procedure Laterality Date  . ABDOMINAL HYSTERECTOMY    . CARPAL TUNNEL RELEASE Left   . COLONOSCOPY WITH PROPOFOL N/A 08/23/2017   Dr. Jena Gauss, grade II hemorrhoids. next TCS in 10 years.   . ESOPHAGOGASTRODUODENOSCOPY (EGD) WITH PROPOFOL N/A 08/23/2017   Dr. Jena Gauss: mild erosive reflux esophagitis s/p dilation due to h/o dysphagia.   Marland Kitchen FOOT SURGERY Right    screws from fracture  . GANGLION CYST EXCISION Right    foot  . KNEE SURGERY Right    arthroscopy  . MALONEY DILATION N/A 08/23/2017   Procedure: Elease Hashimoto DILATION;  Surgeon: Corbin Ade, MD;  Location: [facility];  Service: Endoscopy;  Laterality: N/A;     OB History    Gravida  3   Para  2   Term  2   Preterm      AB  1   Living        SAB  1   TAB      Ectopic      Multiple      Live Births  Family History  Problem Relation Age of Onset  . Hypertension Mother   . Diabetes Mother   . Arthritis Mother   . Hypercholesterolemia Mother   . Emphysema Father   . Cancer Other   . Diabetes Other   . Colon cancer Neg Hx   . Gastric cancer Neg Hx   . Esophageal cancer Neg Hx     Social History   Tobacco Use  . Smoking status: Former Smoker    Packs/day: 1.00    Years: 20.00    Pack years: 20.00    Types: Cigarettes    Quit date: 08/20/2013    Years since quitting: 5.9  . Smokeless tobacco: Never Used  Substance Use Topics  . Alcohol use: Not Currently    Comment: in the past  . Drug use: Not Currently    Types: Marijuana    Comment: denied 05/05/19    Home Medications Prior to Admission  medications   Medication Sig Start Date End Date Taking? Authorizing Provider  albuterol (PROVENTIL HFA;VENTOLIN HFA) 108 (90 Base) MCG/ACT inhaler Inhale 1-2 puffs into the lungs every 6 (six) hours as needed for wheezing or shortness of breath.    [provider]  amLODipine (NORVASC) 10 MG tablet Take 10 mg by mouth daily.    [provider]  baclofen (LIORESAL) 10 MG tablet Take 10 mg by mouth 2 (two) times daily.    [provider]  budesonide-formoterol (SYMBICORT) 160-4.5 MCG/ACT inhaler Inhale 2 puffs into the lungs 2 (two) times daily.     [provider]  cyclobenzaprine (FLEXERIL) 10 MG tablet Take 10 mg by mouth 3 (three) times daily as needed for muscle spasms.    [provider]  ferrous sulfate 325 (65 FE) MG tablet Take 325 mg by mouth daily.    [provider]  gabapentin (NEURONTIN) 100 MG capsule Take 100 mg by mouth 3 (three) times daily.    [provider]  hydrochlorothiazide (HYDRODIURIL) 25 MG tablet Take 12.5 mg by mouth daily.     [provider]  lurasidone (LATUDA) 20 MG TABS tablet Take 20 mg by mouth every evening.    [provider]  meloxicam (MOBIC) 15 MG tablet TAKE 1 TABLET BY MOUTH ONCE DAILY WITH FOOD 04/25/18   [provider]  venlafaxine XR (EFFEXOR-XR) 75 MG 24 hr capsule Take 75 mg by mouth daily with breakfast.     [provider]    Allergies    Doxepin, Lisinopril, Omeprazole, and Pneumococcal vaccines  Review of Systems   Review of Systems  Constitutional: Negative for fever.  HENT: Negative for sore throat.   Eyes: Negative for visual disturbance.  Respiratory: Negative for shortness of breath.   Cardiovascular: Negative for chest pain.  Gastrointestinal: Positive for abdominal pain. Negative for nausea and vomiting.  Genitourinary: Positive for dysuria, frequency, hematuria and pelvic pain.  Musculoskeletal: Negative for back pain.  Skin:  Negative for rash.  Neurological: Negative for headaches.    Physical Exam Updated Vital Signs BP (!) 145/86 (BP Location: Right Arm)   Pulse 86   Temp 98.6 F (37 C) (Oral)   Resp 18   Ht 5\' 7"  (1.702 m)   Wt 106.6 kg   SpO2 96%   BMI 36.81 kg/m   Physical Exam Vitals and nursing note reviewed. Exam conducted with a chaperone present.  Constitutional:      General: She is not in acute distress.    Appearance: She is  well-developed.  HENT:     Head: Normocephalic and atraumatic.  Eyes:     Conjunctiva/sclera: Conjunctivae normal.  Cardiovascular:     Rate and Rhythm: Normal rate and regular rhythm.     Heart sounds: No murmur.  Pulmonary:     Effort: Pulmonary effort is normal. No respiratory distress.     Breath sounds: Normal breath sounds.  Abdominal:     Palpations: Abdomen is soft.     Tenderness: There is abdominal tenderness in the suprapubic area.  Genitourinary:    Vagina: No signs of injury. Vaginal discharge (thin white) present. No erythema, tenderness or bleeding.     Uterus: Normal.      Adnexa:        Right: No mass.         Left: No mass.    Musculoskeletal:     Cervical back: Neck supple.  Skin:    General: Skin is warm and dry.     Capillary Refill: Capillary refill takes less than 2 seconds.  Neurological:     General: No focal deficit present.     Mental Status: She is alert.     ED Results / Procedures / Treatments   Labs (all labs ordered are listed, but only abnormal results are displayed) Labs Reviewed  WET PREP, GENITAL - Abnormal; Notable for the following components:      Result Value   WBC, Wet Prep HPF POC MODERATE (*)    All other components within normal limits  URINALYSIS, ROUTINE W REFLEX MICROSCOPIC - Abnormal; Notable for the following components:   APPearance CLOUDY (*)    Hgb urine dipstick LARGE (*)    Protein, ur 100 (*)    Leukocytes,Ua LARGE (*)    RBC / HPF >50 (*)    WBC, UA >50 (*)    Bacteria, UA RARE (*)     All other components within normal limits  BASIC METABOLIC PANEL - Abnormal; Notable for the following components:   Potassium 3.1 (*)    Glucose, Bld 107 (*)    All other components within normal limits  CBC - Abnormal; Notable for the following components:   RBC 5.12 (*)    Hemoglobin 10.7 (*)    MCV 70.5 (*)    MCH 20.9 (*)    MCHC 29.6 (*)    RDW 16.9 (*)    All other components within normal limits  URINE CULTURE  GC/CHLAMYDIA PROBE AMP ([place]) NOT AT [facility]    EKG None  Radiology No results found.  Procedures Procedures (including critical care time)  Medications Ordered in ED Medications  cephALEXin (KEFLEX) capsule 500 mg (500 mg Oral Given 08/12/19 1659)  phenazopyridine (PYRIDIUM) tablet 200 mg (200 mg Oral Given 08/12/19 1659)    ED Course  I have reviewed the triage vital signs and the nursing notes.  Pertinent labs & imaging results that were available during my care of the patient were reviewed by me and considered in my medical decision making (see chart for details).  Clinical Course as of Aug 11 1652  Tue Aug 12, 2019  1627 Differential includes UTI, vaginal irritation, STD, vaginal laceration   [MB]    Clinical Course User Index [MB] Terrilee Files, MD   MDM Rules/Calculators/A&P                      Final Clinical Impression(s) / ED Diagnoses Final diagnoses:  Lower urinary tract infectious disease  Pelvic  pain in female    Rx / DC Orders ED Discharge Orders         Ordered    cephALEXin (KEFLEX) 500 MG capsule  4 times daily     08/12/19 1705    phenazopyridine (PYRIDIUM) 200 MG tablet  3 times daily     08/12/19 1705           Terrilee Files, MD 08/13/19 1023

## 2019-08-12 NOTE — ED Triage Notes (Signed)
Patient complains of lower abdominal pain that began today. Patient complains of burning with urination and increased frequency.

## 2019-08-14 LAB — URINE CULTURE: Culture: >=100000 — AB

## 2019-08-14 LAB — GC/CHLAMYDIA PROBE AMP (~~LOC~~) NOT AT ARMC
Chlamydia: NEGATIVE
Neisseria Gonorrhea: NEGATIVE

## 2019-08-15 ENCOUNTER — Telehealth: Payer: Self-pay | Admitting: *Deleted

## 2019-08-15 NOTE — Telephone Encounter (Signed)
Post ED Visit - Positive Culture Follow-up  Culture report reviewed by antimicrobial stewardship pharmacist: [facility] []  , Pharm.D. []  Enzo Bi, Pharm.D., BCPS AQ-ID []  , Pharm.D., BCPS []  Celedonio Miyamoto, Pharm.D., BCPS []  [place], Garvin Fila.D., BCPS, AAHIVP []  , Pharm.D., BCPS, AAHIVP []  Georgina Pillion, PharmD, BCPS []  , PharmD, BCPS []  [place], PharmD, BCPS []  [place], PharmD []  , PharmD, BCPS []  Estella Husk, PharmD , PharmD  [facility] []  , PharmD []  Phillips Climes, PharmD []  , PharmD []  Agapito Games, Rph []  ) Verlan Friends, PharmD []  , PharmD []  Mervyn Gay, PharmD []  , PharmD []  Vinnie Level, PharmD []  Gerrit Halls, PharmD []  Wonda Olds, PharmD []  , PharmD []  Len Childs, PharmD   Positive urine culture Treated with Cephalexin, organism sensitive to the same and no further patient follow-up is required at this time.  [facility] 08/15/2019, 10:33 AM

## 2019-10-31 LAB — CBC WITH DIFFERENTIAL/PLATELET
Absolute Monocytes: 409 cells/uL (ref 200–950)
Basophils Absolute: 28 cells/uL (ref 0–200)
Basophils Relative: 0.6 %
Eosinophils Absolute: 147 cells/uL (ref 15–500)
Eosinophils Relative: 3.2 %
HCT: 34.9 % — ABNORMAL LOW (ref 35.0–45.0)
Hemoglobin: 11.3 g/dL — ABNORMAL LOW (ref 11.7–15.5)
Lymphs Abs: 1601 cells/uL (ref 850–3900)
MCH: 21.2 pg — ABNORMAL LOW (ref 27.0–33.0)
MCHC: 32.4 g/dL (ref 32.0–36.0)
MCV: 65.4 fL — ABNORMAL LOW (ref 80.0–100.0)
MPV: 10.1 fL (ref 7.5–12.5)
Monocytes Relative: 8.9 %
Neutro Abs: 2415 cells/uL (ref 1500–7800)
Neutrophils Relative %: 52.5 %
Platelets: 238 10*3/uL (ref 140–400)
RBC: 5.34 10*6/uL — ABNORMAL HIGH (ref 3.80–5.10)
RDW: 16.3 % — ABNORMAL HIGH (ref 11.0–15.0)
Total Lymphocyte: 34.8 %
WBC: 4.6 10*3/uL (ref 3.8–10.8)

## 2019-10-31 LAB — IRON,TIBC AND FERRITIN PANEL
%SAT: 17 % (calc) (ref 16–45)
Ferritin: 99 ng/mL (ref 16–232)
Iron: 56 ug/dL (ref 45–160)
TIBC: 332 mcg/dL (calc) (ref 250–450)

## 2020-03-08 ENCOUNTER — Telehealth: Payer: Self-pay | Admitting: Internal Medicine

## 2020-03-08 NOTE — Telephone Encounter (Signed)
Pt brought a release of records for [place] to forward her records to the [facility]. I have forwarded her records and release sent to be scanned

## 2020-09-02 ENCOUNTER — Encounter: Payer: Self-pay | Admitting: Emergency Medicine

## 2020-09-02 ENCOUNTER — Ambulatory Visit
Admission: EM | Admit: 2020-09-02 | Discharge: 2020-09-02 | Disposition: A | Discharge status: Home / Self Care | Payer: No Typology Code available for payment source | Attending: Family Medicine | Admitting: Family Medicine

## 2020-09-02 ENCOUNTER — Other Ambulatory Visit: Payer: Self-pay

## 2020-09-02 DIAGNOSIS — M67441 Ganglion, right hand: Secondary | ICD-10-CM

## 2020-09-02 NOTE — ED Provider Notes (Signed)
[facility] CARE CENTER   [id number] 09/02/20 Arrival Time: 7829  ASSESSMENT & PLAN:  1. Digital mucous cyst of right hand     No indication for plain imaging at this time. No signs of infection.  Recommend:  Follow-up Information    Schedule an appointment as soon as possible for a visit  with Vickki Hearing, MD.   Specialties: Orthopedic Surgery, Radiology Contact information: [address] [place] [address] [phone]               Reviewed expectations re: course of current medical issues. Questions answered. Outlined signs and symptoms indicating need for more acute intervention. Patient verbalized understanding. After Visit Summary given.  SUBJECTIVE: History from: patient. Vicki Dean is a 60 y.o. female who reports "a knot" between R 2 and 3rd fingers; approx one month; no injury to area; "getting larger"; mild discomfort at times. No h/o similar. No OTC tx. Occasional slightly opaque drainage. No bleeding. Does not limit daily activities.  Past Surgical History:  Procedure Laterality Date  . ABDOMINAL HYSTERECTOMY    . CARPAL TUNNEL RELEASE Left   . COLONOSCOPY WITH PROPOFOL N/A 08/23/2017   Dr. Jena Gauss, grade II hemorrhoids. next TCS in 10 years.   . ESOPHAGOGASTRODUODENOSCOPY (EGD) WITH PROPOFOL N/A 08/23/2017   Dr. Jena Gauss: mild erosive reflux esophagitis s/p dilation due to h/o dysphagia.   Marland Kitchen FOOT SURGERY Right    screws from fracture  . GANGLION CYST EXCISION Right    foot  . KNEE SURGERY Right    arthroscopy  . MALONEY DILATION N/A 08/23/2017   Procedure: Elease Hashimoto DILATION;  Surgeon: Corbin Ade, MD;  Location: [facility];  Service: Endoscopy;  Laterality: N/A;      OBJECTIVE:  Vitals:   09/02/20 0830  BP: 131/80  Pulse: 62  Resp: 18  Temp: 98.7 F (37.1 C)  TempSrc: Oral  SpO2: 95%    General appearance: alert; no distress HEENT: [place]; AT Neck: supple with FROM Resp: unlabored respirations Extremities: . Apparent  cyst/mass of webbing between R 2/3 fingers; is tender; no drainage or bleeding Psychological: alert and cooperative; normal mood and affect    Allergies  Allergen Reactions  . Doxepin     Pt unsure of reaction  . Lisinopril     cough  . Omeprazole     Doesn't agree with her  . Pneumococcal Vaccines     Arm swelling    Past Medical History:  Diagnosis Date  . Anxiety   . Arthritis    possibly her right shoulder.  . Chronic back pain   . COPD (chronic obstructive pulmonary disease) (HCC)   . Fibromyalgia   . GERD (gastroesophageal reflux disease)   . Hypertension   . Sleep apnea    could not tolerate   Social History   Socioeconomic History  . Marital status: Single    Spouse name: Not on file  . Number of children: Not on file  . Years of education: Not on file  . Highest education level: Not on file  Occupational History  . Not on file  Tobacco Use  . Smoking status: Former Smoker    Packs/day: 1.00    Years: 20.00    Pack years: 20.00    Types: Cigarettes    Quit date: 08/20/2013    Years since quitting: 7.0  . Smokeless tobacco: Never Used  Vaping Use  . Vaping Use: Never used  Substance and Sexual Activity  . Alcohol use:  Not Currently    Comment: in the past  . Drug use: Not Currently    Types: Marijuana    Comment: denied 05/05/19  . Sexual activity: Yes    Birth control/protection: Surgical  Other Topics Concern  . Not on file  Social History Narrative  . Not on file   Social Determinants of Health   Financial Resource Strain: Not on file  Food Insecurity: Not on file  Transportation Needs: Not on file  Physical Activity: Not on file  Stress: Not on file  Social Connections: Not on file   Family History  Problem Relation Age of Onset  . Hypertension Mother   . Diabetes Mother   . Arthritis Mother   . Hypercholesterolemia Mother   . Emphysema Father   . Cancer Other   . Diabetes Other   . Colon cancer Neg Hx   . Gastric cancer Neg  Hx   . Esophageal cancer Neg Hx    Past Surgical History:  Procedure Laterality Date  . ABDOMINAL HYSTERECTOMY    . CARPAL TUNNEL RELEASE Left   . COLONOSCOPY WITH PROPOFOL N/A 08/23/2017   Dr. Jena Gauss, grade II hemorrhoids. next TCS in 10 years.   . ESOPHAGOGASTRODUODENOSCOPY (EGD) WITH PROPOFOL N/A 08/23/2017   Dr. Jena Gauss: mild erosive reflux esophagitis s/p dilation due to h/o dysphagia.   Marland Kitchen FOOT SURGERY Right    screws from fracture  . GANGLION CYST EXCISION Right    foot  . KNEE SURGERY Right    arthroscopy  . MALONEY DILATION N/A 08/23/2017   Procedure: Elease Hashimoto DILATION;  Surgeon: Corbin Ade, MD;  [facility];  Service: Endoscopy;  Laterality: N/AMardella Layman, MD 09/02/20 [phone]

## 2020-09-02 NOTE — ED Triage Notes (Addendum)
Pt has knot in between index finger and middle finger on the RT hand x 1 month.  Area is painful at times. Reports seeing some drainage from area on occasion.

## 2020-09-04 ENCOUNTER — Emergency Department (HOSPITAL_COMMUNITY)
Admission: EM | Admit: 2020-09-04 | Discharge: 2020-09-04 | Disposition: A | Discharge status: Home / Self Care | Payer: No Typology Code available for payment source | Attending: Emergency Medicine | Admitting: Emergency Medicine

## 2020-09-04 ENCOUNTER — Other Ambulatory Visit: Payer: Self-pay

## 2020-09-04 ENCOUNTER — Emergency Department (HOSPITAL_COMMUNITY): Payer: No Typology Code available for payment source

## 2020-09-04 ENCOUNTER — Encounter (HOSPITAL_COMMUNITY): Payer: Self-pay | Admitting: Emergency Medicine

## 2020-09-04 DIAGNOSIS — Z79899 Other long term (current) drug therapy: Secondary | ICD-10-CM | POA: Diagnosis not present

## 2020-09-04 DIAGNOSIS — J449 Chronic obstructive pulmonary disease, unspecified: Secondary | ICD-10-CM | POA: Insufficient documentation

## 2020-09-04 DIAGNOSIS — M542 Cervicalgia: Secondary | ICD-10-CM | POA: Diagnosis not present

## 2020-09-04 DIAGNOSIS — R079 Chest pain, unspecified: Secondary | ICD-10-CM | POA: Diagnosis not present

## 2020-09-04 DIAGNOSIS — M545 Low back pain, unspecified: Secondary | ICD-10-CM | POA: Diagnosis not present

## 2020-09-04 DIAGNOSIS — M546 Pain in thoracic spine: Secondary | ICD-10-CM | POA: Diagnosis not present

## 2020-09-04 DIAGNOSIS — R911 Solitary pulmonary nodule: Secondary | ICD-10-CM

## 2020-09-04 DIAGNOSIS — T07XXXA Unspecified multiple injuries, initial encounter: Secondary | ICD-10-CM

## 2020-09-04 DIAGNOSIS — Y92009 Unspecified place in unspecified non-institutional (private) residence as the place of occurrence of the external cause: Secondary | ICD-10-CM | POA: Diagnosis not present

## 2020-09-04 DIAGNOSIS — E876 Hypokalemia: Secondary | ICD-10-CM | POA: Insufficient documentation

## 2020-09-04 DIAGNOSIS — S00501A Unspecified superficial injury of lip, initial encounter: Secondary | ICD-10-CM | POA: Diagnosis present

## 2020-09-04 DIAGNOSIS — S01511A Laceration without foreign body of lip, initial encounter: Secondary | ICD-10-CM | POA: Insufficient documentation

## 2020-09-04 DIAGNOSIS — I10 Essential (primary) hypertension: Secondary | ICD-10-CM | POA: Insufficient documentation

## 2020-09-04 DIAGNOSIS — R519 Headache, unspecified: Secondary | ICD-10-CM | POA: Insufficient documentation

## 2020-09-04 DIAGNOSIS — M549 Dorsalgia, unspecified: Secondary | ICD-10-CM

## 2020-09-04 DIAGNOSIS — Z87891 Personal history of nicotine dependence: Secondary | ICD-10-CM | POA: Diagnosis not present

## 2020-09-04 LAB — BASIC METABOLIC PANEL
Anion gap: 9 (ref 5–15)
BUN: 14 mg/dL (ref 8–23)
CO2: 28 mmol/L (ref 22–32)
Calcium: 8.6 mg/dL — ABNORMAL LOW (ref 8.9–10.3)
Chloride: 101 mmol/L (ref 98–111)
Creatinine, Ser: 0.69 mg/dL (ref 0.44–1.00)
GFR, Estimated: >60 mL/min (ref >60)
Glucose, Bld: 125 mg/dL — ABNORMAL HIGH (ref 70–99)
Potassium: 2.7 mmol/L — CL (ref 3.5–5.1)
Sodium: 138 mmol/L (ref 135–145)

## 2020-09-04 LAB — CBC WITH DIFFERENTIAL/PLATELET
Abs Immature Granulocytes: 0.01 10*3/uL (ref 0.00–0.07)
Basophils Absolute: 0 10*3/uL (ref 0.0–0.1)
Basophils Relative: 1 %
Eosinophils Absolute: 0.1 10*3/uL (ref 0.0–0.5)
Eosinophils Relative: 2 %
HCT: 37.4 % (ref 36.0–46.0)
Hemoglobin: 11.4 g/dL — ABNORMAL LOW (ref 12.0–15.0)
Immature Granulocytes: 0 %
Lymphocytes Relative: 29 %
Lymphs Abs: 2.3 10*3/uL (ref 0.7–4.0)
MCH: 21.2 pg — ABNORMAL LOW (ref 26.0–34.0)
MCHC: 30.5 g/dL (ref 30.0–36.0)
MCV: 69.6 fL — ABNORMAL LOW (ref 80.0–100.0)
Monocytes Absolute: 0.6 10*3/uL (ref 0.1–1.0)
Monocytes Relative: 8 %
Neutro Abs: 5 10*3/uL (ref 1.7–7.7)
Neutrophils Relative %: 60 %
Platelets: 216 10*3/uL (ref 150–400)
RBC: 5.37 MIL/uL — ABNORMAL HIGH (ref 3.87–5.11)
RDW: 15.5 % (ref 11.5–15.5)
WBC: 8.1 10*3/uL (ref 4.0–10.5)
nRBC: 0 % (ref 0.0–0.2)

## 2020-09-04 LAB — TROPONIN I (HIGH SENSITIVITY)
Troponin I (High Sensitivity): 9 ng/L (ref <18)
Troponin I (High Sensitivity): 9 ng/L (ref <18)

## 2020-09-04 MED ORDER — ONDANSETRON 4 MG PO TBDP
4.0000 mg | ORAL_TABLET | Freq: Once | ORAL | Status: AC
  Administered 2020-09-04: 4 mg via ORAL
  Filled 2020-09-04: qty 1

## 2020-09-04 MED ORDER — OXYCODONE-ACETAMINOPHEN 5-325 MG PO TABS
2.0000 | ORAL_TABLET | Freq: Once | ORAL | Status: AC
  Administered 2020-09-04: 2 via ORAL
  Filled 2020-09-04: qty 2

## 2020-09-04 MED ORDER — IOHEXOL 300 MG/ML  SOLN
100.0000 mL | Freq: Once | INTRAMUSCULAR | Status: AC | PRN
  Administered 2020-09-04: 100 mL via INTRAVENOUS

## 2020-09-04 MED ORDER — POTASSIUM CHLORIDE CRYS ER 20 MEQ PO TBCR: 20.0000 meq | EXTENDED_RELEASE_TABLET | Freq: Two times a day (BID) | ORAL | 0 refills | Status: DC

## 2020-09-04 MED ORDER — POTASSIUM CHLORIDE CRYS ER 20 MEQ PO TBCR
40.0000 meq | EXTENDED_RELEASE_TABLET | Freq: Once | ORAL | Status: AC
  Administered 2020-09-04: 40 meq via ORAL
  Filled 2020-09-04: qty 2

## 2020-09-04 MED ORDER — POTASSIUM CHLORIDE 10 MEQ/100ML IV SOLN
10.0000 meq | INTRAVENOUS | Status: AC
  Administered 2020-09-04 (×2): 10 meq via INTRAVENOUS
  Filled 2020-09-04 (×2): qty 100

## 2020-09-04 NOTE — Discharge Instructions (Addendum)
Your testing is negative for serious traumatic injury.  You may take Tylenol or ibuprofen as needed for aches and pain.  Your potassium was low and this should be rechecked by your doctor next week.  You also had a lung nodule that will need to be followed up with your doctor. Return to the ED if you develop difficulty breathing, chest pain, any other concerns.
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
## 2020-09-04 NOTE — ED Notes (Signed)
Patient transported to X-ray 
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
## 2020-09-04 NOTE — ED Notes (Signed)
Date and time results received: 09/04/20 0453  Test: potassium Critical Value: 2.7  Name of Provider Notified: Rancour, MD  Orders Received? Or Actions Taken?: acknowledged
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
## 2021-02-22 ENCOUNTER — Encounter (HOSPITAL_COMMUNITY): Payer: Self-pay

## 2021-02-22 ENCOUNTER — Inpatient Hospital Stay (HOSPITAL_COMMUNITY)
Admission: EM | Admit: 2021-02-22 | Discharge: 2021-02-26 | DRG: 418 | Disposition: A | Discharge status: Home / Self Care | Payer: Medicaid Other | Attending: Family Medicine | Admitting: Family Medicine

## 2021-02-22 ENCOUNTER — Other Ambulatory Visit: Payer: Self-pay

## 2021-02-22 DIAGNOSIS — G4730 Sleep apnea, unspecified: Secondary | ICD-10-CM | POA: Diagnosis present

## 2021-02-22 DIAGNOSIS — Z791 Long term (current) use of non-steroidal anti-inflammatories (NSAID): Secondary | ICD-10-CM

## 2021-02-22 DIAGNOSIS — D509 Iron deficiency anemia, unspecified: Secondary | ICD-10-CM | POA: Diagnosis present

## 2021-02-22 DIAGNOSIS — F1210 Cannabis abuse, uncomplicated: Secondary | ICD-10-CM | POA: Diagnosis present

## 2021-02-22 DIAGNOSIS — Z8249 Family history of ischemic heart disease and other diseases of the circulatory system: Secondary | ICD-10-CM

## 2021-02-22 DIAGNOSIS — K82A1 Gangrene of gallbladder in cholecystitis: Secondary | ICD-10-CM | POA: Diagnosis present

## 2021-02-22 DIAGNOSIS — K810 Acute cholecystitis: Secondary | ICD-10-CM

## 2021-02-22 DIAGNOSIS — Z79899 Other long term (current) drug therapy: Secondary | ICD-10-CM

## 2021-02-22 DIAGNOSIS — E876 Hypokalemia: Secondary | ICD-10-CM

## 2021-02-22 DIAGNOSIS — R1011 Right upper quadrant pain: Secondary | ICD-10-CM

## 2021-02-22 DIAGNOSIS — M549 Dorsalgia, unspecified: Secondary | ICD-10-CM | POA: Diagnosis present

## 2021-02-22 DIAGNOSIS — R17 Unspecified jaundice: Secondary | ICD-10-CM

## 2021-02-22 DIAGNOSIS — Z7951 Long term (current) use of inhaled steroids: Secondary | ICD-10-CM

## 2021-02-22 DIAGNOSIS — Z90710 Acquired absence of both cervix and uterus: Secondary | ICD-10-CM

## 2021-02-22 DIAGNOSIS — Z888 Allergy status to other drugs, medicaments and biological substances status: Secondary | ICD-10-CM

## 2021-02-22 DIAGNOSIS — Z20822 Contact with and (suspected) exposure to covid-19: Secondary | ICD-10-CM | POA: Diagnosis present

## 2021-02-22 DIAGNOSIS — Z8261 Family history of arthritis: Secondary | ICD-10-CM

## 2021-02-22 DIAGNOSIS — F17210 Nicotine dependence, cigarettes, uncomplicated: Secondary | ICD-10-CM | POA: Diagnosis present

## 2021-02-22 DIAGNOSIS — Z800 Family history of malignant neoplasm of digestive organs: Secondary | ICD-10-CM

## 2021-02-22 DIAGNOSIS — Z825 Family history of asthma and other chronic lower respiratory diseases: Secondary | ICD-10-CM

## 2021-02-22 DIAGNOSIS — J449 Chronic obstructive pulmonary disease, unspecified: Secondary | ICD-10-CM | POA: Diagnosis present

## 2021-02-22 DIAGNOSIS — G8929 Other chronic pain: Secondary | ICD-10-CM | POA: Diagnosis present

## 2021-02-22 DIAGNOSIS — K8000 Calculus of gallbladder with acute cholecystitis without obstruction: Principal | ICD-10-CM | POA: Diagnosis present

## 2021-02-22 DIAGNOSIS — N390 Urinary tract infection, site not specified: Secondary | ICD-10-CM | POA: Diagnosis present

## 2021-02-22 DIAGNOSIS — M797 Fibromyalgia: Secondary | ICD-10-CM | POA: Diagnosis present

## 2021-02-22 DIAGNOSIS — Z887 Allergy status to serum and vaccine status: Secondary | ICD-10-CM

## 2021-02-22 DIAGNOSIS — N179 Acute kidney failure, unspecified: Secondary | ICD-10-CM

## 2021-02-22 DIAGNOSIS — Z83438 Family history of other disorder of lipoprotein metabolism and other lipidemia: Secondary | ICD-10-CM

## 2021-02-22 DIAGNOSIS — Z833 Family history of diabetes mellitus: Secondary | ICD-10-CM

## 2021-02-22 DIAGNOSIS — F1410 Cocaine abuse, uncomplicated: Secondary | ICD-10-CM | POA: Diagnosis present

## 2021-02-22 DIAGNOSIS — I10 Essential (primary) hypertension: Secondary | ICD-10-CM | POA: Diagnosis present

## 2021-02-22 LAB — CBC
HCT: 46.5 % — ABNORMAL HIGH (ref 36.0–46.0)
Hemoglobin: 15.1 g/dL — ABNORMAL HIGH (ref 12.0–15.0)
MCH: 22.2 pg — ABNORMAL LOW (ref 26.0–34.0)
MCHC: 32.5 g/dL (ref 30.0–36.0)
MCV: 68.3 fL — ABNORMAL LOW (ref 80.0–100.0)
Platelets: 283 10*3/uL (ref 150–400)
RBC: 6.81 MIL/uL — ABNORMAL HIGH (ref 3.87–5.11)
RDW: 18.9 % — ABNORMAL HIGH (ref 11.5–15.5)
WBC: 25.8 10*3/uL — ABNORMAL HIGH (ref 4.0–10.5)
nRBC: 0 % (ref 0.0–0.2)

## 2021-02-22 LAB — LIPASE, BLOOD: Lipase: 24 U/L (ref 11–51)

## 2021-02-22 LAB — COMPREHENSIVE METABOLIC PANEL
ALT: 20 U/L (ref 0–44)
AST: 19 U/L (ref 15–41)
Albumin: 4.2 g/dL (ref 3.5–5.0)
Alkaline Phosphatase: 92 U/L (ref 38–126)
Anion gap: 12 (ref 5–15)
BUN: 15 mg/dL (ref 8–23)
CO2: 26 mmol/L (ref 22–32)
Calcium: 9.4 mg/dL (ref 8.9–10.3)
Chloride: 99 mmol/L (ref 98–111)
Creatinine, Ser: 1.2 mg/dL — ABNORMAL HIGH (ref 0.44–1.00)
GFR, Estimated: 52 mL/min — ABNORMAL LOW (ref >60)
Glucose, Bld: 120 mg/dL — ABNORMAL HIGH (ref 70–99)
Potassium: 3.3 mmol/L — ABNORMAL LOW (ref 3.5–5.1)
Sodium: 137 mmol/L (ref 135–145)
Total Bilirubin: 1.8 mg/dL — ABNORMAL HIGH (ref 0.3–1.2)
Total Protein: 8.3 g/dL — ABNORMAL HIGH (ref 6.5–8.1)

## 2021-02-22 NOTE — ED Triage Notes (Signed)
Emesis since yesterday, right RLQ pain Unable to keep anything down.

## 2021-02-23 ENCOUNTER — Inpatient Hospital Stay (HOSPITAL_COMMUNITY): Payer: Medicaid Other

## 2021-02-23 ENCOUNTER — Emergency Department (HOSPITAL_COMMUNITY): Payer: Medicaid Other

## 2021-02-23 DIAGNOSIS — Z8261 Family history of arthritis: Secondary | ICD-10-CM | POA: Diagnosis not present

## 2021-02-23 DIAGNOSIS — Z800 Family history of malignant neoplasm of digestive organs: Secondary | ICD-10-CM | POA: Diagnosis not present

## 2021-02-23 DIAGNOSIS — Z90710 Acquired absence of both cervix and uterus: Secondary | ICD-10-CM | POA: Diagnosis not present

## 2021-02-23 DIAGNOSIS — R109 Unspecified abdominal pain: Secondary | ICD-10-CM | POA: Diagnosis present

## 2021-02-23 DIAGNOSIS — F1410 Cocaine abuse, uncomplicated: Secondary | ICD-10-CM | POA: Diagnosis present

## 2021-02-23 DIAGNOSIS — Z833 Family history of diabetes mellitus: Secondary | ICD-10-CM | POA: Diagnosis not present

## 2021-02-23 DIAGNOSIS — K82A1 Gangrene of gallbladder in cholecystitis: Secondary | ICD-10-CM | POA: Diagnosis present

## 2021-02-23 DIAGNOSIS — N179 Acute kidney failure, unspecified: Secondary | ICD-10-CM

## 2021-02-23 DIAGNOSIS — Z825 Family history of asthma and other chronic lower respiratory diseases: Secondary | ICD-10-CM | POA: Diagnosis not present

## 2021-02-23 DIAGNOSIS — K8000 Calculus of gallbladder with acute cholecystitis without obstruction: Secondary | ICD-10-CM | POA: Diagnosis present

## 2021-02-23 DIAGNOSIS — G4730 Sleep apnea, unspecified: Secondary | ICD-10-CM | POA: Diagnosis present

## 2021-02-23 DIAGNOSIS — F17210 Nicotine dependence, cigarettes, uncomplicated: Secondary | ICD-10-CM | POA: Diagnosis present

## 2021-02-23 DIAGNOSIS — Z791 Long term (current) use of non-steroidal anti-inflammatories (NSAID): Secondary | ICD-10-CM | POA: Diagnosis not present

## 2021-02-23 DIAGNOSIS — K810 Acute cholecystitis: Secondary | ICD-10-CM

## 2021-02-23 DIAGNOSIS — M549 Dorsalgia, unspecified: Secondary | ICD-10-CM | POA: Diagnosis present

## 2021-02-23 DIAGNOSIS — F1210 Cannabis abuse, uncomplicated: Secondary | ICD-10-CM | POA: Diagnosis present

## 2021-02-23 DIAGNOSIS — D509 Iron deficiency anemia, unspecified: Secondary | ICD-10-CM | POA: Diagnosis present

## 2021-02-23 DIAGNOSIS — Z8249 Family history of ischemic heart disease and other diseases of the circulatory system: Secondary | ICD-10-CM | POA: Diagnosis not present

## 2021-02-23 DIAGNOSIS — E876 Hypokalemia: Secondary | ICD-10-CM | POA: Diagnosis present

## 2021-02-23 DIAGNOSIS — M797 Fibromyalgia: Secondary | ICD-10-CM | POA: Diagnosis present

## 2021-02-23 DIAGNOSIS — J449 Chronic obstructive pulmonary disease, unspecified: Secondary | ICD-10-CM | POA: Diagnosis present

## 2021-02-23 DIAGNOSIS — Z20822 Contact with and (suspected) exposure to covid-19: Secondary | ICD-10-CM | POA: Diagnosis present

## 2021-02-23 DIAGNOSIS — N390 Urinary tract infection, site not specified: Secondary | ICD-10-CM | POA: Diagnosis present

## 2021-02-23 DIAGNOSIS — G8929 Other chronic pain: Secondary | ICD-10-CM | POA: Diagnosis present

## 2021-02-23 DIAGNOSIS — I10 Essential (primary) hypertension: Secondary | ICD-10-CM

## 2021-02-23 DIAGNOSIS — Z83438 Family history of other disorder of lipoprotein metabolism and other lipidemia: Secondary | ICD-10-CM | POA: Diagnosis not present

## 2021-02-23 LAB — RAPID URINE DRUG SCREEN, HOSP PERFORMED
Amphetamines: NOT DETECTED
Barbiturates: NOT DETECTED
Benzodiazepines: NOT DETECTED
Cocaine: POSITIVE — AB
Opiates: NOT DETECTED
Tetrahydrocannabinol: POSITIVE — AB

## 2021-02-23 LAB — COMPREHENSIVE METABOLIC PANEL
ALT: 15 U/L (ref 0–44)
AST: 18 U/L (ref 15–41)
Albumin: 3.4 g/dL — ABNORMAL LOW (ref 3.5–5.0)
Alkaline Phosphatase: 75 U/L (ref 38–126)
Anion gap: 12 (ref 5–15)
BUN: 16 mg/dL (ref 8–23)
CO2: 27 mmol/L (ref 22–32)
Calcium: 8.5 mg/dL — ABNORMAL LOW (ref 8.9–10.3)
Chloride: 99 mmol/L (ref 98–111)
Creatinine, Ser: 1.15 mg/dL — ABNORMAL HIGH (ref 0.44–1.00)
GFR, Estimated: 54 mL/min — ABNORMAL LOW (ref >60)
Glucose, Bld: 98 mg/dL (ref 70–99)
Potassium: 3.9 mmol/L (ref 3.5–5.1)
Sodium: 138 mmol/L (ref 135–145)
Total Bilirubin: 1.2 mg/dL (ref 0.3–1.2)
Total Protein: 7.4 g/dL (ref 6.5–8.1)

## 2021-02-23 LAB — URINALYSIS, ROUTINE W REFLEX MICROSCOPIC
Glucose, UA: 100 mg/dL — AB
Hgb urine dipstick: NEGATIVE
Ketones, ur: 15 mg/dL — AB
Nitrite: POSITIVE — AB
Protein, ur: 100 mg/dL — AB
Specific Gravity, Urine: >1.03 — ABNORMAL HIGH (ref 1.005–1.030)
pH: 5 (ref 5.0–8.0)

## 2021-02-23 LAB — URINALYSIS, MICROSCOPIC (REFLEX)

## 2021-02-23 LAB — CBC
HCT: 43 % (ref 36.0–46.0)
Hemoglobin: 13.7 g/dL (ref 12.0–15.0)
MCH: 22 pg — ABNORMAL LOW (ref 26.0–34.0)
MCHC: 31.9 g/dL (ref 30.0–36.0)
MCV: 69 fL — ABNORMAL LOW (ref 80.0–100.0)
Platelets: 230 10*3/uL (ref 150–400)
RBC: 6.23 MIL/uL — ABNORMAL HIGH (ref 3.87–5.11)
RDW: 18.5 % — ABNORMAL HIGH (ref 11.5–15.5)
WBC: 23 10*3/uL — ABNORMAL HIGH (ref 4.0–10.5)
nRBC: 0 % (ref 0.0–0.2)

## 2021-02-23 LAB — RESP PANEL BY RT-PCR (FLU A&B, COVID) ARPGX2
Influenza A by PCR: NEGATIVE
Influenza B by PCR: NEGATIVE
SARS Coronavirus 2 by RT PCR: NEGATIVE

## 2021-02-23 LAB — GLUCOSE, CAPILLARY
Glucose-Capillary: 90 mg/dL (ref 70–99)
Glucose-Capillary: 91 mg/dL (ref 70–99)

## 2021-02-23 LAB — SURGICAL PCR SCREEN
MRSA, PCR: NEGATIVE
Staphylococcus aureus: NEGATIVE

## 2021-02-23 LAB — TSH: TSH: 0.443 u[IU]/mL (ref 0.350–4.500)

## 2021-02-23 LAB — HIV ANTIBODY (ROUTINE TESTING W REFLEX): HIV Screen 4th Generation wRfx: NONREACTIVE

## 2021-02-23 MED ORDER — SODIUM CHLORIDE 0.9 % IV SOLN: INTRAVENOUS | Status: DC

## 2021-02-23 MED ORDER — PIPERACILLIN-TAZOBACTAM 3.375 G IVPB
3.3750 g | Freq: Three times a day (TID) | INTRAVENOUS | Status: DC
  Administered 2021-02-23 – 2021-02-26 (×7): 3.375 g via INTRAVENOUS
  Filled 2021-02-23 (×8): qty 50

## 2021-02-23 MED ORDER — LACTATED RINGERS IV BOLUS
1000.0000 mL | Freq: Once | INTRAVENOUS | Status: AC
  Administered 2021-02-23: 1000 mL via INTRAVENOUS

## 2021-02-23 MED ORDER — HEPARIN SODIUM (PORCINE) 5000 UNIT/ML IJ SOLN
5000.0000 [IU] | Freq: Three times a day (TID) | INTRAMUSCULAR | Status: DC
  Administered 2021-02-23 – 2021-02-24 (×6): 5000 [IU] via SUBCUTANEOUS
  Filled 2021-02-23 (×7): qty 1

## 2021-02-23 MED ORDER — ACETAMINOPHEN 650 MG RE SUPP: 650.0000 mg | Freq: Four times a day (QID) | RECTAL | Status: DC | PRN

## 2021-02-23 MED ORDER — ONDANSETRON HCL 4 MG/2ML IJ SOLN
4.0000 mg | Freq: Four times a day (QID) | INTRAMUSCULAR | Status: DC | PRN
  Administered 2021-02-23 – 2021-02-25 (×4): 4 mg via INTRAVENOUS
  Filled 2021-02-23 (×5): qty 2

## 2021-02-23 MED ORDER — OXYCODONE HCL 5 MG PO TABS: 5.0000 mg | ORAL_TABLET | ORAL | Status: DC | PRN

## 2021-02-23 MED ORDER — DEXTROSE-NACL 5-0.45 % IV SOLN: INTRAVENOUS | Status: AC

## 2021-02-23 MED ORDER — ALBUTEROL SULFATE HFA 108 (90 BASE) MCG/ACT IN AERS: 1.0000 | INHALATION_SPRAY | Freq: Four times a day (QID) | RESPIRATORY_TRACT | Status: DC | PRN

## 2021-02-23 MED ORDER — PIPERACILLIN-TAZOBACTAM 3.375 G IVPB 30 MIN: 3.3750 g | Freq: Four times a day (QID) | INTRAVENOUS | Status: DC

## 2021-02-23 MED ORDER — SODIUM CHLORIDE 0.9 % IV SOLN
2.0000 g | Freq: Once | INTRAVENOUS | Status: AC
  Administered 2021-02-23: 2 g via INTRAVENOUS
  Filled 2021-02-23: qty 20

## 2021-02-23 MED ORDER — MORPHINE SULFATE (PF) 4 MG/ML IV SOLN
4.0000 mg | Freq: Once | INTRAVENOUS | Status: AC
  Administered 2021-02-23: 4 mg via INTRAVENOUS
  Filled 2021-02-23: qty 1

## 2021-02-23 MED ORDER — AMLODIPINE BESYLATE 5 MG PO TABS
10.0000 mg | ORAL_TABLET | Freq: Every day | ORAL | Status: DC
  Administered 2021-02-23 – 2021-02-26 (×3): 10 mg via ORAL
  Filled 2021-02-23 (×4): qty 2

## 2021-02-23 MED ORDER — ALBUTEROL SULFATE (2.5 MG/3ML) 0.083% IN NEBU: 2.5000 mg | INHALATION_SOLUTION | Freq: Four times a day (QID) | RESPIRATORY_TRACT | Status: DC | PRN

## 2021-02-23 MED ORDER — VENLAFAXINE HCL ER 75 MG PO CP24
300.0000 mg | ORAL_CAPSULE | Freq: Every day | ORAL | Status: DC
  Administered 2021-02-23 – 2021-02-26 (×3): 300 mg via ORAL
  Filled 2021-02-23 (×3): qty 4

## 2021-02-23 MED ORDER — IOHEXOL 350 MG/ML SOLN
80.0000 mL | Freq: Once | INTRAVENOUS | Status: AC | PRN
  Administered 2021-02-23: 80 mL via INTRAVENOUS

## 2021-02-23 MED ORDER — POTASSIUM CHLORIDE 10 MEQ/100ML IV SOLN
10.0000 meq | INTRAVENOUS | Status: AC
Start: 2021-02-23 — End: 2021-02-23
  Administered 2021-02-23 (×3): 10 meq via INTRAVENOUS
  Filled 2021-02-23 (×3): qty 100

## 2021-02-23 MED ORDER — MORPHINE SULFATE (PF) 4 MG/ML IV SOLN
4.0000 mg | Freq: Once | INTRAVENOUS | Status: AC
Start: 2021-02-23 — End: 2021-02-23
  Administered 2021-02-23: 4 mg via INTRAVENOUS
  Filled 2021-02-23: qty 1

## 2021-02-23 MED ORDER — IPRATROPIUM-ALBUTEROL 0.5-2.5 (3) MG/3ML IN SOLN: 3.0000 mL | RESPIRATORY_TRACT | Status: DC | PRN

## 2021-02-23 MED ORDER — TRAZODONE HCL 50 MG PO TABS: 50.0000 mg | ORAL_TABLET | Freq: Every evening | ORAL | Status: DC | PRN

## 2021-02-23 MED ORDER — LURASIDONE HCL 40 MG PO TABS
60.0000 mg | ORAL_TABLET | Freq: Every day | ORAL | Status: DC
  Administered 2021-02-23 – 2021-02-24 (×2): 60 mg via ORAL
  Filled 2021-02-23 (×3): qty 2

## 2021-02-23 MED ORDER — MOMETASONE FURO-FORMOTEROL FUM 200-5 MCG/ACT IN AERO
2.0000 | INHALATION_SPRAY | Freq: Two times a day (BID) | RESPIRATORY_TRACT | Status: DC
  Administered 2021-02-23 – 2021-02-26 (×7): 2 via RESPIRATORY_TRACT
  Filled 2021-02-23: qty 8.8

## 2021-02-23 MED ORDER — HYDRALAZINE HCL 20 MG/ML IJ SOLN: 10.0000 mg | INTRAMUSCULAR | Status: DC | PRN

## 2021-02-23 MED ORDER — ONDANSETRON HCL 4 MG PO TABS: 4.0000 mg | ORAL_TABLET | Freq: Four times a day (QID) | ORAL | Status: DC | PRN

## 2021-02-23 MED ORDER — ONDANSETRON HCL 4 MG/2ML IJ SOLN
4.0000 mg | Freq: Once | INTRAMUSCULAR | Status: AC
  Administered 2021-02-23: 4 mg via INTRAVENOUS
  Filled 2021-02-23: qty 2

## 2021-02-23 MED ORDER — MORPHINE SULFATE (PF) 2 MG/ML IV SOLN
2.0000 mg | INTRAVENOUS | Status: DC | PRN
Start: 2021-02-23 — End: 2021-02-25
  Administered 2021-02-23 – 2021-02-25 (×11): 2 mg via INTRAVENOUS
  Filled 2021-02-23 (×11): qty 1

## 2021-02-23 MED ORDER — ACETAMINOPHEN 325 MG PO TABS: 650.0000 mg | ORAL_TABLET | Freq: Four times a day (QID) | ORAL | Status: DC | PRN

## 2021-02-23 MED ORDER — VENLAFAXINE HCL ER 37.5 MG PO CP24
300.0000 mg | ORAL_CAPSULE | Freq: Every day | ORAL | Status: DC
  Filled 2021-02-23: qty 8

## 2021-02-23 MED ORDER — OXYCODONE HCL 5 MG PO TABS
5.0000 mg | ORAL_TABLET | ORAL | Status: DC | PRN
  Administered 2021-02-24 – 2021-02-26 (×2): 5 mg via ORAL
  Filled 2021-02-23 (×2): qty 1

## 2021-02-23 MED ORDER — MUPIROCIN 2 % EX OINT
1.0000 "application " | TOPICAL_OINTMENT | Freq: Two times a day (BID) | CUTANEOUS | Status: DC
  Administered 2021-02-23 – 2021-02-24 (×3): 1 via NASAL
  Filled 2021-02-23: qty 22

## 2021-02-23 NOTE — Progress Notes (Signed)
Patient stated she has CPAP machine at home. Refused to use hospital CPAP machine. MD aware.

## 2021-02-23 NOTE — ED Provider Notes (Signed)
[facility] EMERGENCY DEPARTMENT Provider Note   CSN: [id number] Arrival date & time: 02/22/21  1828     History Chief Complaint  Patient presents with   Emesis   Abdominal Pain    Vicki Dean is a 60 y.o. female.  The history is provided by the patient.  Emesis Associated symptoms: abdominal pain   Abdominal Pain Associated symptoms: vomiting   She has history of hypertension, COPD and comes in complaining of right-sided abdominal pain which started yesterday and is getting worse.  Pain does not radiate.  It is severe.  There is associated nausea and vomiting.  She denies any constipation or diarrhea and she denies any urinary difficulty.  She denies fever, chills, sweats.  Pain is rated at 7/10.  She is status post hysterectomy but still has her appendix and gallbladder.   Past Medical History:  Diagnosis Date   Anxiety    Arthritis    possibly her right shoulder.   Chronic back pain    COPD (chronic obstructive pulmonary disease) (HCC)    Fibromyalgia    GERD (gastroesophageal reflux disease)    Hypertension    Sleep apnea    could not tolerate    Patient Active Problem List   Diagnosis Date Noted   Lower abdominal pain 05/05/2019   Microcytic anemia 10/25/2017   Constipation 10/25/2017   Hematochezia 07/31/2017   Dysphagia 07/31/2017   ANEMIA-NOS 07/06/2006   DEPRESSION 07/06/2006   COMMON MIGRAINE 07/06/2006   HYPERTENSION 07/06/2006   GERD (gastroesophageal reflux disease) 07/06/2006   IRRITABLE BOWEL SYNDROME 07/06/2006   INFECTION, URINARY TRACT NOS 07/06/2006   DYSFUNCTIONAL UTERINE BLEEDING 07/06/2006   LOW BACK PAIN 07/06/2006    Past Surgical History:  Procedure Laterality Date   ABDOMINAL HYSTERECTOMY     CARPAL TUNNEL RELEASE Left    COLONOSCOPY WITH PROPOFOL N/A 08/23/2017   Dr. Jena Gaussourk, grade II hemorrhoids. next TCS in 10 years.    ESOPHAGOGASTRODUODENOSCOPY (EGD) WITH PROPOFOL N/A 08/23/2017   Dr. Jena Gaussourk: mild erosive reflux esophagitis s/p  dilation due to h/o dysphagia.    FOOT SURGERY Right    screws from fracture   GANGLION CYST EXCISION Right    foot   KNEE SURGERY Right    arthroscopy   MALONEY DILATION N/A 08/23/2017   Procedure: Elease HashimotoMALONEY DILATION;  Surgeon: Corbin Adeourk, Robert M, MD;  Location: [facility];  Service: Endoscopy;  Laterality: N/A;     OB History     Gravida  3   Para  2   Term  2   Preterm      AB  1   Living         SAB  1   IAB      Ectopic      Multiple      Live Births              Family History  Problem Relation Age of Onset   Hypertension Mother    Diabetes Mother    Arthritis Mother    Hypercholesterolemia Mother    Emphysema Father    Cancer Other    Diabetes Other    Colon cancer Neg Hx    Gastric cancer Neg Hx    Esophageal cancer Neg Hx     Social History   Tobacco Use   Smoking status: Former    Packs/day: 1.00    Years: 20.00    Pack years: 20.00    Types: Cigarettes  Quit date: 08/20/2013    Years since quitting: 7.5   Smokeless tobacco: Never  Vaping Use   Vaping Use: Never used  Substance Use Topics   Alcohol use: Not Currently    Comment: in the past   Drug use: Not Currently    Types: Marijuana    Comment: denied 05/05/19    Home Medications Prior to Admission medications   Medication Sig Start Date End Date Taking? Authorizing Provider  albuterol (PROVENTIL HFA;VENTOLIN HFA) 108 (90 Base) MCG/ACT inhaler Inhale 1-2 puffs into the lungs every 6 (six) hours as needed for wheezing or shortness of breath.    [provider]  amLODipine (NORVASC) 10 MG tablet Take 10 mg by mouth daily.    [provider]  budesonide-formoterol (SYMBICORT) 160-4.5 MCG/ACT inhaler Inhale 2 puffs into the lungs 2 (two) times daily.    [provider]  cephALEXin (KEFLEX) 500 MG capsule Take 1 capsule (500 mg total) by mouth 4 (four) times daily. Patient not taking: No sig reported 08/12/19   Terrilee Files, MD  Cholecalciferol 25  MCG (1000 UT) tablet Take 2 tablets by mouth daily. 06/08/20   [provider]  ferrous sulfate 325 (65 FE) MG tablet Take 325 mg by mouth daily.    [provider]  gabapentin (NEURONTIN) 800 MG tablet Take 400 mg by mouth in the morning, at noon, and at bedtime. 06/08/20   [provider]  hydrochlorothiazide (HYDRODIURIL) 25 MG tablet Take 12.5 mg by mouth daily.     [provider]  lidocaine (LIDODERM) 5 % Place 1 patch onto the skin daily as needed (pain). 04/20/20   [provider]  Lurasidone HCl 120 MG TABS Take 0.5 tablets by mouth at bedtime. 10/25/19   [provider]  meloxicam (MOBIC) 15 MG tablet TAKE 1 TABLET BY MOUTH ONCE DAILY WITH FOOD Patient not taking: Reported on 09/04/2020 04/25/18   [provider]  naproxen (NAPROSYN) 500 MG tablet Take 1 tablet by mouth in the morning and at bedtime. 06/08/20   [provider]  phenazopyridine (PYRIDIUM) 200 MG tablet Take 1 tablet (200 mg total) by mouth 3 (three) times daily. Patient not taking: No sig reported 08/12/19   Terrilee Files, MD  potassium chloride SA (KLOR-CON) 20 MEQ tablet Take 1 tablet (20 mEq total) by mouth 2 (two) times daily. 09/04/20   Rancour, Jeannett Senior, MD  venlafaxine XR (EFFEXOR-XR) 150 MG 24 hr capsule Take 2 capsules by mouth daily. 06/08/20   [provider]    Allergies    Doxepin, Lisinopril, Omeprazole, and Pneumococcal vaccines  Review of Systems   Review of Systems  Gastrointestinal:  Positive for abdominal pain and vomiting.  All other systems reviewed and are negative.  Physical Exam Updated Vital Signs BP 131/86 (BP Location: Left Arm)   Pulse 94   Temp 98.6 F (37 C) (Oral)   Resp 16   Ht 5\' 7"  (1.702 m)   Wt 102.1 kg   SpO2 96%   BMI 35.25 kg/m   Physical Exam Vitals and nursing note reviewed.  60 year old female, appears uncomfortable, but is in no acute distress. Vital signs are normal. Oxygen saturation  is 96%, which is normal. Head is normocephalic and atraumatic. PERRLA, EOMI. Oropharynx is clear. Neck is nontender and supple without adenopathy or JVD. Back is nontender and there is no CVA tenderness. Lungs are clear without rales, wheezes, or rhonchi. Chest is nontender. Heart has regular rate  and rhythm without murmur. Abdomen is soft, flat, with severe right mid abdominal tenderness.  There is no voluntary guarding but there is generalized rebound tenderness.  There are no masses or hepatosplenomegaly and peristalsis is hypactive. Extremities have no cyanosis or edema, full range of motion is present. Skin is warm and dry without rash. Neurologic: Mental status is normal, cranial nerves are intact, there are no motor or sensory deficits.  ED Results / Procedures / Treatments   Labs (all labs ordered are listed, but only abnormal results are displayed) Labs Reviewed  COMPREHENSIVE METABOLIC PANEL - Abnormal; Notable for the following components:      Result Value   Potassium 3.3 (*)    Glucose, Bld 120 (*)    Creatinine, Ser 1.20 (*)    Total Protein 8.3 (*)    Total Bilirubin 1.8 (*)    GFR, Estimated 52 (*)    All other components within normal limits  CBC - Abnormal; Notable for the following components:   WBC 25.8 (*)    RBC 6.81 (*)    Hemoglobin 15.1 (*)    HCT 46.5 (*)    MCV 68.3 (*)    MCH 22.2 (*)    RDW 18.9 (*)    All other components within normal limits  URINALYSIS, ROUTINE W REFLEX MICROSCOPIC - Abnormal; Notable for the following components:   Color, Urine AMBER (*)    APPearance CLOUDY (*)    Specific Gravity, Urine >1.030 (*)    Glucose, UA 100 (*)    Bilirubin Urine MODERATE (*)    Ketones, ur 15 (*)    Protein, ur 100 (*)    Nitrite POSITIVE (*)    Leukocytes,Ua TRACE (*)    All other components within normal limits  URINALYSIS, MICROSCOPIC (REFLEX) - Abnormal; Notable for the following components:   Bacteria, UA FEW (*)    All other components  within normal limits  RESP PANEL BY RT-PCR (FLU A&B, COVID) ARPGX2  LIPASE, BLOOD   Radiology CT ABDOMEN PELVIS W CONTRAST  Result Date: 02/23/2021 CLINICAL DATA:  Right lower quadrant pain EXAM: CT ABDOMEN AND PELVIS WITH CONTRAST TECHNIQUE: Multidetector CT imaging of the abdomen and pelvis was performed using the standard protocol following bolus administration of intravenous contrast. CONTRAST:  61mL OMNIPAQUE IOHEXOL 350 MG/ML SOLN COMPARISON:  09/04/2020 FINDINGS: Lower chest: Linear scarring or atelectasis in the lung bases. No effusions. Hepatobiliary: Gallbladder is distended with thick walls, pericholecystic fluid and stranding compatible with cholecystitis. Small hypodensity within the liver likely reflect small cyst. No suspicious focal hepatic abnormality. Pancreas: No focal abnormality or ductal dilatation. Spleen: No focal abnormality.  Normal size. Adrenals/Urinary Tract: Small scattered cortical cysts. No hydronephrosis. Adrenal glands and urinary bladder unremarkable. Stomach/Bowel: Stomach, large and small bowel grossly unremarkable. Vascular/Lymphatic: No evidence of aneurysm or adenopathy. Reproductive: Prior hysterectomy.  No adnexal masses. Other: No free fluid or free air. Musculoskeletal: No acute bony abnormality. IMPRESSION: Distended gallbladder with wall thickening, pericholecystic fluid and significant surrounding stranding compatible with acute cholecystitis. Electronically Signed   By: Charlett Nose M.D.   On: 02/23/2021 01:53    Procedures Procedures   Medications Ordered in ED Medications  lactated ringers bolus 1,000 mL (0 mLs Intravenous Stopped 02/23/21 0222)  morphine 4 MG/ML injection 4 mg (4 mg Intravenous Given 02/23/21 0041)  ondansetron (ZOFRAN) injection 4 mg (4 mg Intravenous Given 02/23/21 0040)  iohexol (OMNIPAQUE) 350 MG/ML injection 80 mL (80 mLs Intravenous Contrast Given 02/23/21 0124)  cefTRIAXone (ROCEPHIN)  2 g in sodium chloride 0.9 % 100 mL IVPB (0 g  Intravenous Stopped 02/23/21 0253)  morphine 4 MG/ML injection 4 mg (4 mg Intravenous Given 02/23/21 0256)    ED Course  I have reviewed the triage vital signs and the nursing notes.  Pertinent labs & imaging results that were available during my care of the patient were reviewed by me and considered in my medical decision making (see chart for details).   MDM Rules/Calculators/A&P                         Right-sided abdominal pain with vomiting.  Differential diagnosis includes diverticulitis, appendicitis, cholecystitis, urolithiasis, pyelonephritis.  Labs show marked leukocytosis with WBC 25.8.  Mild hypokalemia is probably secondary to vomiting.  Creatinine is mildly elevated, also probably secondary to vomiting.  This is a significant change compared with 09/04/2020 at which time creatinine was 0.69.  Bilirubin is also mildly elevated, possibly consistent with biliary tract disease.  She will be given IV fluids, morphine, ondansetron and will send for CT of abdomen and pelvis.  Old records are reviewed, and she has no relevant past visits.  CT scan shows evidence of acute cholecystitis.  She is started on ceftriaxone.  She did get good pain relief with morphine although she did require a second dose.  Case is discussed with Dr. Lovell Sheehan of general surgery service who states he will see the patient in consultation, requests she be admitted to the internal medicine service.  Case is discussed with Dr. Carren Rang of [facility] hospital[facility], who agrees to admit the patient.  Final Clinical Impression(s) / ED Diagnoses Final diagnoses:  Acute cholecystitis  Hypokalemia  Acute kidney injury (nontraumatic) (HCC)  Serum total bilirubin elevated    Rx / DC Orders ED Discharge Orders     None        Dione Booze, MD 02/23/21 [phone]

## 2021-02-23 NOTE — H&P (Signed)
Reason for Consult: Abdominal pain, emesis Referring Physician: Dione Booze, MD  Vicki Dean is an 60 y.o. female.  HPI: She endorses abdominal pain that began yesterday when she was laying down in bed and had an episode of acid reflux. The pain is in the right side of her abdomen and she states the pain is worst in the RUQ. She describes the pain as sharp and achy at an 8/10 intensity. Since the pain began she states any food or beverage she consumes makes the pain worse. She has had episodes of vomiting associated with her pain since it began yesterday that were initially brown in color and changed to yellow after the first episode. She also states she has had chills and night sweats since the pain began. She denies blood in her stool but states she has had blood in her urine. She does not endorse chest pain or shortness of breath. She states her last cocaine use was 5 days ago.  Past Medical History:  Diagnosis Date   Anxiety    Arthritis    possibly her right shoulder.   Chronic back pain    COPD (chronic obstructive pulmonary disease) (HCC)    Fibromyalgia    GERD (gastroesophageal reflux disease)    Hypertension    Sleep apnea    could not tolerate    Past Surgical History:  Procedure Laterality Date   ABDOMINAL HYSTERECTOMY     CARPAL TUNNEL RELEASE Left    COLONOSCOPY WITH PROPOFOL N/A 08/23/2017   Dr. Jena Gauss, grade II hemorrhoids. next TCS in 10 years.    ESOPHAGOGASTRODUODENOSCOPY (EGD) WITH PROPOFOL N/A 08/23/2017   Dr. Jena Gauss: mild erosive reflux esophagitis s/p dilation due to h/o dysphagia.    FOOT SURGERY Right    screws from fracture   GANGLION CYST EXCISION Right    foot   KNEE SURGERY Right    arthroscopy   MALONEY DILATION N/A 08/23/2017   Procedure: Elease Hashimoto DILATION;  Surgeon: Corbin Ade, MD;  Location: [facility];  Service: Endoscopy;  Laterality: N/A;    Family History  Problem Relation Age of Onset   Hypertension Mother    Diabetes Mother     Arthritis Mother    Hypercholesterolemia Mother    Emphysema Father    Cancer Other    Diabetes Other    Colon cancer Neg Hx    Gastric cancer Neg Hx    Esophageal cancer Neg Hx     Social History:  reports that she quit smoking about 7 years ago. Her smoking use included cigarettes. She has a 20.00 pack-year smoking history. She has never used smokeless tobacco. She reports that she does not currently use alcohol. She reports that she does not currently use drugs after having used the following drugs: Marijuana.  Allergies:  Allergies  Allergen Reactions   Doxepin     Pt unsure of reaction   Lisinopril     cough   Omeprazole     Doesn't agree with her   Pneumococcal Vaccines     Arm swelling    Medications: I have reviewed the patient's current medications.  Results for orders placed or performed during the hospital encounter of 02/22/21 (from the past 48 hour(s))  Urinalysis, Routine w reflex microscopic Urine, Clean Catch     Status: Abnormal   Collection Time: 02/22/21  7:46 PM  Result Value Ref Range   Color, Urine AMBER (A) YELLOW    Comment: BIOCHEMICALS MAY BE AFFECTED BY COLOR  APPearance CLOUDY (A) CLEAR   Specific Gravity, Urine >1.030 (H) 1.005 - 1.030   pH 5.0 5.0 - 8.0   Glucose, UA 100 (A) NEGATIVE mg/dL   Hgb urine dipstick NEGATIVE NEGATIVE   Bilirubin Urine MODERATE (A) NEGATIVE   Ketones, ur 15 (A) NEGATIVE mg/dL   Protein, ur 235 (A) NEGATIVE mg/dL   Nitrite POSITIVE (A) NEGATIVE   Leukocytes,Ua TRACE (A) NEGATIVE    Comment: Performed at [facility], [address] [place], [place] [address]  Urinalysis, Microscopic (reflex)     Status: Abnormal   Collection Time: 02/22/21  7:46 PM  Result Value Ref Range   RBC / HPF 0-5 0 - 5 RBC/hpf   WBC, UA 6-10 0 - 5 WBC/hpf   Bacteria, UA FEW (A) NONE SEEN   Squamous Epithelial / LPF 0-5 0 - 5    Comment: Performed at [facility], [address] [place], [place] [address]  Urine rapid drug screen (hosp  performed)     Status: Abnormal   Collection Time: 02/22/21  7:46 PM  Result Value Ref Range   Opiates NONE DETECTED NONE DETECTED   Cocaine POSITIVE (A) NONE DETECTED   Benzodiazepines NONE DETECTED NONE DETECTED   Amphetamines NONE DETECTED NONE DETECTED   Tetrahydrocannabinol POSITIVE (A) NONE DETECTED   Barbiturates NONE DETECTED NONE DETECTED    Comment: (NOTE) DRUG SCREEN FOR MEDICAL PURPOSES ONLY.  IF CONFIRMATION IS NEEDED FOR ANY PURPOSE, NOTIFY LAB WITHIN 5 DAYS.  LOWEST DETECTABLE LIMITS FOR URINE DRUG SCREEN Drug Class                     Cutoff (ng/mL) Amphetamine and metabolites    1000 Barbiturate and metabolites    200 Benzodiazepine                 200 Tricyclics and metabolites     300 Opiates and metabolites        300 Cocaine and metabolites        300 THC                            50 Performed at [facility], [address] [place], [place] [address]   Lipase, blood     Status: None   Collection Time: 02/22/21  9:44 PM  Result Value Ref Range   Lipase 24 11 - 51 U/L    Comment: Performed at [facility], [address], [place], [place] [address]  Comprehensive metabolic panel     Status: Abnormal   Collection Time: 02/22/21  9:44 PM  Result Value Ref Range   Sodium 137 135 - 145 mmol/L   Potassium 3.3 (L) 3.5 - 5.1 mmol/L   Chloride 99 98 - 111 mmol/L   CO2 26 22 - 32 mmol/L   Glucose, Bld 120 (H) 70 - 99 mg/dL    Comment: Glucose reference range applies only to samples taken after fasting for at least 8 hours.   BUN 15 8 - 23 mg/dL   Creatinine, Ser 1.51 (H) 0.44 - 1.00 mg/dL   Calcium 9.4 8.9 - 76.1 mg/dL   Total Protein 8.3 (H) 6.5 - 8.1 g/dL   Albumin 4.2 3.5 - 5.0 g/dL   AST 19 15 - 41 U/L   ALT 20 0 - 44 U/L   Alkaline Phosphatase 92 38 - 126 U/L   Total Bilirubin 1.8 (H) 0.3 - 1.2 mg/dL  GFR, Estimated 52 (L) >60 mL/min    Comment: (NOTE) Calculated using the CKD-EPI Creatinine Equation (2021)    Anion gap 12 5 - 15    Comment:  Performed at [facility], [address] [place], [place] [address]  CBC     Status: Abnormal   Collection Time: 02/22/21  9:44 PM  Result Value Ref Range   WBC 25.8 (H) 4.0 - 10.5 K/uL   RBC 6.81 (H) 3.87 - 5.11 MIL/uL   Hemoglobin 15.1 (H) 12.0 - 15.0 g/dL   HCT 81.146.5 (H) 91.436.0 - 78.246.0 %   MCV 68.3 (L) 80.0 - 100.0 fL   MCH 22.2 (L) 26.0 - 34.0 pg   MCHC 32.5 30.0 - 36.0 g/dL   RDW 95.618.9 (H) 21.311.5 - 08.615.5 %   Platelets 283 150 - 400 K/uL   nRBC 0.0 0.0 - 0.2 %    Comment: Performed at [facility], [address] [place], [place] [address]  Resp Panel by RT-PCR (Flu A&B, Covid) Nasopharyngeal Swab     Status: None   Collection Time: 02/23/21 12:23 AM   Specimen: Nasopharyngeal Swab; Nasopharyngeal(NP) swabs in vial transport medium  Result Value Ref Range   SARS Coronavirus 2 by RT PCR NEGATIVE NEGATIVE    Comment: (NOTE) SARS-CoV-2 target nucleic acids are NOT DETECTED.  The SARS-CoV-2 RNA is generally detectable in upper respiratory specimens during the acute phase of infection. The lowest concentration of SARS-CoV-2 viral copies this assay can detect is 138 copies/mL. A negative result does not preclude SARS-Cov-2 infection and should not be used as the sole basis for treatment or other patient management decisions. A negative result may occur with  improper specimen collection/handling, submission of specimen other than nasopharyngeal swab, presence of viral mutation(s) within the areas targeted by this assay, and inadequate number of viral copies(<138 copies/mL). A negative result must be combined with clinical observations, patient history, and epidemiological information. The expected result is Negative.  Fact Sheet for Patients:  BloggerCourse.comhttps://www.fda.gov/media/152166/download  Fact Sheet for Healthcare Providers:  SeriousBroker.ithttps://www.fda.gov/media/152162/download  This test is no t yet approved or cleared by the [place] FDA and  has been authorized for detection and/or  diagnosis of SARS-CoV-2 by FDA under an Emergency Use Authorization (EUA). This EUA will remain  in effect (meaning this test can be used) for the duration of the COVID-19 declaration under Section 564(b)(1) of the Act, 21 U.S.C.section 360bbb-3(b)(1), unless the authorization is terminated  or revoked sooner.       Influenza A by PCR NEGATIVE NEGATIVE   Influenza B by PCR NEGATIVE NEGATIVE    Comment: (NOTE) The Xpert Xpress SARS-CoV-2/FLU/RSV plus assay is intended as an aid in the diagnosis of influenza from Nasopharyngeal swab specimens and should not be used as a sole basis for treatment. Nasal washings and aspirates are unacceptable for Xpert Xpress SARS-CoV-2/FLU/RSV testing.  Fact Sheet for Patients: BloggerCourse.comhttps://www.fda.gov/media/152166/download  Fact Sheet for Healthcare Providers: SeriousBroker.ithttps://www.fda.gov/media/152162/download  This test is not yet approved or cleared by the [place] FDA and has been authorized for detection and/or diagnosis of SARS-CoV-2 by FDA under an Emergency Use Authorization (EUA). This EUA will remain in effect (meaning this test can be used) for the duration of the COVID-19 declaration under Section 564(b)(1) of the Act, 21 U.S.C. section 360bbb-3(b)(1), unless the authorization is terminated or revoked.  Performed at [facility], [address] [place], [place] [address]   CBC     Status: Abnormal   Collection Time: 02/23/21  4:33  AM  Result Value Ref Range   WBC 23.0 (H) 4.0 - 10.5 K/uL   RBC 6.23 (H) 3.87 - 5.11 MIL/uL   Hemoglobin 13.7 12.0 - 15.0 g/dL   HCT 09.3 23.5 - 57.3 %   MCV 69.0 (L) 80.0 - 100.0 fL   MCH 22.0 (L) 26.0 - 34.0 pg   MCHC 31.9 30.0 - 36.0 g/dL   RDW 22.0 (H) 25.4 - 27.0 %   Platelets 230 150 - 400 K/uL   nRBC 0.0 0.0 - 0.2 %    Comment: Performed at [facility], [address], [place], [place] [address]  Comprehensive metabolic panel     Status: Abnormal   Collection Time: 02/23/21  4:33 AM  Result Value Ref  Range   Sodium 138 135 - 145 mmol/L   Potassium 3.9 3.5 - 5.1 mmol/L   Chloride 99 98 - 111 mmol/L   CO2 27 22 - 32 mmol/L   Glucose, Bld 98 70 - 99 mg/dL    Comment: Glucose reference range applies only to samples taken after fasting for at least 8 hours.   BUN 16 8 - 23 mg/dL   Creatinine, Ser 2.83 (H) 0.44 - 1.00 mg/dL   Calcium 8.5 (L) 8.9 - 10.3 mg/dL   Total Protein 7.4 6.5 - 8.1 g/dL   Albumin 3.4 (L) 3.5 - 5.0 g/dL   AST 18 15 - 41 U/L   ALT 15 0 - 44 U/L   Alkaline Phosphatase 75 38 - 126 U/L   Total Bilirubin 1.2 0.3 - 1.2 mg/dL   GFR, Estimated 54 (L) >60 mL/min    Comment: (NOTE) Calculated using the CKD-EPI Creatinine Equation (2021)    Anion gap 12 5 - 15    Comment: Performed at [facility], [address] [place], [place] [address]    CT ABDOMEN PELVIS W CONTRAST  Result Date: 02/23/2021 CLINICAL DATA:  Right lower quadrant pain EXAM: CT ABDOMEN AND PELVIS WITH CONTRAST TECHNIQUE: Multidetector CT imaging of the abdomen and pelvis was performed using the standard protocol following bolus administration of intravenous contrast. CONTRAST:  82mL OMNIPAQUE IOHEXOL 350 MG/ML SOLN COMPARISON:  09/04/2020 FINDINGS: Lower chest: Linear scarring or atelectasis in the lung bases. No effusions. Hepatobiliary: Gallbladder is distended with thick walls, pericholecystic fluid and stranding compatible with cholecystitis. Small hypodensity within the liver likely reflect small cyst. No suspicious focal hepatic abnormality. Pancreas: No focal abnormality or ductal dilatation. Spleen: No focal abnormality.  Normal size. Adrenals/Urinary Tract: Small scattered cortical cysts. No hydronephrosis. Adrenal glands and urinary bladder unremarkable. Stomach/Bowel: Stomach, large and small bowel grossly unremarkable. Vascular/Lymphatic: No evidence of aneurysm or adenopathy. Reproductive: Prior hysterectomy.  No adnexal masses. Other: No free fluid or free air. Musculoskeletal: No acute bony  abnormality. IMPRESSION: Distended gallbladder with wall thickening, pericholecystic fluid and significant surrounding stranding compatible with acute cholecystitis. Electronically Signed   By: Charlett Nose M.D.   On: 02/23/2021 01:53    ROS:  Pertinent items noted in HPI and remainder of comprehensive ROS otherwise negative.  Blood pressure 133/78, pulse 88, temperature 98.6 F (37 C), temperature source Oral, resp. rate 20, height 5\' 7"  (1.702 m), weight 102.1 kg, SpO2 98 %. Physical Exam: Constitutional: laying in bed with occasional shouts and grimaces because of abdominal pain HENT: normocephalic atraumatic Eyes: pupils equal and round Neck: supple Cardiovascular: regular rate and rhythm, no m/r/g Pulmonary/Chest: normal work of breathing on room air, lungs clear to auscultation bilaterally Abdominal: soft, non-distended, bowel sounds  present, diffuse tenderness to palpation along the right sided abdomen with specific tenderness in the RUQ, localized rebound tenderness in the RUQ, no guarding MSK: normal bulk and tone Neurological: alert and answering questions appropriately Skin: warm and dry Psych: appropriate mood and affect   Assessment/Plan: Patient presents with signs and symptoms present in history and on physical exam concerning for acute cholecystitis, and this is supported by CT findings showing a distended gallbladder with wall thickening, pericholecystic fluid and stranding consistent with acute cholecystitis. WBC 23.0 down from 25.8 yesterday and patient is currently afebrile. WBC elevation in the setting of acute cholecystitis, recent cocaine use, and UTI. RUQ ultrasound pending at this time. Further management with respect to surgical intervention will be informed by ultrasound results. -Continue pain management:  -acetaminophen 650 mg q6 hrs prn for mild pain  -oxycodone 5 mg q4 hrs prn for moderate pain  -morphine 2 mg q2 hrs prn for severe pain -Continue  heparin -Continue antibiotics -Suspecting patient will need surgery but will likely aim for Friday given recent cocaine use  Val Eagle 02/23/2021, 8:00 AM

## 2021-02-23 NOTE — Progress Notes (Signed)
Patient admitted for acute cholecystitis.  UDS is positive for cocaine.  General surgery consulted.  Patient seen and examined at bedside, abdominal pain is improved.  Vital signs are stable.  Abdomen is nontender nondistended.  We will place the patient on fluids, clear liquid diet.  Seems like her surgery is postponed until Friday 9/9.  Accu-Cheks every 6 hours, pain control.  Stephania Fragmin MD [facility]

## 2021-02-23 NOTE — H&P (Signed)
TRH H&P    Patient Demographics:    Vicki Dean, is a 60 y.o. female  MRN: [id number]  DOB - [birth year]  Admit Date - 02/22/2021  Referring MD/NP/PA: Preston Fleeting  Outpatient Primary MD for the patient is [facility]  Patient coming from: Home  Chief complaint-abdominal pain   HPI:    Vicki Dean  is a 60 y.o. female, with history of substance abuse, COPD, GERD, hypertension, anxiety, and more presents the ED with a chief complaint of abdominal pain.  She reports that she has right side and stomach pain that started 2 days ago.  It is worse since it started.  She has had episodes of emesis that are nonbloody.  She reports hematuria at home however UA is negative for hemoglobin here.  She reports that eating makes the pain worse.  Her last meal was day before yesterday.  She has associated fever and chills.  She has not taken any medication for it.  She denies any diarrhea.  She is never had pain like this before.  Patient current smoker, drinks a couple times a week usually wine cooler, and uses cocaine.  Her last use of cocaine was 5 days ago.  She is vaccinated for COVID.  She is full code.  In the ED Temp 98.6-103 in triage, heart rate 81-86, respiratory rate 19, blood pressure 134/81, satting at 94% Leukocytosis with a white blood cell count of 26, hemoglobin 15.1 Chemistry panel reveals hypokalemia at 3.3, slightly elevated creatinine at 1.2 Negative respiratory panel UA shows signs of dehydration CT abdomen shows distended gallbladder, with wall thickening, pericholecystic fluid, significant surrounding stranding compatible with acute cholecystitis Rocephin, 1 L LR, morphine 4 mg x 2, Zofran given in the ED General surgery consulted and reports medicine admit they will see in the a.m.   Review of systems:    In addition to the HPI above,  Admits to fever and chills No Headache, No changes  with Vision or hearing, No problems swallowing food or Liquids, No Chest pain, Cough or Shortness of Breath, Admits to abdominal pain as described above  Admits to hematuria but no melena or hematochezia No dysuria, No new skin rashes or bruises, No new joints pains-aches,  No new weakness, tingling, numbness in any extremity, No recent weight gain or loss, No polyuria, polydypsia or polyphagia, No significant Mental Stressors.  All other systems reviewed and are negative.    Past History of the following :    Past Medical History:  Diagnosis Date   Anxiety    Arthritis    possibly her right shoulder.   Chronic back pain    COPD (chronic obstructive pulmonary disease) (HCC)    Fibromyalgia    GERD (gastroesophageal reflux disease)    Hypertension    Sleep apnea    could not tolerate      Past Surgical History:  Procedure Laterality Date   ABDOMINAL HYSTERECTOMY     CARPAL TUNNEL RELEASE Left    COLONOSCOPY WITH PROPOFOL N/A 08/23/2017   Dr. Jena Gauss,  grade II hemorrhoids. next TCS in 10 years.    ESOPHAGOGASTRODUODENOSCOPY (EGD) WITH PROPOFOL N/A 08/23/2017   Dr. Jena Gauss: mild erosive reflux esophagitis s/p dilation due to h/o dysphagia.    FOOT SURGERY Right    screws from fracture   GANGLION CYST EXCISION Right    foot   KNEE SURGERY Right    arthroscopy   MALONEY DILATION N/A 08/23/2017   Procedure: Elease Hashimoto DILATION;  Surgeon: Corbin Ade, MD;  Location: [facility];  Service: Endoscopy;  Laterality: N/A;      Social History:      Social History   Tobacco Use   Smoking status: Former    Packs/day: 1.00    Years: 20.00    Pack years: 20.00    Types: Cigarettes    Quit date: 08/20/2013    Years since quitting: 7.5   Smokeless tobacco: Never  Substance Use Topics   Alcohol use: Not Currently    Comment: in the past       Family History :     Family History  Problem Relation Age of Onset   Hypertension Mother    Diabetes Mother    Arthritis  Mother    Hypercholesterolemia Mother    Emphysema Father    Cancer Other    Diabetes Other    Colon cancer Neg Hx    Gastric cancer Neg Hx    Esophageal cancer Neg Hx       Home Medications:   Prior to Admission medications   Medication Sig Start Date End Date Taking? Authorizing Provider  albuterol (PROVENTIL HFA;VENTOLIN HFA) 108 (90 Base) MCG/ACT inhaler Inhale 1-2 puffs into the lungs every 6 (six) hours as needed for wheezing or shortness of breath.    [provider]  amLODipine (NORVASC) 10 MG tablet Take 10 mg by mouth daily.    [provider]  budesonide-formoterol (SYMBICORT) 160-4.5 MCG/ACT inhaler Inhale 2 puffs into the lungs 2 (two) times daily.    [provider]  cephALEXin (KEFLEX) 500 MG capsule Take 1 capsule (500 mg total) by mouth 4 (four) times daily. Patient not taking: No sig reported 08/12/19   Terrilee Files, MD  Cholecalciferol 25 MCG (1000 UT) tablet Take 2 tablets by mouth daily. 06/08/20   [provider]  ferrous sulfate 325 (65 FE) MG tablet Take 325 mg by mouth daily.    [provider]  gabapentin (NEURONTIN) 800 MG tablet Take 400 mg by mouth in the morning, at noon, and at bedtime. 06/08/20   [provider]  hydrochlorothiazide (HYDRODIURIL) 25 MG tablet Take 12.5 mg by mouth daily.     [provider]  lidocaine (LIDODERM) 5 % Place 1 patch onto the skin daily as needed (pain). 04/20/20   [provider]  Lurasidone HCl 120 MG TABS Take 0.5 tablets by mouth at bedtime. 10/25/19   [provider]  meloxicam (MOBIC) 15 MG tablet TAKE 1 TABLET BY MOUTH ONCE DAILY WITH FOOD Patient not taking: Reported on 09/04/2020 04/25/18   [provider]  naproxen (NAPROSYN) 500 MG tablet Take 1 tablet by mouth in the morning and at bedtime. 06/08/20   [provider]  phenazopyridine (PYRIDIUM) 200 MG tablet Take 1 tablet (200 mg total) by mouth 3 (three) times  daily. Patient not taking: No sig reported 08/12/19   Terrilee Files, MD  potassium chloride SA (KLOR-CON) 20 MEQ tablet Take 1 tablet (20 mEq total) by mouth 2 (two) times  daily. 09/04/20   Rancour, Jeannett Senior, MD  venlafaxine XR (EFFEXOR-XR) 150 MG 24 hr capsule Take 2 capsules by mouth daily. 06/08/20   [provider]     Allergies:     Allergies  Allergen Reactions   Doxepin     Pt unsure of reaction   Lisinopril     cough   Omeprazole     Doesn't agree with her   Pneumococcal Vaccines     Arm swelling     Physical Exam:   Vitals  Blood pressure 135/78, pulse 88, temperature 98.6 F (37 C), temperature source Oral, resp. rate 20, height  (1.702 m), weight 102.1 kg, SpO2 98 %.  1.  General: Patient lying supine in bed,  no acute distress   2. Psychiatric: Alert and oriented x 3, mood and behavior normal for situation, pleasant and cooperative with exam   3. Neurologic: Speech and language are normal, face is symmetric, moves all 4 extremities voluntarily, at baseline without acute deficits on limited exam   4. HEENMT:  Head is atraumatic, normocephalic, pupils reactive to light, neck is supple, trachea is midline, mucous membranes are moist   5. Respiratory : Lungs are clear to auscultation bilaterally without wheezing, rhonchi, rales, no cyanosis, no increase in work of breathing or accessory muscle use   6. Cardiovascular : Heart rate normal, rhythm is regular, no murmurs, rubs or gallops, no peripheral edema, peripheral pulses palpated   7. Gastrointestinal:  Abdomen is soft, minimally distended, exquisitely tender to palpation in the right upper quadrant and epigastric region, voluntary guarding, no masses or organomegaly palpated   8. Skin:  Skin is warm, dry and intact without rashes, acute lesions, or ulcers on limited exam   9.Musculoskeletal:  No acute deformities or trauma, no asymmetry in tone, no peripheral edema, peripheral pulses  palpated, no tenderness to palpation in the extremities     Data Review:    CBC Recent Labs  Lab 02/22/21 2144 02/23/21 0433  WBC 25.8* 23.0*  HGB 15.1* 13.7  HCT 46.5* 43.0  PLT 283 230  MCV 68.3* 69.0*  MCH 22.2* 22.0*  MCHC 32.5 31.9  RDW 18.9* 18.5*   ------------------------------------------------------------------------------------------------------------------  Results for orders placed or performed during the hospital encounter of 02/22/21 (from the past 48 hour(s))  Urinalysis, Routine w reflex microscopic Urine, Clean Catch     Status: Abnormal   Collection Time: 02/22/21  7:46 PM  Result Value Ref Range   Color, Urine AMBER (A) YELLOW    Comment: BIOCHEMICALS MAY BE AFFECTED BY COLOR   APPearance CLOUDY (A) CLEAR   Specific Gravity, Urine >1.030 (H) 1.005 - 1.030   pH 5.0 5.0 - 8.0   Glucose, UA 100 (A) NEGATIVE mg/dL   Hgb urine dipstick NEGATIVE NEGATIVE   Bilirubin Urine MODERATE (A) NEGATIVE   Ketones, ur 15 (A) NEGATIVE mg/dL   Protein, ur 161 (A) NEGATIVE mg/dL   Nitrite POSITIVE (A) NEGATIVE   Leukocytes,Ua TRACE (A) NEGATIVE    Comment: Performed at [facility], [address] [place], [place] [address]  Urinalysis, Microscopic (reflex)     Status: Abnormal   Collection Time: 02/22/21  7:46 PM  Result Value Ref Range   RBC / HPF 0-5 0 - 5 RBC/hpf   WBC, UA 6-10 0 - 5 WBC/hpf   Bacteria, UA FEW (A) NONE SEEN   Squamous Epithelial / LPF 0-5 0 - 5    Comment: Performed at [facility], [address],  [place], [place] [address]  Lipase, blood     Status: None   Collection Time: 02/22/21  9:44 PM  Result Value Ref Range   Lipase 24 11 - 51 U/L    Comment: Performed at [facility], [address] [place], [place] [address]  Comprehensive metabolic panel     Status: Abnormal   Collection Time: 02/22/21  9:44 PM  Result Value Ref Range   Sodium 137 135 - 145 mmol/L   Potassium 3.3 (L) 3.5 - 5.1 mmol/L   Chloride 99 98 - 111 mmol/L   CO2 26 22 -  32 mmol/L   Glucose, Bld 120 (H) 70 - 99 mg/dL    Comment: Glucose reference range applies only to samples taken after fasting for at least 8 hours.   BUN 15 8 - 23 mg/dL   Creatinine, Ser 0.98 (H) 0.44 - 1.00 mg/dL   Calcium 9.4 8.9 - 11.9 mg/dL   Total Protein 8.3 (H) 6.5 - 8.1 g/dL   Albumin 4.2 3.5 - 5.0 g/dL   AST 19 15 - 41 U/L   ALT 20 0 - 44 U/L   Alkaline Phosphatase 92 38 - 126 U/L   Total Bilirubin 1.8 (H) 0.3 - 1.2 mg/dL   GFR, Estimated 52 (L) >60 mL/min    Comment: (NOTE) Calculated using the CKD-EPI Creatinine Equation (2021)    Anion gap 12 5 - 15    Comment: Performed at [facility], [address] [place], [place] [address]  CBC     Status: Abnormal   Collection Time: 02/22/21  9:44 PM  Result Value Ref Range   WBC 25.8 (H) 4.0 - 10.5 K/uL   RBC 6.81 (H) 3.87 - 5.11 MIL/uL   Hemoglobin 15.1 (H) 12.0 - 15.0 g/dL   HCT 95.6 (H) 21.3 - 08.6 %   MCV 68.3 (L) 80.0 - 100.0 fL   MCH 22.2 (L) 26.0 - 34.0 pg   MCHC 32.5 30.0 - 36.0 g/dL   RDW 57.8 (H) 46.9 - 62.9 %   Platelets 283 150 - 400 K/uL   nRBC 0.0 0.0 - 0.2 %    Comment: Performed at [facility], [address], [place], [place] [address]  Resp Panel by RT-PCR (Flu A&B, Covid) Nasopharyngeal Swab     Status: None   Collection Time: 02/23/21 12:23 AM   Specimen: Nasopharyngeal Swab; Nasopharyngeal(NP) swabs in vial transport medium  Result Value Ref Range   SARS Coronavirus 2 by RT PCR NEGATIVE NEGATIVE    Comment: (NOTE) SARS-CoV-2 target nucleic acids are NOT DETECTED.  The SARS-CoV-2 RNA is generally detectable in upper respiratory specimens during the acute phase of infection. The lowest concentration of SARS-CoV-2 viral copies this assay can detect is 138 copies/mL. A negative result does not preclude SARS-Cov-2 infection and should not be used as the sole basis for treatment or other patient management decisions. A negative result may occur with  improper specimen collection/handling, submission  of specimen other than nasopharyngeal swab, presence of viral mutation(s) within the areas targeted by this assay, and inadequate number of viral copies(<138 copies/mL). A negative result must be combined with clinical observations, patient history, and epidemiological information. The expected result is Negative.  Fact Sheet for Patients:  BloggerCourse.com  Fact Sheet for Healthcare Providers:  SeriousBroker.it  This test is no t yet approved or cleared by the [place] FDA and  has been authorized for detection and/or diagnosis of SARS-CoV-2 by FDA under an Emergency Use Authorization (EUA).  This EUA will remain  in effect (meaning this test can be used) for the duration of the COVID-19 declaration under Section 564(b)(1) of the Act, 21 U.S.C.section 360bbb-3(b)(1), unless the authorization is terminated  or revoked sooner.       Influenza A by PCR NEGATIVE NEGATIVE   Influenza B by PCR NEGATIVE NEGATIVE    Comment: (NOTE) The Xpert Xpress SARS-CoV-2/FLU/RSV plus assay is intended as an aid in the diagnosis of influenza from Nasopharyngeal swab specimens and should not be used as a sole basis for treatment. Nasal washings and aspirates are unacceptable for Xpert Xpress SARS-CoV-2/FLU/RSV testing.  Fact Sheet for Patients: BloggerCourse.comhttps://www.fda.gov/media/152166/download  Fact Sheet for Healthcare Providers: SeriousBroker.ithttps://www.fda.gov/media/152162/download  This test is not yet approved or cleared by the [place] FDA and has been authorized for detection and/or diagnosis of SARS-CoV-2 by FDA under an Emergency Use Authorization (EUA). This EUA will remain in effect (meaning this test can be used) for the duration of the COVID-19 declaration under Section 564(b)(1) of the Act, 21 U.S.C. section 360bbb-3(b)(1), unless the authorization is terminated or revoked.  Performed at [facility], [address] [place], [place]  [address]   CBC     Status: Abnormal   Collection Time: 02/23/21  4:33 AM  Result Value Ref Range   WBC 23.0 (H) 4.0 - 10.5 K/uL   RBC 6.23 (H) 3.87 - 5.11 MIL/uL   Hemoglobin 13.7 12.0 - 15.0 g/dL   HCT 60.443.0 54.036.0 - 98.146.0 %   MCV 69.0 (L) 80.0 - 100.0 fL   MCH 22.0 (L) 26.0 - 34.0 pg   MCHC 31.9 30.0 - 36.0 g/dL   RDW 19.118.5 (H) 47.811.5 - 29.515.5 %   Platelets 230 150 - 400 K/uL   nRBC 0.0 0.0 - 0.2 %    Comment: Performed at [facility], [address] [place], [place] [address]  Comprehensive metabolic panel     Status: Abnormal   Collection Time: 02/23/21  4:33 AM  Result Value Ref Range   Sodium 138 135 - 145 mmol/L   Potassium 3.9 3.5 - 5.1 mmol/L   Chloride 99 98 - 111 mmol/L   CO2 27 22 - 32 mmol/L   Glucose, Bld 98 70 - 99 mg/dL    Comment: Glucose reference range applies only to samples taken after fasting for at least 8 hours.   BUN 16 8 - 23 mg/dL   Creatinine, Ser 8.651.15 (H) 0.44 - 1.00 mg/dL   Calcium 8.5 (L) 8.9 - 10.3 mg/dL   Total Protein 7.4 6.5 - 8.1 g/dL   Albumin 3.4 (L) 3.5 - 5.0 g/dL   AST 18 15 - 41 U/L   ALT 15 0 - 44 U/L   Alkaline Phosphatase 75 38 - 126 U/L   Total Bilirubin 1.2 0.3 - 1.2 mg/dL   GFR, Estimated 54 (L) >60 mL/min    Comment: (NOTE) Calculated using the CKD-EPI Creatinine Equation (2021)    Anion gap 12 5 - 15    Comment: Performed at [facility], [address] [place], [place] [address]    Chemistries  Recent Labs  Lab 02/22/21 2144 02/23/21 0433  NA 137 138  K 3.3* 3.9  CL 99 99  CO2 26 27  GLUCOSE 120* 98  BUN 15 16  CREATININE 1.20* 1.15*  CALCIUM 9.4 8.5*  AST 19 18  ALT 20 15  ALKPHOS 92 75  BILITOT 1.8* 1.2   ------------------------------------------------------------------------------------------------------------------  ------------------------------------------------------------------------------------------------------------------ GFR: Estimated Creatinine Clearance: 63.1 mL/min (A) (  by C-G formula based on SCr  of 1.15 mg/dL (H)). Liver Function Tests: Recent Labs  Lab 02/22/21 2144 02/23/21 0433  AST 19 18  ALT 20 15  ALKPHOS 92 75  BILITOT 1.8* 1.2  PROT 8.3* 7.4  ALBUMIN 4.2 3.4*   Recent Labs  Lab 02/22/21 2144  LIPASE 24   No results for input(s): AMMONIA in the last 168 hours. Coagulation Profile: No results for input(s): INR, PROTIME in the last 168 hours. Cardiac Enzymes: No results for input(s): CKTOTAL, CKMB, CKMBINDEX, TROPONINI in the last 168 hours. BNP (last 3 results) No results for input(s): PROBNP in the last 8760 hours. HbA1C: No results for input(s): HGBA1C in the last 72 hours. CBG: No results for input(s): GLUCAP in the last 168 hours. Lipid Profile: No results for input(s): CHOL, HDL, LDLCALC, TRIG, CHOLHDL, LDLDIRECT in the last 72 hours. Thyroid Function Tests: No results for input(s): TSH, T4TOTAL, FREET4, T3FREE, THYROIDAB in the last 72 hours. Anemia Panel: No results for input(s): VITAMINB12, FOLATE, FERRITIN, TIBC, IRON, RETICCTPCT in the last 72 hours.  --------------------------------------------------------------------------------------------------------------- Urine analysis:    Component Value Date/Time   COLORURINE AMBER (A) 02/22/2021 1946   APPEARANCEUR CLOUDY (A) 02/22/2021 1946   LABSPEC >1.030 (H) 02/22/2021 1946   PHURINE 5.0 02/22/2021 1946   GLUCOSEU 100 (A) 02/22/2021 1946   HGBUR NEGATIVE 02/22/2021 1946   BILIRUBINUR MODERATE (A) 02/22/2021 1946   KETONESUR 15 (A) 02/22/2021 1946   PROTEINUR 100 (A) 02/22/2021 1946   UROBILINOGEN 0.2 03/15/2010 2131   NITRITE POSITIVE (A) 02/22/2021 1946   LEUKOCYTESUR TRACE (A) 02/22/2021 1946      Imaging Results:    CT ABDOMEN PELVIS W CONTRAST  Result Date: 02/23/2021 CLINICAL DATA:  Right lower quadrant pain EXAM: CT ABDOMEN AND PELVIS WITH CONTRAST TECHNIQUE: Multidetector CT imaging of the abdomen and pelvis was performed using the standard protocol following bolus administration  of intravenous contrast. CONTRAST:  40mL OMNIPAQUE IOHEXOL 350 MG/ML SOLN COMPARISON:  09/04/2020 FINDINGS: Lower chest: Linear scarring or atelectasis in the lung bases. No effusions. Hepatobiliary: Gallbladder is distended with thick walls, pericholecystic fluid and stranding compatible with cholecystitis. Small hypodensity within the liver likely reflect small cyst. No suspicious focal hepatic abnormality. Pancreas: No focal abnormality or ductal dilatation. Spleen: No focal abnormality.  Normal size. Adrenals/Urinary Tract: Small scattered cortical cysts. No hydronephrosis. Adrenal glands and urinary bladder unremarkable. Stomach/Bowel: Stomach, large and small bowel grossly unremarkable. Vascular/Lymphatic: No evidence of aneurysm or adenopathy. Reproductive: Prior hysterectomy.  No adnexal masses. Other: No free fluid or free air. Musculoskeletal: No acute bony abnormality. IMPRESSION: Distended gallbladder with wall thickening, pericholecystic fluid and significant surrounding stranding compatible with acute cholecystitis. Electronically Signed   By: Charlett Nose M.D.   On: 02/23/2021 01:53     Assessment & Plan:    Principal Problem:   Acute cholecystitis Active Problems:   Essential hypertension   AKI (acute kidney injury) (HCC)   Hypokalemia   Acute cholecystitis Rocephin started in the ED Continue Zosyn Treat fever with Tylenol N.p.o. General surgery to see this a.m. Hypokalemia Replace and recheck Hypertension Continue amlodipine Hold hydrochlorothiazide in the setting of hypokalemia AKI Baseline creatinine 0.7, today 1.20 Likely secondary to poor p.o. intake Continue fluids Continue to avoid nephrotoxic agents when possible Substance abuse Last cocaine use 5 days ago Counseled on the importance of cessation UDS pending   DVT Prophylaxis-   Heparin- SCDs   AM Labs Ordered, also please review Full Orders  Family Communication:  No family at bedside  Code Status:  Full  Admission status: Inpatient :The appropriate admission status for this patient is INPATIENT. Inpatient status is judged to be reasonable and necessary in order to provide the required intensity of service to ensure the patient's safety. The patient's presenting symptoms, physical exam findings, and initial radiographic and laboratory data in the context of their chronic comorbidities is felt to place them at high risk for further clinical deterioration. Furthermore, it is not anticipated that the patient will be medically stable for discharge from the hospital within 2 midnights of admission. The following factors support the admission status of inpatient.     The patient's presenting symptoms include abdominal pain. The worrisome physical exam findings include abdominal tenderness with guarding and fever. The initial radiographic and laboratory data are worrisome because of leukocytosis and acute cholecystitis. The chronic co-morbidities include hypertension and substance abuse.       * I certify that at the point of admission it is my clinical judgment that the patient will require inpatient hospital care spanning beyond 2 midnights from the point of admission due to high intensity of service, high risk for further deterioration and high frequency of surveillance required.*  Time spent in minutes : 65   Temperence Zenor B Zierle-Ghosh DO

## 2021-02-24 LAB — CBC
HCT: 36.7 % (ref 36.0–46.0)
HCT: 37.3 % (ref 36.0–46.0)
Hemoglobin: 11.5 g/dL — ABNORMAL LOW (ref 12.0–15.0)
Hemoglobin: 11.7 g/dL — ABNORMAL LOW (ref 12.0–15.0)
MCH: 21.8 pg — ABNORMAL LOW (ref 26.0–34.0)
MCH: 21.9 pg — ABNORMAL LOW (ref 26.0–34.0)
MCHC: 31.3 g/dL (ref 30.0–36.0)
MCHC: 31.4 g/dL (ref 30.0–36.0)
MCV: 69.5 fL — ABNORMAL LOW (ref 80.0–100.0)
MCV: 69.7 fL — ABNORMAL LOW (ref 80.0–100.0)
Platelets: 194 10*3/uL (ref 150–400)
Platelets: 195 10*3/uL (ref 150–400)
RBC: 5.28 MIL/uL — ABNORMAL HIGH (ref 3.87–5.11)
RBC: 5.35 MIL/uL — ABNORMAL HIGH (ref 3.87–5.11)
RDW: 17 % — ABNORMAL HIGH (ref 11.5–15.5)
RDW: 17.2 % — ABNORMAL HIGH (ref 11.5–15.5)
WBC: 21 10*3/uL — ABNORMAL HIGH (ref 4.0–10.5)
WBC: 21.8 10*3/uL — ABNORMAL HIGH (ref 4.0–10.5)
nRBC: 0 % (ref 0.0–0.2)
nRBC: 0 % (ref 0.0–0.2)

## 2021-02-24 LAB — COMPREHENSIVE METABOLIC PANEL
ALT: 14 U/L (ref 0–44)
AST: 17 U/L (ref 15–41)
Albumin: 2.7 g/dL — ABNORMAL LOW (ref 3.5–5.0)
Alkaline Phosphatase: 88 U/L (ref 38–126)
Anion gap: 12 (ref 5–15)
BUN: 17 mg/dL (ref 8–23)
CO2: 25 mmol/L (ref 22–32)
Calcium: 8.3 mg/dL — ABNORMAL LOW (ref 8.9–10.3)
Chloride: 99 mmol/L (ref 98–111)
Creatinine, Ser: 0.89 mg/dL (ref 0.44–1.00)
GFR, Estimated: >60 mL/min (ref >60)
Glucose, Bld: 84 mg/dL (ref 70–99)
Potassium: 3.3 mmol/L — ABNORMAL LOW (ref 3.5–5.1)
Sodium: 136 mmol/L (ref 135–145)
Total Bilirubin: 1.2 mg/dL (ref 0.3–1.2)
Total Protein: 6.4 g/dL — ABNORMAL LOW (ref 6.5–8.1)

## 2021-02-24 LAB — GLUCOSE, CAPILLARY
Glucose-Capillary: 92 mg/dL (ref 70–99)
Glucose-Capillary: 95 mg/dL (ref 70–99)
Glucose-Capillary: 120 mg/dL — ABNORMAL HIGH (ref 70–99)
Glucose-Capillary: 80 mg/dL (ref 70–99)

## 2021-02-24 LAB — MAGNESIUM: Magnesium: 1.9 mg/dL (ref 1.7–2.4)

## 2021-02-24 MED ORDER — POTASSIUM CHLORIDE 20 MEQ PO PACK
40.0000 meq | PACK | ORAL | Status: AC
  Administered 2021-02-24 (×2): 40 meq via ORAL
  Filled 2021-02-24 (×2): qty 2

## 2021-02-24 MED ORDER — POLYETHYLENE GLYCOL 3350 17 G PO PACK
17.0000 g | PACK | Freq: Every day | ORAL | Status: DC | PRN
  Filled 2021-02-24: qty 1

## 2021-02-24 MED ORDER — CHLORHEXIDINE GLUCONATE CLOTH 2 % EX PADS
6.0000 | MEDICATED_PAD | Freq: Once | CUTANEOUS | Status: AC
  Administered 2021-02-24: 6 via TOPICAL

## 2021-02-24 MED ORDER — SENNOSIDES-DOCUSATE SODIUM 8.6-50 MG PO TABS: 1.0000 | ORAL_TABLET | Freq: Every evening | ORAL | Status: DC | PRN

## 2021-02-24 MED ORDER — CHLORHEXIDINE GLUCONATE CLOTH 2 % EX PADS
6.0000 | MEDICATED_PAD | Freq: Once | CUTANEOUS | Status: AC
  Administered 2021-02-25: 6 via TOPICAL

## 2021-02-24 NOTE — Progress Notes (Signed)
PROGRESS NOTE    Vicki Dean  MPN:[id number] DOB: Feb 29, 1961 DOA: 02/22/2021 PCP: [facility]   Brief Narrative:   60 year old with history of substance abuse, HTN, anxiety comes to the hospital with complains of abdominal pain.  She was diagnosed with acute cholecystitis but her UDS was positive for cocaine.  General surgery recommended laparoscopic cholecystostomy on 9/9 after allowing cocaine washout.  Assessment & Plan:   Principal Problem:   Acute cholecystitis Active Problems:   Essential hypertension   AKI (acute kidney injury) (HCC)   Hypokalemia   Acute cholecystitis - Continue empiric IV Zosyn.  WBC slowly improving.  Clear liquid diet today, n.p.o. past midnight for laparoscopic cholecystectomy tomorrow.  General surgery following.  Acute kidney injury - Baseline creatinine 0.8.  Admission creatinine 1.2 improved with IV fluids.  Microcytic anemia - Check iron studies  Essential hypertension - On amlodipine  Polysubstance abuse - Positive for cocaine and THC.  Counseled to quit using this    DVT prophylaxis: heparin injection 5,000 Units Start: 02/23/21 0600 SCDs Start: 02/23/21 0353 Code Status: Full code Family Communication:    Status is: Inpatient  Remains inpatient appropriate because:Inpatient level of care appropriate due to severity of illness  Dispo: The patient is from: Home              Anticipated d/c is to: Home              Patient currently is not medically stable to d/c.  Plans for cholecystectomy tomorrow   Difficult to place patient No     Subjective: Still has some right upper quadrant abdominal pain but no other complaints.  Review of Systems Otherwise negative except as per HPI, including: General: Denies fever, chills, night sweats or unintended weight loss. Resp: Denies cough, wheezing, shortness of breath. Cardiac: Denies chest pain, palpitations, orthopnea, paroxysmal nocturnal dyspnea. GI: Denies  abdominal pain, nausea, vomiting, diarrhea or constipation GU: Denies dysuria, frequency, hesitancy or incontinence MS: Denies muscle aches, joint pain or swelling Neuro: Denies headache, neurologic deficits (focal weakness, numbness, tingling), abnormal gait Psych: Denies anxiety, depression, SI/HI/AVH Skin: Denies new rashes or lesions ID: Denies sick contacts, exotic exposures, travel  Examination:  General exam: Appears calm and comfortable  Respiratory system: Clear to auscultation. Respiratory effort normal. Cardiovascular system: S1 & S2 heard, RRR. No JVD, murmurs, rubs, gallops or clicks. No pedal edema. Gastrointestinal system: Abdomen is nondistended, soft and nontender. No organomegaly or masses felt. Normal bowel sounds heard. Central nervous system: Alert and oriented. No focal neurological deficits. Extremities: Symmetric 5 x 5 power. Skin: No rashes, lesions or ulcers Psychiatry: Judgement and insight appear normal. Mood & affect appropriate.     Objective: Vitals:   02/23/21 2021 02/24/21 0358 02/24/21 0757 02/24/21 0921  BP: (!) 111/57 112/60  114/65  Pulse: (!) 55 100    Resp: 20 20    Temp: 99 F (37.2 C) 98.6 F (37 C)    TempSrc: Oral Oral    SpO2: 93% 92% 94%   Weight:      Height:        Intake/Output Summary (Last 24 hours) at 02/24/2021 1156 Last data filed at 02/24/2021 0900 Gross per 24 hour  Intake 1249.42 ml  Output --  Net 1249.42 ml   Filed Weights   02/22/21 1944  Weight: 102.1 kg     Data Reviewed:   CBC: Recent Labs  Lab 02/22/21 2144 02/23/21 0433 02/23/21 2342 02/24/21 0602  WBC  25.8* 23.0* 21.8* 21.0*  HGB 15.1* 13.7 11.5* 11.7*  HCT 46.5* 43.0 36.7 37.3  MCV 68.3* 69.0* 69.5* 69.7*  PLT 283 230 194 195   Basic Metabolic Panel: Recent Labs  Lab 02/22/21 2144 02/23/21 0433 02/24/21 0602  NA 137 138 136  K 3.3* 3.9 3.3*  CL 99 99 99  CO2 26 27 25   GLUCOSE 120* 98 84  BUN 15 16 17   CREATININE 1.20* 1.15* 0.89   CALCIUM 9.4 8.5* 8.3*  MG  --   --  1.9   GFR: Estimated Creatinine Clearance: 81.5 mL/min (by C-G formula based on SCr of 0.89 mg/dL). Liver Function Tests: Recent Labs  Lab 02/22/21 2144 02/23/21 0433 02/24/21 0602  AST 19 18 17   ALT 20 15 14   ALKPHOS 92 75 88  BILITOT 1.8* 1.2 1.2  PROT 8.3* 7.4 6.4*  ALBUMIN 4.2 3.4* 2.7*   Recent Labs  Lab 02/22/21 2144  LIPASE 24   No results for input(s): AMMONIA in the last 168 hours. Coagulation Profile: No results for input(s): INR, PROTIME in the last 168 hours. Cardiac Enzymes: No results for input(s): CKTOTAL, CKMB, CKMBINDEX, TROPONINI in the last 168 hours. BNP (last 3 results) No results for input(s): PROBNP in the last 8760 hours. HbA1C: No results for input(s): HGBA1C in the last 72 hours. CBG: Recent Labs  Lab 02/23/21 1842 02/23/21 2314 02/24/21 0558 02/24/21 0714 02/24/21 1137  GLUCAP 90 91 92 95 120*   Lipid Profile: No results for input(s): CHOL, HDL, LDLCALC, TRIG, CHOLHDL, LDLDIRECT in the last 72 hours. Thyroid Function Tests: Recent Labs    02/22/21 2146  TSH 0.443   Anemia Panel: No results for input(s): VITAMINB12, FOLATE, FERRITIN, TIBC, IRON, RETICCTPCT in the last 72 hours. Sepsis Labs: No results for input(s): PROCALCITON, LATICACIDVEN in the last 168 hours.  Recent Results (from the past 240 hour(s))  Resp Panel by RT-PCR (Flu A&B, Covid) Nasopharyngeal Swab     Status: None   Collection Time: 02/23/21 12:23 AM   Specimen: Nasopharyngeal Swab; Nasopharyngeal(NP) swabs in vial transport medium  Result Value Ref Range Status   SARS Coronavirus 2 by RT PCR NEGATIVE NEGATIVE Final    Comment: (NOTE) SARS-CoV-2 target nucleic acids are NOT DETECTED.  The SARS-CoV-2 RNA is generally detectable in upper respiratory specimens during the acute phase of infection. The lowest concentration of SARS-CoV-2 viral copies this assay can detect is 138 copies/mL. A negative result does not preclude  SARS-Cov-2 infection and should not be used as the sole basis for treatment or other patient management decisions. A negative result may occur with  improper specimen collection/handling, submission of specimen other than nasopharyngeal swab, presence of viral mutation(s) within the areas targeted by this assay, and inadequate number of viral copies(<138 copies/mL). A negative result must be combined with clinical observations, patient history, and epidemiological information. The expected result is Negative.  Fact Sheet for Patients:  04/26/21  Fact Sheet for Healthcare Providers:  04/24/21  This test is no t yet approved or cleared by the 2147 FDA and  has been authorized for detection and/or diagnosis of SARS-CoV-2 by FDA under an Emergency Use Authorization (EUA). This EUA will remain  in effect (meaning this test can be used) for the duration of the COVID-19 declaration under Section 564(b)(1) of the Act, 21 U.S.C.section 360bbb-3(b)(1), unless the authorization is terminated  or revoked sooner.       Influenza A by PCR NEGATIVE NEGATIVE Final   Influenza B  by PCR NEGATIVE NEGATIVE Final    Comment: (NOTE) The Xpert Xpress SARS-CoV-2/FLU/RSV plus assay is intended as an aid in the diagnosis of influenza from Nasopharyngeal swab specimens and should not be used as a sole basis for treatment. Nasal washings and aspirates are unacceptable for Xpert Xpress SARS-CoV-2/FLU/RSV testing.  Fact Sheet for Patients: BloggerCourse.comhttps://www.fda.gov/media/152166/download  Fact Sheet for Healthcare Providers: SeriousBroker.ithttps://www.fda.gov/media/152162/download  This test is not yet approved or cleared by the [place] FDA and has been authorized for detection and/or diagnosis of SARS-CoV-2 by FDA under an Emergency Use Authorization (EUA). This EUA will remain in effect (meaning this test can be used) for the duration of  the COVID-19 declaration under Section 564(b)(1) of the Act, 21 U.S.C. section 360bbb-3(b)(1), unless the authorization is terminated or revoked.  Performed at [facility], [address] [place], [place] [address]   Surgical PCR screen     Status: None   Collection Time: 02/23/21  6:39 PM   Specimen: Nasal Mucosa; Nasal Swab  Result Value Ref Range Status   MRSA, PCR NEGATIVE NEGATIVE Final   Staphylococcus aureus NEGATIVE NEGATIVE Final    Comment: (NOTE) The Xpert SA Assay (FDA approved for NASAL specimens in patients 61 years of age and older), is one component of a comprehensive surveillance program. It is not intended to diagnose infection nor to guide or monitor treatment. Performed at [facility], [address] [place], [place] [address]          Radiology Studies: CT ABDOMEN PELVIS W CONTRAST  Result Date: 02/23/2021 CLINICAL DATA:  Right lower quadrant pain EXAM: CT ABDOMEN AND PELVIS WITH CONTRAST TECHNIQUE: Multidetector CT imaging of the abdomen and pelvis was performed using the standard protocol following bolus administration of intravenous contrast. CONTRAST:  80mL OMNIPAQUE IOHEXOL 350 MG/ML SOLN COMPARISON:  09/04/2020 FINDINGS: Lower chest: Linear scarring or atelectasis in the lung bases. No effusions. Hepatobiliary: Gallbladder is distended with thick walls, pericholecystic fluid and stranding compatible with cholecystitis. Small hypodensity within the liver likely reflect small cyst. No suspicious focal hepatic abnormality. Pancreas: No focal abnormality or ductal dilatation. Spleen: No focal abnormality.  Normal size. Adrenals/Urinary Tract: Small scattered cortical cysts. No hydronephrosis. Adrenal glands and urinary bladder unremarkable. Stomach/Bowel: Stomach, large and small bowel grossly unremarkable. Vascular/Lymphatic: No evidence of aneurysm or adenopathy. Reproductive: Prior hysterectomy.  No adnexal masses. Other: No free fluid or free air.  Musculoskeletal: No acute bony abnormality. IMPRESSION: Distended gallbladder with wall thickening, pericholecystic fluid and significant surrounding stranding compatible with acute cholecystitis. Electronically Signed   By: Charlett NoseKevin  Dover M.D.   On: 02/23/2021 01:53   US Abdomen Limited RUQ (LIVER/GB)  Result Date: 02/23/2021 CLINICAL DATA:  Right upper quadrant pain, emesis EXAM: ULTRASOUND ABDOMEN LIMITED RIGHT UPPER QUADRANT COMPARISON:  Same day CT abdomen/pelvis FINDINGS: Gallbladder: There are multiple shadowing stones in the gallbladder. There is a small amount of pericholecystic fluid. The gallbladder wall is thickened measuring up to 9 mm. A positive sonographic Murphy's sign was reported by the sonographer. Common bile duct: Diameter: 4 mm Liver: No focal lesion identified. Within normal limits in parenchymal echogenicity. Portal vein is patent on color Doppler imaging with normal direction of blood flow towards the liver. Other: There is trace perihepatic fluid. IMPRESSION: Findings above consistent with acute cholecystitis. Electronically Signed   By: Lesia HausenPeter  Noone M.D.   On: 02/23/2021 09:17        Scheduled Meds:  amLODipine  10 mg Oral Daily   heparin  5,000 Units Subcutaneous  Q8H   lurasidone  60 mg Oral QHS   mometasone-formoterol  2 puff Inhalation BID   mupirocin ointment  1 application Nasal BID   potassium chloride  40 mEq Oral Q4H   venlafaxine XR  300 mg Oral Q breakfast   Continuous Infusions:  dextrose 5 % and 0.45% NaCl 75 mL/hr at 02/23/21 1828   piperacillin-tazobactam (ZOSYN)  IV 3.375 g (02/24/21 0503)     LOS: 1 day   Time spent= 35 mins    Levon Penning Joline Maxcy, MD [facility]  If 7PM-7AM, please contact night-coverage  02/24/2021, 11:56 AM

## 2021-02-24 NOTE — Progress Notes (Signed)
Subjective: She endorses continued right sided abdominal pain with particular intensity in the RUQ but states her pain is more controlled than yesterday. She has had some nausea but no vomiting. She has not had a bowel movement but has passed small amounts of gas. She does not endorse any fever or chills since yesterday.  Objective: Vital signs in last 24 hours: Temp:  [98.5 F (36.9 C)-99.2 F (37.3 C)] 98.6 F (37 C) (09/08 0358) Pulse Rate:  [55-100] 100 (09/08 0358) Resp:  [15-28] 20 (09/08 0358) BP: (111-135)/(57-79) 112/60 (09/08 0358) SpO2:  [89 %-97 %] 92 % (09/08 0358) Last BM Date: 02/18/21  Intake/Output from previous day: 09/07 0701 - 09/08 0700 In: 1149.4 [I.V.:1049.4; IV Piggyback:100] Out: -  Intake/Output this shift: No intake/output data recorded.  General appearance: alert, cooperative, and no distress Resp: normal work of breathing on room air, lungs clear to auscultation bilaterally Cardio: regular rate and rhythm, S1, S2 normal, no murmur, click, rub or gallop GI: soft, non-distended, bowel sounds present, right sided abdominal tenderness to palpation with greatest tenderness in the RUQ  Lab Results:  Recent Labs    02/23/21 2342 02/24/21 0602  WBC 21.8* 21.0*  HGB 11.5* 11.7*  HCT 36.7 37.3  PLT 194 195   BMET Recent Labs    02/22/21 2144 02/23/21 0433  NA 137 138  K 3.3* 3.9  CL 99 99  CO2 26 27  GLUCOSE 120* 98  BUN 15 16  CREATININE 1.20* 1.15*  CALCIUM 9.4 8.5*   PT/INR No results for input(s): LABPROT, INR in the last 72 hours.  Studies/Results: CT ABDOMEN PELVIS W CONTRAST  Result Date: 02/23/2021 CLINICAL DATA:  Right lower quadrant pain EXAM: CT ABDOMEN AND PELVIS WITH CONTRAST TECHNIQUE: Multidetector CT imaging of the abdomen and pelvis was performed using the standard protocol following bolus administration of intravenous contrast. CONTRAST:  37mL OMNIPAQUE IOHEXOL 350 MG/ML SOLN COMPARISON:  09/04/2020 FINDINGS: Lower  chest: Linear scarring or atelectasis in the lung bases. No effusions. Hepatobiliary: Gallbladder is distended with thick walls, pericholecystic fluid and stranding compatible with cholecystitis. Small hypodensity within the liver likely reflect small cyst. No suspicious focal hepatic abnormality. Pancreas: No focal abnormality or ductal dilatation. Spleen: No focal abnormality.  Normal size. Adrenals/Urinary Tract: Small scattered cortical cysts. No hydronephrosis. Adrenal glands and urinary bladder unremarkable. Stomach/Bowel: Stomach, large and small bowel grossly unremarkable. Vascular/Lymphatic: No evidence of aneurysm or adenopathy. Reproductive: Prior hysterectomy.  No adnexal masses. Other: No free fluid or free air. Musculoskeletal: No acute bony abnormality. IMPRESSION: Distended gallbladder with wall thickening, pericholecystic fluid and significant surrounding stranding compatible with acute cholecystitis. Electronically Signed   By: Charlett Nose M.D.   On: 02/23/2021 01:53   US Abdomen Limited RUQ (LIVER/GB)  Result Date: 02/23/2021 CLINICAL DATA:  Right upper quadrant pain, emesis EXAM: ULTRASOUND ABDOMEN LIMITED RIGHT UPPER QUADRANT COMPARISON:  Same day CT abdomen/pelvis FINDINGS: Gallbladder: There are multiple shadowing stones in the gallbladder. There is a small amount of pericholecystic fluid. The gallbladder wall is thickened measuring up to 9 mm. A positive sonographic Murphy's sign was reported by the sonographer. Common bile duct: Diameter: 4 mm Liver: No focal lesion identified. Within normal limits in parenchymal echogenicity. Portal vein is patent on color Doppler imaging with normal direction of blood flow towards the liver. Other: There is trace perihepatic fluid. IMPRESSION: Findings above consistent with acute cholecystitis. Electronically Signed   By: Lesia Hausen M.D.   On: 02/23/2021 09:17    Anti-infectives:  Anti-infectives (From admission, onward)    Start     Dose/Rate  Route Frequency Ordered Stop   02/24/21 0000  piperacillin-tazobactam (ZOSYN) IVPB 3.375 g  Status:  Discontinued        3.375 g 100 mL/hr over 30 Minutes Intravenous Every 6 hours 02/23/21 2057 02/23/21 2059   02/23/21 2130  piperacillin-tazobactam (ZOSYN) IVPB 3.375 g        3.375 g 12.5 mL/hr over 240 Minutes Intravenous Every 8 hours 02/23/21 2059     02/23/21 0230  cefTRIAXone (ROCEPHIN) 2 g in sodium chloride 0.9 % 100 mL IVPB        2 g 200 mL/hr over 30 Minutes Intravenous  Once 02/23/21 0215 02/23/21 0253       Assessment/Plan: Patient with acute cholecystitis by signs, symptoms, CT, and US results with surgery planned for Friday 02/25/21 in the setting of her being positive for cocaine on admission. She is currently stable but does have a WBC >20. It is downtrending but will continue to monitor. -Continue pain management:             -acetaminophen 650 mg q6 hrs prn for mild pain             -oxycodone 5 mg q4 hrs prn for moderate pain             -morphine 2 mg q2 hrs prn for severe pain -Continue heparin -Continue antibiotics -NPO at midnight -Cholecystectomy in the AM   LOS: 1 day    Myer Bohlman M Panzy Bubeck 02/24/2021  

## 2021-02-24 NOTE — H&P (View-Only) (Signed)
Subjective: She endorses continued right sided abdominal pain with particular intensity in the RUQ but states her pain is more controlled than yesterday. She has had some nausea but no vomiting. She has not had a bowel movement but has passed small amounts of gas. She does not endorse any fever or chills since yesterday.  Objective: Vital signs in last 24 hours: Temp:  [98.5 F (36.9 C)-99.2 F (37.3 C)] 98.6 F (37 C) (09/08 0358) Pulse Rate:  [55-100] 100 (09/08 0358) Resp:  [15-28] 20 (09/08 0358) BP: (111-135)/(57-79) 112/60 (09/08 0358) SpO2:  [89 %-97 %] 92 % (09/08 0358) Last BM Date: 02/18/21  Intake/Output from previous day: 09/07 0701 - 09/08 0700 In: 1149.4 [I.V.:1049.4; IV Piggyback:100] Out: -  Intake/Output this shift: No intake/output data recorded.  General appearance: alert, cooperative, and no distress Resp: normal work of breathing on room air, lungs clear to auscultation bilaterally Cardio: regular rate and rhythm, S1, S2 normal, no murmur, click, rub or gallop GI: soft, non-distended, bowel sounds present, right sided abdominal tenderness to palpation with greatest tenderness in the RUQ  Lab Results:  Recent Labs    02/23/21 2342 02/24/21 0602  WBC 21.8* 21.0*  HGB 11.5* 11.7*  HCT 36.7 37.3  PLT 194 195   BMET Recent Labs    02/22/21 2144 02/23/21 0433  NA 137 138  K 3.3* 3.9  CL 99 99  CO2 26 27  GLUCOSE 120* 98  BUN 15 16  CREATININE 1.20* 1.15*  CALCIUM 9.4 8.5*   PT/INR No results for input(s): LABPROT, INR in the last 72 hours.  Studies/Results: CT ABDOMEN PELVIS W CONTRAST  Result Date: 02/23/2021 CLINICAL DATA:  Right lower quadrant pain EXAM: CT ABDOMEN AND PELVIS WITH CONTRAST TECHNIQUE: Multidetector CT imaging of the abdomen and pelvis was performed using the standard protocol following bolus administration of intravenous contrast. CONTRAST:  37mL OMNIPAQUE IOHEXOL 350 MG/ML SOLN COMPARISON:  09/04/2020 FINDINGS: Lower  chest: Linear scarring or atelectasis in the lung bases. No effusions. Hepatobiliary: Gallbladder is distended with thick walls, pericholecystic fluid and stranding compatible with cholecystitis. Small hypodensity within the liver likely reflect small cyst. No suspicious focal hepatic abnormality. Pancreas: No focal abnormality or ductal dilatation. Spleen: No focal abnormality.  Normal size. Adrenals/Urinary Tract: Small scattered cortical cysts. No hydronephrosis. Adrenal glands and urinary bladder unremarkable. Stomach/Bowel: Stomach, large and small bowel grossly unremarkable. Vascular/Lymphatic: No evidence of aneurysm or adenopathy. Reproductive: Prior hysterectomy.  No adnexal masses. Other: No free fluid or free air. Musculoskeletal: No acute bony abnormality. IMPRESSION: Distended gallbladder with wall thickening, pericholecystic fluid and significant surrounding stranding compatible with acute cholecystitis. Electronically Signed   By: Charlett Nose M.D.   On: 02/23/2021 01:53   US Abdomen Limited RUQ (LIVER/GB)  Result Date: 02/23/2021 CLINICAL DATA:  Right upper quadrant pain, emesis EXAM: ULTRASOUND ABDOMEN LIMITED RIGHT UPPER QUADRANT COMPARISON:  Same day CT abdomen/pelvis FINDINGS: Gallbladder: There are multiple shadowing stones in the gallbladder. There is a small amount of pericholecystic fluid. The gallbladder wall is thickened measuring up to 9 mm. A positive sonographic Murphy's sign was reported by the sonographer. Common bile duct: Diameter: 4 mm Liver: No focal lesion identified. Within normal limits in parenchymal echogenicity. Portal vein is patent on color Doppler imaging with normal direction of blood flow towards the liver. Other: There is trace perihepatic fluid. IMPRESSION: Findings above consistent with acute cholecystitis. Electronically Signed   By: Lesia Hausen M.D.   On: 02/23/2021 09:17    Anti-infectives:  Anti-infectives (From admission, onward)    Start     Dose/Rate  Route Frequency Ordered Stop   02/24/21 0000  piperacillin-tazobactam (ZOSYN) IVPB 3.375 g  Status:  Discontinued        3.375 g 100 mL/hr over 30 Minutes Intravenous Every 6 hours 02/23/21 2057 02/23/21 2059   02/23/21 2130  piperacillin-tazobactam (ZOSYN) IVPB 3.375 g        3.375 g 12.5 mL/hr over 240 Minutes Intravenous Every 8 hours 02/23/21 2059     02/23/21 0230  cefTRIAXone (ROCEPHIN) 2 g in sodium chloride 0.9 % 100 mL IVPB        2 g 200 mL/hr over 30 Minutes Intravenous  Once 02/23/21 0215 02/23/21 0253       Assessment/Plan: Patient with acute cholecystitis by signs, symptoms, CT, and [place] results with surgery planned for Friday 02/25/21 in the setting of her being positive for cocaine on admission. She is currently stable but does have a WBC >20. It is downtrending but will continue to monitor. -Continue pain management:             -acetaminophen 650 mg q6 hrs prn for mild pain             -oxycodone 5 mg q4 hrs prn for moderate pain             -morphine 2 mg q2 hrs prn for severe pain -Continue heparin -Continue antibiotics -NPO at midnight -Cholecystectomy in the AM   LOS: 1 day    Runell [facility] 02/24/2021

## 2021-02-25 ENCOUNTER — Inpatient Hospital Stay (HOSPITAL_COMMUNITY): Payer: Medicaid Other | Admitting: Anesthesiology

## 2021-02-25 ENCOUNTER — Encounter (HOSPITAL_COMMUNITY): Payer: Self-pay | Admitting: Family Medicine

## 2021-02-25 ENCOUNTER — Encounter (HOSPITAL_COMMUNITY)
Admission: EM | Disposition: A | Discharge status: Home / Self Care | Payer: Self-pay | Source: Home / Self Care | Attending: Internal Medicine

## 2021-02-25 DIAGNOSIS — K810 Acute cholecystitis: Secondary | ICD-10-CM

## 2021-02-25 HISTORY — PX: CHOLECYSTECTOMY: SHX55

## 2021-02-25 LAB — COMPREHENSIVE METABOLIC PANEL
ALT: 13 U/L (ref 0–44)
AST: 16 U/L (ref 15–41)
Albumin: 2.6 g/dL — ABNORMAL LOW (ref 3.5–5.0)
Alkaline Phosphatase: 100 U/L (ref 38–126)
Anion gap: 10 (ref 5–15)
BUN: 10 mg/dL (ref 8–23)
CO2: 26 mmol/L (ref 22–32)
Calcium: 8.6 mg/dL — ABNORMAL LOW (ref 8.9–10.3)
Chloride: 104 mmol/L (ref 98–111)
Creatinine, Ser: 0.77 mg/dL (ref 0.44–1.00)
GFR, Estimated: >60 mL/min (ref >60)
Glucose, Bld: 85 mg/dL (ref 70–99)
Potassium: 3.8 mmol/L (ref 3.5–5.1)
Sodium: 140 mmol/L (ref 135–145)
Total Bilirubin: 1.2 mg/dL (ref 0.3–1.2)
Total Protein: 6.6 g/dL (ref 6.5–8.1)

## 2021-02-25 LAB — IRON AND TIBC
Iron: 10 ug/dL — ABNORMAL LOW (ref 28–170)
Saturation Ratios: 5 % — ABNORMAL LOW (ref 10.4–31.8)
TIBC: 218 ug/dL — ABNORMAL LOW (ref 250–450)
UIBC: 208 ug/dL

## 2021-02-25 LAB — CBC
HCT: 35.7 % — ABNORMAL LOW (ref 36.0–46.0)
Hemoglobin: 11 g/dL — ABNORMAL LOW (ref 12.0–15.0)
MCH: 21.6 pg — ABNORMAL LOW (ref 26.0–34.0)
MCHC: 30.8 g/dL (ref 30.0–36.0)
MCV: 70 fL — ABNORMAL LOW (ref 80.0–100.0)
Platelets: 225 10*3/uL (ref 150–400)
RBC: 5.1 MIL/uL (ref 3.87–5.11)
RDW: 16.6 % — ABNORMAL HIGH (ref 11.5–15.5)
WBC: 15.4 10*3/uL — ABNORMAL HIGH (ref 4.0–10.5)
nRBC: 0 % (ref 0.0–0.2)

## 2021-02-25 LAB — FERRITIN: Ferritin: 254 ng/mL (ref 11–307)

## 2021-02-25 LAB — GLUCOSE, CAPILLARY
Glucose-Capillary: 128 mg/dL — ABNORMAL HIGH (ref 70–99)
Glucose-Capillary: 88 mg/dL (ref 70–99)
Glucose-Capillary: 96 mg/dL (ref 70–99)

## 2021-02-25 LAB — MAGNESIUM: Magnesium: 2.3 mg/dL (ref 1.7–2.4)

## 2021-02-25 SURGERY — LAPAROSCOPIC CHOLECYSTECTOMY
Anesthesia: General | Site: Abdomen

## 2021-02-25 MED ORDER — HEMOSTATIC AGENTS (NO CHARGE) OPTIME
TOPICAL | Status: DC | PRN
  Administered 2021-02-25: 1 via TOPICAL

## 2021-02-25 MED ORDER — LIDOCAINE HCL (CARDIAC) PF 100 MG/5ML IV SOSY
PREFILLED_SYRINGE | INTRAVENOUS | Status: DC | PRN
  Administered 2021-02-25: 100 mg via INTRAVENOUS

## 2021-02-25 MED ORDER — ONDANSETRON HCL 4 MG/2ML IJ SOLN
INTRAMUSCULAR | Status: DC | PRN
  Administered 2021-02-25: 4 mg via INTRAVENOUS

## 2021-02-25 MED ORDER — CHLORHEXIDINE GLUCONATE CLOTH 2 % EX PADS: 6.0000 | MEDICATED_PAD | Freq: Once | CUTANEOUS | Status: DC

## 2021-02-25 MED ORDER — DEXAMETHASONE SODIUM PHOSPHATE 10 MG/ML IJ SOLN
INTRAMUSCULAR | Status: DC | PRN
Start: 2021-02-25 — End: 2021-02-25
  Administered 2021-02-25: 10 mg via INTRAVENOUS

## 2021-02-25 MED ORDER — ALUM & MAG HYDROXIDE-SIMETH 200-200-20 MG/5ML PO SUSP: 30.0000 mL | Freq: Four times a day (QID) | ORAL | Status: DC | PRN

## 2021-02-25 MED ORDER — LIDOCAINE HCL (PF) 2 % IJ SOLN
INTRAMUSCULAR | Status: AC
  Filled 2021-02-25: qty 5

## 2021-02-25 MED ORDER — BUPIVACAINE LIPOSOME 1.3 % IJ SUSP
INTRAMUSCULAR | Status: AC
  Filled 2021-02-25: qty 20

## 2021-02-25 MED ORDER — SODIUM CHLORIDE 0.9 % IR SOLN
Status: DC | PRN
  Administered 2021-02-25: 1000 mL

## 2021-02-25 MED ORDER — FENTANYL CITRATE (PF) 100 MCG/2ML IJ SOLN
INTRAMUSCULAR | Status: DC | PRN
  Administered 2021-02-25: 100 ug via INTRAVENOUS
  Administered 2021-02-25: 50 ug via INTRAVENOUS
  Administered 2021-02-25: 100 ug via INTRAVENOUS

## 2021-02-25 MED ORDER — BUPIVACAINE LIPOSOME 1.3 % IJ SUSP
INTRAMUSCULAR | Status: DC | PRN
  Administered 2021-02-25: 20 mL

## 2021-02-25 MED ORDER — FENTANYL CITRATE (PF) 250 MCG/5ML IJ SOLN
INTRAMUSCULAR | Status: AC
  Filled 2021-02-25: qty 5

## 2021-02-25 MED ORDER — MIDAZOLAM HCL 5 MG/5ML IJ SOLN
INTRAMUSCULAR | Status: DC | PRN
  Administered 2021-02-25: 2 mg via INTRAVENOUS

## 2021-02-25 MED ORDER — MIDAZOLAM HCL 2 MG/2ML IJ SOLN
INTRAMUSCULAR | Status: AC
  Filled 2021-02-25: qty 2

## 2021-02-25 MED ORDER — PROPOFOL 10 MG/ML IV BOLUS
INTRAVENOUS | Status: AC
  Filled 2021-02-25: qty 20

## 2021-02-25 MED ORDER — ORAL CARE MOUTH RINSE: 15.0000 mL | Freq: Once | OROMUCOSAL | Status: AC

## 2021-02-25 MED ORDER — POVIDONE-IODINE 10 % EX OINT
TOPICAL_OINTMENT | CUTANEOUS | Status: AC
  Filled 2021-02-25: qty 1

## 2021-02-25 MED ORDER — SODIUM CHLORIDE 0.9 % IR SOLN
Status: DC | PRN
  Administered 2021-02-25: 3000 mL

## 2021-02-25 MED ORDER — ROCURONIUM BROMIDE 100 MG/10ML IV SOLN
INTRAVENOUS | Status: DC | PRN
  Administered 2021-02-25: 50 mg via INTRAVENOUS

## 2021-02-25 MED ORDER — ONDANSETRON HCL 4 MG/2ML IJ SOLN
INTRAMUSCULAR | Status: AC
  Filled 2021-02-25: qty 2

## 2021-02-25 MED ORDER — SUGAMMADEX SODIUM 200 MG/2ML IV SOLN
INTRAVENOUS | Status: DC | PRN
  Administered 2021-02-25: 200 mg via INTRAVENOUS

## 2021-02-25 MED ORDER — LORAZEPAM 2 MG/ML IJ SOLN: 1.0000 mg | INTRAMUSCULAR | Status: DC | PRN

## 2021-02-25 MED ORDER — ROCURONIUM BROMIDE 10 MG/ML (PF) SYRINGE
PREFILLED_SYRINGE | INTRAVENOUS | Status: AC
  Filled 2021-02-25: qty 10

## 2021-02-25 MED ORDER — CHLORHEXIDINE GLUCONATE 0.12 % MT SOLN
15.0000 mL | Freq: Once | OROMUCOSAL | Status: AC
  Administered 2021-02-25: 15 mL via OROMUCOSAL

## 2021-02-25 MED ORDER — PROPOFOL 10 MG/ML IV BOLUS
INTRAVENOUS | Status: DC | PRN
  Administered 2021-02-25: 200 mg via INTRAVENOUS

## 2021-02-25 MED ORDER — LACTATED RINGERS IV SOLN
INTRAVENOUS | Status: DC
  Administered 2021-02-25: 1000 mL via INTRAVENOUS

## 2021-02-25 MED ORDER — LACTATED RINGERS IV SOLN: INTRAVENOUS | Status: DC

## 2021-02-25 SURGICAL SUPPLY — 52 items
ADH SKN CLS APL DERMABOND .7 (GAUZE/BANDAGES/DRESSINGS) ×1
APL PRP STRL LF DISP 70% ISPRP (MISCELLANEOUS) ×1
APL SRG 38 LTWT LNG FL B (MISCELLANEOUS) ×1
APPLICATOR ARISTA FLEXITIP XL (MISCELLANEOUS) ×1 IMPLANT
APPLIER CLIP ROT 10 11.4 M/L (STAPLE) ×2
APR CLP MED LRG 11.4X10 (STAPLE) ×1
BAG RETRIEVAL 10 (BASKET) ×2
CHLORAPREP W/TINT 26 (MISCELLANEOUS) ×2 IMPLANT
CLIP APPLIE ROT 10 11.4 M/L (STAPLE) ×1 IMPLANT
CLOTH BEACON ORANGE TIMEOUT ST (SAFETY) ×2 IMPLANT
COVER LIGHT HANDLE STERIS (MISCELLANEOUS) ×4 IMPLANT
DERMABOND ADVANCED (GAUZE/BANDAGES/DRESSINGS) ×1
DERMABOND ADVANCED .7 DNX12 (GAUZE/BANDAGES/DRESSINGS) ×1 IMPLANT
DRSG TEGADERM 2-3/8X2-3/4 SM (GAUZE/BANDAGES/DRESSINGS) ×1 IMPLANT
ELECT REM PT RETURN 9FT ADLT (ELECTROSURGICAL) ×2
ELECTRODE REM PT RTRN 9FT ADLT (ELECTROSURGICAL) ×1 IMPLANT
EVACUATOR DRAINAGE 10X20 100CC (DRAIN) IMPLANT
EVACUATOR SILICONE 100CC (DRAIN) ×2
GLOVE SURG POLYISO LF SZ7.5 (GLOVE) ×2 IMPLANT
GLOVE SURG UNDER POLY LF SZ7 (GLOVE) ×4 IMPLANT
GOWN STRL REUS W/TWL LRG LVL3 (GOWN DISPOSABLE) ×6 IMPLANT
HEMOSTAT ARISTA ABSORB 3G PWDR (HEMOSTASIS) ×1 IMPLANT
HEMOSTAT SNOW SURGICEL 2X4 (HEMOSTASIS) ×2 IMPLANT
INST SET LAPROSCOPIC AP (KITS) ×2 IMPLANT
KIT TURNOVER KIT A (KITS) ×2 IMPLANT
MANIFOLD NEPTUNE II (INSTRUMENTS) ×2 IMPLANT
NDL HYPO 18GX1.5 BLUNT FILL (NEEDLE) ×1 IMPLANT
NDL HYPO 21X1.5 SAFETY (NEEDLE) ×1 IMPLANT
NDL INSUFFLATION 14GA 120MM (NEEDLE) ×1 IMPLANT
NEEDLE HYPO 18GX1.5 BLUNT FILL (NEEDLE) ×2 IMPLANT
NEEDLE HYPO 21X1.5 SAFETY (NEEDLE) ×2 IMPLANT
NEEDLE INSUFFLATION 14GA 120MM (NEEDLE) ×2 IMPLANT
NS IRRIG 1000ML POUR BTL (IV SOLUTION) ×2 IMPLANT
PACK LAP CHOLE LZT030E (CUSTOM PROCEDURE TRAY) ×2 IMPLANT
PAD ARMBOARD 7.5X6 YLW CONV (MISCELLANEOUS) ×2 IMPLANT
SET BASIN LINEN APH (SET/KITS/TRAYS/PACK) ×2 IMPLANT
SET TUBE IRRIG SUCTION NO TIP (IRRIGATION / IRRIGATOR) ×1 IMPLANT
SET TUBE SMOKE EVAC HIGH FLOW (TUBING) ×2 IMPLANT
SLEEVE ENDOPATH XCEL 5M (ENDOMECHANICALS) ×2 IMPLANT
SPONGE GAUZE 2X2 8PLY STRL LF (GAUZE/BANDAGES/DRESSINGS) ×1 IMPLANT
STAPLER VISISTAT (STAPLE) ×1 IMPLANT
SUT ETHILON 3 0 PS 1 (SUTURE) ×1 IMPLANT
SUT MNCRL AB 4-0 PS2 18 (SUTURE) ×4 IMPLANT
SUT VICRYL 0 UR6 27IN ABS (SUTURE) ×2 IMPLANT
SYR 20ML LL LF (SYRINGE) ×4 IMPLANT
SYS BAG RETRIEVAL 10MM (BASKET) ×2
SYSTEM BAG RETRIEVAL 10MM (BASKET) ×1 IMPLANT
TROCAR ENDO BLADELESS 11MM (ENDOMECHANICALS) ×2 IMPLANT
TROCAR XCEL NON-BLD 5MMX100MML (ENDOMECHANICALS) ×2 IMPLANT
TROCAR XCEL UNIV SLVE 11M 100M (ENDOMECHANICALS) ×2 IMPLANT
TUBE CONNECTING 12X1/4 (SUCTIONS) ×2 IMPLANT
WARMER LAPAROSCOPE (MISCELLANEOUS) ×2 IMPLANT

## 2021-02-25 NOTE — Transfer of Care (Signed)
Immediate Anesthesia Transfer of Care Note  Patient: Vicki Dean  Procedure(s) Performed: LAPAROSCOPIC CHOLECYSTECTOMY (Abdomen)  Patient Location: PACU  Anesthesia Type:General  Level of Consciousness: awake and alert   Airway & Oxygen Therapy: Patient Spontanous Breathing and Patient connected to nasal cannula oxygen  Post-op Assessment: Report given to RN and Post -op Vital signs reviewed and stable  Post vital signs: Reviewed and stable  Last Vitals:  Vitals Value Taken Time  BP 151/75 02/25/21 1400  Temp    Pulse 102 02/25/21 1401  Resp 13 02/25/21 1401  SpO2 96 % 02/25/21 1401  Vitals shown include unvalidated device data.  Last Pain:  Vitals:   02/25/21 1120  TempSrc: Oral  PainSc: 7       Patients Stated Pain Goal: 8 (02/25/21 1120)  Complications: No notable events documented.

## 2021-02-25 NOTE — Interval H&P Note (Signed)
History and Physical Interval Note:  02/25/2021 11:17 AM  Vicki Dean  has presented today for surgery, with the diagnosis of acute cholecystitis, cholelithiasis.  The various methods of treatment have been discussed with the patient and family. After consideration of risks, benefits and other options for treatment, the patient has consented to  Procedure(s): LAPAROSCOPIC CHOLECYSTECTOMY (N/A) as a surgical intervention.  The patient's history has been reviewed, patient examined, no change in status, stable for surgery.  I have reviewed the patient's chart and labs.  Questions were answered to the patient's satisfaction.     Franky Macho

## 2021-02-25 NOTE — Progress Notes (Signed)
O2 applied at 2lpm cann due to spo2 fluctuating between 88-92 while patient is asleep

## 2021-02-25 NOTE — Anesthesia Preprocedure Evaluation (Signed)
Anesthesia Evaluation  Patient identified by MRN, date of birth, ID band Patient awake    Reviewed: Allergy & Precautions, H&P , NPO status , Patient's Chart, lab work & pertinent test results, reviewed documented beta blocker date and time   Airway Mallampati: II  TM Distance: >3 FB Neck ROM: full    Dental no notable dental hx.    Pulmonary sleep apnea , COPD, former smoker,    Pulmonary exam normal breath sounds clear to auscultation       Cardiovascular Exercise Tolerance: Good hypertension, negative cardio ROS   Rhythm:regular Rate:Normal     Neuro/Psych  Headaches, PSYCHIATRIC DISORDERS Anxiety Depression  Neuromuscular disease    GI/Hepatic GERD  Medicated,(+)     substance abuse  cocaine use and marijuana use,   Endo/Other  negative endocrine ROS  Renal/GU negative Renal ROS  negative genitourinary   Musculoskeletal   Abdominal   Peds  Hematology  (+) Blood dyscrasia, anemia ,   Anesthesia Other Findings Cocaine positive on Wednesday, case delayed until today, but needs to proceed due to acute cholecystitis.    Reproductive/Obstetrics negative OB ROS                             Anesthesia Physical Anesthesia Plan  ASA: 3 and emergent  Anesthesia Plan: General and General ETT   Post-op Pain Management:    Induction:   PONV Risk Score and Plan: Ondansetron  Airway Management Planned:   Additional Equipment:   Intra-op Plan:   Post-operative Plan:   Informed Consent: I have reviewed the patients History and Physical, chart, labs and discussed the procedure including the risks, benefits and alternatives for the proposed anesthesia with the patient or authorized representative who has indicated his/her understanding and acceptance.     Dental Advisory Given  Plan Discussed with: CRNA  Anesthesia Plan Comments:         Anesthesia Quick Evaluation

## 2021-02-25 NOTE — Plan of Care (Signed)
  Problem: Health Behavior/Discharge Planning: Goal: Ability to manage health-related needs will improve Outcome: Progressing   Problem: Clinical Measurements: Goal: Ability to maintain clinical measurements within normal limits will improve Outcome: Progressing   

## 2021-02-25 NOTE — Op Note (Signed)
Patient:  Vicki Dean  DOB:  [birth year]  MRN:  [id number]   Preop Diagnosis: Acute cholecystitis, cholelithiasis  Postop Diagnosis: Same, gangrene of gallbladder  Procedure: Laparoscopic cholecystectomy  Surgeon: Franky Macho, MD  Assistant: Algis Greenhouse, MD  Anes: General endotracheal  Indications: Patient is a 60 year old black female who presents with acute cholecystitis secondary to cholelithiasis.  The risks and benefits of the procedure including bleeding, infection, hepatobiliary injury, and the possibility of an open procedure were fully explained to the patient, who gave informed consent.  Procedure note: The patient was placed in the supine position.  After induction of general endotracheal anesthesia, the abdomen was prepped and draped using the usual sterile technique with ChloraPrep.  Surgical site confirmation was performed.  A supraumbilical incision was made down to the fascia.  A Veress needle was introduced into the abdominal cavity and confirmation of placement was done using the saline drop test.  The abdomen was then insufflated to 15 mmHg pressure.  An 11 mm trocar was introduced into the abdominal cavity under direct visualization without difficulty.  The patient was placed in reverse Trendelenburg position and an additional 11 mm trocar was placed in the epigastric region and 5 mm trochars were placed in the right upper quadrant and right flank regions.  Liver was inspected and noted to be within normal limits.  The gallbladder was noted to be gangrenous and distended.  It was decompressed.  The back wall of the gallbladder ultimately was noted to be gangrenous with necrotic tissue into the liver.  The gallbladder was retracted in a dynamic fashion in order to provide a critical view of the triangle of Calot.  The cystic duct was identified.  Its junction to the infundibulum was fully identified.  Endoclips were placed proximally and distally on cystic duct, and the  cystic duct was divided.  This was likewise done the cystic artery.  The gallbladder was removed, though the back wall was necrotic and it could not be fully excised due to his necrotic nature.  The gallbladder along with stones were retrieved and removed using an Endo Catch bag.  The gallbladder fossa was copiously irrigated with normal saline.  Any mucosal edges or remnants were cauterized using Bovie electrocautery.  Arista and Surgicel were placed in the gallbladder fossa.  A #10 flat Jackson-Pratt drain was placed into the gallbladder fossa and brought out through a the lateral 5 mm trocar site.  It was secured to the skin level using a 3-0 nylon interrupted suture.  All fluid and air were then evacuated from the abdominal cavity prior to removal of the trochars.  All wounds were irrigated with normal saline.  All wounds were injected with Exparel.  The epigastric fascia was reapproximated using 0 Vicryl interrupted sutures.  All skin incisions were closed using staples.  Betadine ointment and dry sterile dressings were applied.  All tape and needle counts were correct at the end of the procedure.  The patient was extubated in the operating room and transferred to PACU in stable condition.  Complications: None  EBL: Minimal  Specimen: Gallbladder  Drains: Jackson-Pratt drain to gallbladder fossa

## 2021-02-25 NOTE — Anesthesia Procedure Notes (Signed)
Procedure Name: Intubation Date/Time: 02/25/2021 12:46 PM Performed by: Riki Sheer, CRNA Pre-anesthesia Checklist: Patient identified, Emergency Drugs available, Suction available, Patient being monitored and Timeout performed Patient Re-evaluated:Patient Re-evaluated prior to induction Oxygen Delivery Method: Circle system utilized Preoxygenation: Pre-oxygenation with 100% oxygen Induction Type: IV induction Ventilation: Mask ventilation without difficulty Laryngoscope Size: Mac and 3 Grade View: Grade I Tube type: Oral Tube size: 7.0 mm Number of attempts: 1 Airway Equipment and Method: Stylet Placement Confirmation: ETT inserted through vocal cords under direct vision, positive ETCO2, CO2 detector and breath sounds checked- equal and bilateral Secured at: 22 cm Tube secured with: Tape Dental Injury: Teeth and Oropharynx as per pre-operative assessment

## 2021-02-25 NOTE — Progress Notes (Signed)
PROGRESS NOTE    Vicki Dean  UYQ:[id number] DOB: Feb 19, 1961 DOA: 02/22/2021 [facility]CP: [facility]   Brief Narrative:   60 year old with history of substance abuse, HTN, anxiety comes to the hospital with complains of abdominal pain.  She was diagnosed with acute cholecystitis but her UDS was positive for cocaine.  Status post laparoscopic cholecystostomy on 9/9 after allowing cocaine washout.  Assessment & Plan:   Principal Problem:   Acute cholecystitis Active Problems:   Essential hypertension   AKI (acute kidney injury) (HCC)   Hypokalemia   Gangrenous cholecystitis   Acute cholecystitis - Continue empiric IV Zosyn.   -Status post lap chole on 02/25/2021 with finding of gangrenous gallbladder -Further management by general surgery team  Acute kidney injury - Baseline creatinine 0.8.  Admission creatinine 1.2 improved with IV fluids.  Microcytic anemia - Check iron studies  Essential hypertension - On amlodipine  Polysubstance abuse - Positive for cocaine and THC.  Abstinence advised    DVT prophylaxis: SCD's Start: 02/25/21 1753 SCDs Start: 02/23/21 0353 Code Status: Full code Family Communication:    Status is: Inpatient  Remains inpatient appropriate because:Inpatient level of care appropriate due to severity of illness  Dispo: The patient is from: Home              Anticipated d/c is to: Home              Patient currently is not medically stable to d/c.  Plans for cholecystectomy tomorrow   Difficult to place patient No     Subjective: -Resting comfortably postop  Examination:  General exam: Appears calm and comfortable  Respiratory system: Clear to auscultation. Respiratory effort normal. Cardiovascular system: S1 & S2 heard, RRR. No JVD, murmurs, rubs, gallops or clicks. No pedal edema. Gastrointestinal system:Marland Kitchen  Appropriate postoperative tenderness, right upper quadrant drain noted Central nervous system: Alert and oriented. No  focal neurological deficits. Extremities: Symmetric 5 x 5 power. Skin: No rashes, lesions or ulcers Psychiatry: Judgement and insight appear normal. Mood & affect appropriate.     Objective: Vitals:   02/25/21 1415 02/25/21 1421 02/25/21 1430 02/25/21 1803  BP: 138/81  136/76 135/77  Pulse: 99 96 94 83  Resp: 14 (!) 21 18 20   Temp:  98.8 F (37.1 C) 98.8 F (37.1 C) (!) 97.5 F (36.4 C)  TempSrc:    Oral  SpO2: 96% 95% 96% 93%  Weight:      Height:        Intake/Output Summary (Last 24 hours) at 02/25/2021 1918 Last data filed at 02/25/2021 1815 Gross per 24 hour  Intake 940.26 ml  Output 55 ml  Net 885.26 ml   Filed Weights   02/22/21 1944  Weight: 102.1 kg     Data Reviewed:   CBC: Recent Labs  Lab 02/22/21 2144 02/23/21 0433 02/23/21 2342 02/24/21 0602 02/25/21 0541  WBC 25.8* 23.0* 21.8* 21.0* 15.4*  HGB 15.1* 13.7 11.5* 11.7* 11.0*  HCT 46.5* 43.0 36.7 37.3 35.7*  MCV 68.3* 69.0* 69.5* 69.7* 70.0*  PLT 283 230 194 195 225   Basic Metabolic Panel: Recent Labs  Lab 02/22/21 2144 02/23/21 0433 02/24/21 0602 02/25/21 0541  NA 137 138 136 140  K 3.3* 3.9 3.3* 3.8  CL 99 99 99 104  CO2 26 27 25 26   GLUCOSE 120* 98 84 85  BUN 15 16 17 10   CREATININE 1.20* 1.15* 0.89 0.77  CALCIUM 9.4 8.5* 8.3* 8.6*  MG  --   --  1.9 2.3   GFR: Estimated Creatinine Clearance: 90.7 mL/min (by C-G formula based on SCr of 0.77 mg/dL). Liver Function Tests: Recent Labs  Lab 02/22/21 2144 02/23/21 0433 02/24/21 0602 02/25/21 0541  AST 19 18 17 16   ALT 20 15 14 13   ALKPHOS 92 75 88 100  BILITOT 1.8* 1.2 1.2 1.2  PROT 8.3* 7.4 6.4* 6.6  ALBUMIN 4.2 3.4* 2.7* 2.6*   Recent Labs  Lab 02/22/21 2144  LIPASE 24   No results for input(s): AMMONIA in the last 168 hours. Coagulation Profile: No results for input(s): INR, PROTIME in the last 168 hours. Cardiac Enzymes: No results for input(s): CKTOTAL, CKMB, CKMBINDEX, TROPONINI in the last 168 hours. BNP (last  3 results) No results for input(s): PROBNP in the last 8760 hours. HbA1C: No results for input(s): HGBA1C in the last 72 hours. CBG: Recent Labs  Lab 02/24/21 1137 02/24/21 1616 02/25/21 0004 02/25/21 0601 02/25/21 1549  GLUCAP 120* 80 88 96 128*   Lipid Profile: No results for input(s): CHOL, HDL, LDLCALC, TRIG, CHOLHDL, LDLDIRECT in the last 72 hours. Thyroid Function Tests: Recent Labs    02/22/21 2146  TSH 0.443   Anemia Panel: Recent Labs    02/25/21 0541  FERRITIN 254  TIBC 218*  IRON 10*   Sepsis Labs: No results for input(s): PROCALCITON, LATICACIDVEN in the last 168 hours.  Recent Results (from the past 240 hour(s))  Resp Panel by RT-PCR (Flu A&B, Covid) Nasopharyngeal Swab     Status: None   Collection Time: 02/23/21 12:23 AM   Specimen: Nasopharyngeal Swab; Nasopharyngeal(NP) swabs in vial transport medium  Result Value Ref Range Status   SARS Coronavirus 2 by RT PCR NEGATIVE NEGATIVE Final    Comment: (NOTE) SARS-CoV-2 target nucleic acids are NOT DETECTED.  The SARS-CoV-2 RNA is generally detectable in upper respiratory specimens during the acute phase of infection. The lowest concentration of SARS-CoV-2 viral copies this assay can detect is 138 copies/mL. A negative result does not preclude SARS-Cov-2 infection and should not be used as the sole basis for treatment or other patient management decisions. A negative result may occur with  improper specimen collection/handling, submission of specimen other than nasopharyngeal swab, presence of viral mutation(s) within the areas targeted by this assay, and inadequate number of viral copies(<138 copies/mL). A negative result must be combined with clinical observations, patient history, and epidemiological information. The expected result is Negative.  Fact Sheet for Patients:  04/27/21  Fact Sheet for Healthcare Providers:   04/25/21  This test is no t yet approved or cleared by the [employer] FDA and  has been authorized for detection and/or diagnosis of SARS-CoV-2 by FDA under an Emergency Use Authorization (EUA). This EUA will remain  in effect (meaning this test can be used) for the duration of the COVID-19 declaration under Section 564(b)(1) of the Act, 21 U.S.C.section 360bbb-3(b)(1), unless the authorization is terminated  or revoked sooner.       Influenza A by PCR NEGATIVE NEGATIVE Final   Influenza B by PCR NEGATIVE NEGATIVE Final    Comment: (NOTE) The Xpert Xpress SARS-CoV-2/FLU/RSV plus assay is intended as an aid in the diagnosis of influenza from Nasopharyngeal swab specimens and should not be used as a sole basis for treatment. Nasal washings and aspirates are unacceptable for Xpert Xpress SARS-CoV-2/FLU/RSV testing.  Fact Sheet for Patients: [employer]  Fact Sheet for Healthcare Providers: [place]  This test is not yet approved or cleared by the [employer] FDA  and has been authorized for detection and/or diagnosis of SARS-CoV-2 by FDA under an Emergency Use Authorization (EUA). This EUA will remain in effect (meaning this test can be used) for the duration of the COVID-19 declaration under Section 564(b)(1) of the Act, 21 U.S.C. section 360bbb-3(b)(1), unless the authorization is terminated or revoked.  Performed at [facility], [address], [place], [place] [address]   Surgical PCR screen     Status: None   Collection Time: 02/23/21  6:39 PM   Specimen: Nasal Mucosa; Nasal Swab  Result Value Ref Range Status   MRSA, PCR NEGATIVE NEGATIVE Final   Staphylococcus aureus NEGATIVE NEGATIVE Final    Comment: (NOTE) The Xpert SA Assay (FDA approved for NASAL specimens in patients 57 years of age and older), is one component of a comprehensive surveillance program. It is not  intended to diagnose infection nor to guide or monitor treatment. Performed at [facility], [address] [place], [place] [address]          Radiology Studies: No results found.      Scheduled Meds:  amLODipine  10 mg Oral Daily   lurasidone  60 mg Oral QHS   mometasone-formoterol  2 puff Inhalation BID   venlafaxine XR  300 mg Oral Q breakfast   Continuous Infusions:  lactated ringers 100 mL/hr at 02/25/21 1815   piperacillin-tazobactam (ZOSYN)  IV Stopped (02/25/21 1008)     LOS: 2 days      Shon Hale, MD [facility]  If 7PM-7AM, please contact night-coverage  02/25/2021, 7:18 PM

## 2021-02-25 NOTE — Anesthesia Postprocedure Evaluation (Signed)
Anesthesia Post Note  Patient: Vicki Dean  Procedure(s) Performed: LAPAROSCOPIC CHOLECYSTECTOMY (Abdomen)  Patient location during evaluation: PACU Anesthesia Type: General Level of consciousness: awake and alert Pain management: pain level controlled Vital Signs Assessment: post-procedure vital signs reviewed and stable Respiratory status: spontaneous breathing, nonlabored ventilation, respiratory function stable and patient connected to nasal cannula oxygen Cardiovascular status: blood pressure returned to baseline and stable Postop Assessment: no apparent nausea or vomiting Anesthetic complications: no   No notable events documented.   Last Vitals:  Vitals:   02/25/21 1421 02/25/21 1430  BP:  136/76  Pulse: 96 94  Resp: (!) 21 18  Temp: 37.1 C 37.1 C  SpO2: 95% 96%    Last Pain:  Vitals:   02/25/21 1430  TempSrc:   PainSc: 8                  Windell Norfolk

## 2021-02-26 LAB — CBC
HCT: 33.9 % — ABNORMAL LOW (ref 36.0–46.0)
Hemoglobin: 10.5 g/dL — ABNORMAL LOW (ref 12.0–15.0)
MCH: 21.5 pg — ABNORMAL LOW (ref 26.0–34.0)
MCHC: 31 g/dL (ref 30.0–36.0)
MCV: 69.5 fL — ABNORMAL LOW (ref 80.0–100.0)
Platelets: 259 10*3/uL (ref 150–400)
RBC: 4.88 MIL/uL (ref 3.87–5.11)
RDW: 16.5 % — ABNORMAL HIGH (ref 11.5–15.5)
WBC: 12.2 10*3/uL — ABNORMAL HIGH (ref 4.0–10.5)
nRBC: 0 % (ref 0.0–0.2)

## 2021-02-26 LAB — COMPREHENSIVE METABOLIC PANEL
ALT: 16 U/L (ref 0–44)
AST: 20 U/L (ref 15–41)
Albumin: 2.5 g/dL — ABNORMAL LOW (ref 3.5–5.0)
Alkaline Phosphatase: 99 U/L (ref 38–126)
Anion gap: 8 (ref 5–15)
BUN: 10 mg/dL (ref 8–23)
CO2: 28 mmol/L (ref 22–32)
Calcium: 8.7 mg/dL — ABNORMAL LOW (ref 8.9–10.3)
Chloride: 104 mmol/L (ref 98–111)
Creatinine, Ser: 0.63 mg/dL (ref 0.44–1.00)
GFR, Estimated: >60 mL/min (ref >60)
Glucose, Bld: 139 mg/dL — ABNORMAL HIGH (ref 70–99)
Potassium: 3.8 mmol/L (ref 3.5–5.1)
Sodium: 140 mmol/L (ref 135–145)
Total Bilirubin: 0.4 mg/dL (ref 0.3–1.2)
Total Protein: 6.6 g/dL (ref 6.5–8.1)

## 2021-02-26 LAB — GLUCOSE, CAPILLARY
Glucose-Capillary: 136 mg/dL — ABNORMAL HIGH (ref 70–99)
Glucose-Capillary: 140 mg/dL — ABNORMAL HIGH (ref 70–99)
Glucose-Capillary: 141 mg/dL — ABNORMAL HIGH (ref 70–99)

## 2021-02-26 LAB — MAGNESIUM: Magnesium: 2.7 mg/dL — ABNORMAL HIGH (ref 1.7–2.4)

## 2021-02-26 MED ORDER — AMOXICILLIN-POT CLAVULANATE 875-125 MG PO TABS: 1.0000 | ORAL_TABLET | Freq: Two times a day (BID) | ORAL | 0 refills | Status: AC

## 2021-02-26 MED ORDER — SENNOSIDES-DOCUSATE SODIUM 8.6-50 MG PO TABS: 2.0000 | ORAL_TABLET | Freq: Every day | ORAL | 1 refills | Status: DC

## 2021-02-26 MED ORDER — FAMOTIDINE 20 MG PO TABS: 20.0000 mg | ORAL_TABLET | Freq: Two times a day (BID) | ORAL | 1 refills | Status: DC

## 2021-02-26 MED ORDER — AMLODIPINE BESYLATE 10 MG PO TABS: 10.0000 mg | ORAL_TABLET | Freq: Every day | ORAL | 0 refills | Status: DC

## 2021-02-26 MED ORDER — FERROUS SULFATE 325 (65 FE) MG PO TBEC: 325.0000 mg | DELAYED_RELEASE_TABLET | Freq: Two times a day (BID) | ORAL | 3 refills | Status: DC

## 2021-02-26 MED ORDER — ACETAMINOPHEN 325 MG PO TABS: 650.0000 mg | ORAL_TABLET | Freq: Four times a day (QID) | ORAL | 1 refills | Status: DC | PRN

## 2021-02-26 MED ORDER — ONDANSETRON HCL 4 MG PO TABS: 4.0000 mg | ORAL_TABLET | Freq: Four times a day (QID) | ORAL | 0 refills | Status: DC | PRN

## 2021-02-26 MED ORDER — TRAZODONE HCL 100 MG PO TABS: 100.0000 mg | ORAL_TABLET | Freq: Every day | ORAL | 3 refills | Status: DC

## 2021-02-26 MED ORDER — OXYCODONE HCL 5 MG PO TABS: 5.0000 mg | ORAL_TABLET | Freq: Four times a day (QID) | ORAL | 0 refills | Status: DC | PRN

## 2021-02-26 NOTE — Discharge Summary (Signed)
Vicki Dean, is a 60 y.o. female  DOB 21-May-1961  MRN [id number].  Admission date:  02/22/2021  Admitting Physician  Lilyan Gilford, DO  Discharge Date:  02/26/2021   Primary MD  Center, [facility]  Recommendations for primary care physician for things to follow:  1)Complete Abstinence from Cocaine and illicit drugs advised 2)Continue Right sided Belly  Drain care as advised by Dr Lovell Sheehan 3)Please follow up with Dr Lovell Sheehan on Thursday 03/03/2021 for wound check and possible JP drain removal  4)Stop HCTZ---hydrochlorthiazide 5)You have iron deficiency Anemia--and your older brother has colon cancer--- you need to have a colonoscopy as soon as possible to make sure that you do not have colon cancer   Admission Diagnosis  Acute cholecystitis [K81.0] Abdominal pain, right upper quadrant [R10.11] Hypokalemia [E87.6] Serum total bilirubin elevated [R17] Acute kidney injury (nontraumatic) (HCC) [N17.9]   Discharge Diagnosis  Acute cholecystitis [K81.0] Abdominal pain, right upper quadrant [R10.11] Hypokalemia [E87.6] Serum total bilirubin elevated [R17] Acute kidney injury (nontraumatic) (HCC) [N17.9]    Principal Problem:   Acute cholecystitis Active Problems:   Essential hypertension   AKI (acute kidney injury) (HCC)   Hypokalemia   Gangrenous cholecystitis      Past Medical History:  Diagnosis Date   Anxiety    Arthritis    possibly her right shoulder.   Chronic back pain    COPD (chronic obstructive pulmonary disease) (HCC)    Fibromyalgia    GERD (gastroesophageal reflux disease)    Hypertension    Sleep apnea    could not tolerate    Past Surgical History:  Procedure Laterality Date   ABDOMINAL HYSTERECTOMY     CARPAL TUNNEL RELEASE Left    COLONOSCOPY WITH PROPOFOL N/A 08/23/2017   Dr. Jena Gauss, grade II hemorrhoids. next TCS in 10 years.    ESOPHAGOGASTRODUODENOSCOPY  (EGD) WITH PROPOFOL N/A 08/23/2017   Dr. Jena Gauss: mild erosive reflux esophagitis s/p dilation due to h/o dysphagia.    FOOT SURGERY Right    screws from fracture   GANGLION CYST EXCISION Right    foot   KNEE SURGERY Right    arthroscopy   MALONEY DILATION N/A 08/23/2017   Procedure: Elease Hashimoto DILATION;  Surgeon: Corbin Ade, MD;  [facility]: [facility];  Service: Endoscopy;  Laterality: N/A;     HPI  from the history and physical done on the day of admission:     Vicki Dean  is a 60 y.o. female, with history of substance abuse, COPD, GERD, hypertension, anxiety, and more presents the ED with a chief complaint of abdominal pain.  She reports that she has right side and stomach pain that started 2 days ago.  It is worse since it started.  She has had episodes of emesis that are nonbloody.  She reports hematuria at home however UA is negative for hemoglobin here.  She reports that eating makes the pain worse.  Her last meal was day before yesterday.  She has associated fever and chills.  She  has not taken any medication for it.  She denies any diarrhea.  She is never had pain like this before.   Patient current smoker, drinks a couple times a week usually wine cooler, and uses cocaine.  Her last use of cocaine was 5 days ago.  She is vaccinated for COVID.  She is full code.   In the ED Temp 98.6-103 in triage, heart rate 81-86, respiratory rate 19, blood pressure 134/81, satting at 94% Leukocytosis with a white blood cell count of 26, hemoglobin 15.1 Chemistry panel reveals hypokalemia at 3.3, slightly elevated creatinine at 1.2 Negative respiratory panel UA shows signs of dehydration CT abdomen shows distended gallbladder, with wall thickening, pericholecystic fluid, significant surrounding stranding compatible with acute cholecystitis Rocephin, 1 L LR, morphine 4 mg x 2, Zofran given in the ED General surgery consulted and reports medicine admit they will see in the a.m.     Hospital  Course:      Acute cholecystitis -Status post lap chole on 02/25/2021 with finding of gangrenous gallbladder -Treated with IV Zosyn, general surgery recommends discharge home on p.o. Augmentin for additional 1 week -Patient follow-up with general surgeon on 03/03/2021 for reevaluation and possible JP drain removal -JP drain continues to have serosanguineous drainage per general surgeon-okay to discharge   Acute kidney injury - Baseline creatinine 0.6.  Admission creatinine 1.2 -Renal function normalized with IV fluids, creatinine down to 0.6 improved with IV fluids.   Microcytic anemia/chronic iron deficiency anemia --Work-up suggest iron deficiency anemia with serum iron of 10 -Hemoglobin is stable at 10.5 -Last colonoscopy more than 10 years ago -Patient brother who is 44 years old was recently diagnosed with colon cancer -Patient is strongly, strongly advised [facility] as [facility] as possible    Essential hypertension - On amlodipine   Polysubstance abuse - Positive for cocaine and THC.  Abstinence advised    Discharge Condition: stable  Follow UP   Follow-up Information     Franky Macho, MD. Schedule an appointment as soon as possible for a visit on 03/03/2021.   Specialty: General Surgery Contact information: [address] [place] [address] [phone]                  Consults obtained - gen surgery  Diet and Activity recommendation:  As advised  Discharge Instructions    Discharge Instructions     Call MD for:  difficulty breathing, headache or visual disturbances   Complete by: As directed    Call MD for:  persistant dizziness or light-headedness   Complete by: As directed    Call MD for:  persistant nausea and vomiting   Complete by: As directed    Call MD for:  redness, tenderness, or signs of infection (pain, swelling, redness, odor or green/yellow discharge around incision site)   Complete by: As  directed    Call MD for:  temperature >100.4   Complete by: As directed    Diet - low sodium heart healthy   Complete by: As directed    Discharge instructions   Complete by: As directed    1)Complete Abstinence from Cocaine and illicit drugs advised 2)Continue Right sided Belly  Drain care as advised by Dr Lovell Sheehan 3)Please follow up with Dr Lovell Sheehan on Thursday 03/03/2021 for wound check and possible JP drain removal  4)Stop HCTZ---hydrochlorthiazide 5)You have iron deficiency Anemia--and your older brother has colon cancer--- you need to have a colonoscopy as soon as possible  to make sure that you do not have colon cancer   Discharge wound care:   Complete by: As directed    As advised by Dr Corky CraftsJenkins   Increase activity slowly   Complete by: As directed    Leave dressing on - Keep it clean, dry, and intact until clinic visit   Complete by: As directed          Discharge Medications     Allergies as of 02/26/2021       Reactions   Doxepin    Pt unsure of reaction   Lisinopril    cough   Omeprazole    Doesn't agree with her   Pneumococcal Vaccines    Arm swelling        Medication List     STOP taking these medications    cephALEXin 500 MG capsule Commonly known as: KEFLEX   ferrous sulfate 325 (65 FE) MG tablet Replaced by: ferrous sulfate 325 (65 FE) MG EC tablet   hydrochlorothiazide 25 MG tablet Commonly known as: HYDRODIURIL   meloxicam 15 MG tablet Commonly known as: MOBIC   phenazopyridine 200 MG tablet Commonly known as: PYRIDIUM       TAKE these medications    acetaminophen 325 MG tablet Commonly known as: TYLENOL Take 2 tablets (650 mg total) by mouth every 6 (six) hours as needed for mild pain (or Fever >/= 101).   albuterol 108 (90 Base) MCG/ACT inhaler Commonly known as: VENTOLIN HFA Inhale 1-2 puffs into the lungs every 6 (six) hours as needed for wheezing or shortness of breath.   amLODipine 10 MG tablet Commonly known as:  NORVASC Take 1 tablet (10 mg total) by mouth daily. Start taking on: February 28, 2021 What changed: These instructions start on February 28, 2021. If you are unsure what to do until then, ask your doctor or other care provider.   amoxicillin-clavulanate 875-125 MG tablet Commonly known as: Augmentin Take 1 tablet by mouth 2 (two) times daily for 7 days.   budesonide-formoterol 160-4.5 MCG/ACT inhaler Commonly known as: SYMBICORT Inhale 2 puffs into the lungs 2 (two) times daily.   Cholecalciferol 25 MCG (1000 UT) tablet Take 2 tablets by mouth daily.   famotidine 20 MG tablet Commonly known as: PEPCID Take 1 tablet (20 mg total) by mouth 2 (two) times daily.   ferrous sulfate 325 (65 FE) MG EC tablet Take 1 tablet (325 mg total) by mouth 2 (two) times daily with a meal. Replaces: ferrous sulfate 325 (65 FE) MG tablet   gabapentin 800 MG tablet Commonly known as: NEURONTIN Take 400 mg by mouth in the morning, at noon, and at bedtime.   lidocaine 5 % Commonly known as: LIDODERM Place 1 patch onto the skin daily as needed (pain).   Lurasidone HCl 120 MG Tabs Take 0.5 tablets by mouth at bedtime.   naproxen 500 MG tablet Commonly known as: NAPROSYN Take 1 tablet by mouth in the morning and at bedtime.   ondansetron 4 MG tablet Commonly known as: ZOFRAN Take 1 tablet (4 mg total) by mouth every 6 (six) hours as needed for nausea.   oxyCODONE 5 MG immediate release tablet Commonly known as: Oxy IR/ROXICODONE Take 1 tablet (5 mg total) by mouth every 6 (six) hours as needed for moderate pain or severe pain.   potassium chloride SA 20 MEQ tablet Commonly known as: KLOR-CON Take 1 tablet (20 mEq total) by mouth 2 (two) times daily.   senna-docusate 8.6-50 MG  tablet Commonly known as: Senokot-S Take 2 tablets by mouth at bedtime.   traZODone 100 MG tablet Commonly known as: DESYREL Take 1 tablet (100 mg total) by mouth at bedtime.   venlafaxine XR 150 MG 24 hr  capsule Commonly known as: EFFEXOR-XR Take 2 capsules by mouth daily.               Discharge Care Instructions  (From admission, onward)           Start     Ordered   02/26/21 0000  Discharge wound care:       Comments: As advised by Dr Lovell Sheehan   02/26/21 1121   02/26/21 0000  Leave dressing on - Keep it clean, dry, and intact until clinic visit        02/26/21 1121            Major procedures and Radiology Reports - PLEASE review detailed and final reports for all details, in brief -   CT ABDOMEN PELVIS W CONTRAST  Result Date: 02/23/2021 CLINICAL DATA:  Right lower quadrant pain EXAM: CT ABDOMEN AND PELVIS WITH CONTRAST TECHNIQUE: Multidetector CT imaging of the abdomen and pelvis was performed using the standard protocol following bolus administration of intravenous contrast. CONTRAST:  3mL OMNIPAQUE IOHEXOL 350 MG/ML SOLN COMPARISON:  09/04/2020 FINDINGS: Lower chest: Linear scarring or atelectasis in the lung bases. No effusions. Hepatobiliary: Gallbladder is distended with thick walls, pericholecystic fluid and stranding compatible with cholecystitis. Small hypodensity within the liver likely reflect small cyst. No suspicious focal hepatic abnormality. Pancreas: No focal abnormality or ductal dilatation. Spleen: No focal abnormality.  Normal size. Adrenals/Urinary Tract: Small scattered cortical cysts. No hydronephrosis. Adrenal glands and urinary bladder unremarkable. Stomach/Bowel: Stomach, large and small bowel grossly unremarkable. Vascular/Lymphatic: No evidence of aneurysm or adenopathy. Reproductive: Prior hysterectomy.  No adnexal masses. Other: No free fluid or free air. Musculoskeletal: No acute bony abnormality. IMPRESSION: Distended gallbladder with wall thickening, pericholecystic fluid and significant surrounding stranding compatible with acute cholecystitis. Electronically Signed   By: Charlett Nose M.D.   On: 02/23/2021 01:53   US Abdomen Limited RUQ  (LIVER/GB)  Result Date: 02/23/2021 CLINICAL DATA:  Right upper quadrant pain, emesis EXAM: ULTRASOUND ABDOMEN LIMITED RIGHT UPPER QUADRANT COMPARISON:  Same day CT abdomen/pelvis FINDINGS: Gallbladder: There are multiple shadowing stones in the gallbladder. There is a small amount of pericholecystic fluid. The gallbladder wall is thickened measuring up to 9 mm. A positive sonographic Murphy's sign was reported by the sonographer. Common bile duct: Diameter: 4 mm Liver: No focal lesion identified. Within normal limits in parenchymal echogenicity. Portal vein is patent on color Doppler imaging with normal direction of blood flow towards the liver. Other: There is trace perihepatic fluid. IMPRESSION: Findings above consistent with acute cholecystitis. Electronically Signed   By: Lesia Hausen M.D.   On: 02/23/2021 09:17    Micro Results   Recent Results (from the past 240 hour(s))  Resp Panel by RT-PCR (Flu A&B, Covid) Nasopharyngeal Swab     Status: None   Collection Time: 02/23/21 12:23 AM   Specimen: Nasopharyngeal Swab; Nasopharyngeal(NP) swabs in vial transport medium  Result Value Ref Range Status   SARS Coronavirus 2 by RT PCR NEGATIVE NEGATIVE Final    Comment: (NOTE) SARS-CoV-2 target nucleic acids are NOT DETECTED.  The SARS-CoV-2 RNA is generally detectable in upper respiratory specimens during the acute phase of infection. The lowest concentration of SARS-CoV-2 viral copies this assay can detect is 138 copies/mL. A  negative result does not preclude SARS-Cov-2 infection and should not be used as the sole basis for treatment or other patient management decisions. A negative result may occur with  improper specimen collection/handling, submission of specimen other than nasopharyngeal swab, presence of viral mutation(s) within the areas targeted by this assay, and inadequate number of viral copies(<138 copies/mL). A negative result must be combined with clinical observations, patient  history, and epidemiological information. The expected result is Negative.  Fact Sheet for Patients:  BloggerCourse.com  Fact Sheet for Healthcare Providers:  SeriousBroker.it  This test is no t yet approved or cleared by the [place] FDA and  has been authorized for detection and/or diagnosis of SARS-CoV-2 by FDA under an Emergency Use Authorization (EUA). This EUA will remain  in effect (meaning this test can be used) for the duration of the COVID-19 declaration under Section 564(b)(1) of the Act, 21 U.S.C.section 360bbb-3(b)(1), unless the authorization is terminated  or revoked sooner.       Influenza A by PCR NEGATIVE NEGATIVE Final   Influenza B by PCR NEGATIVE NEGATIVE Final    Comment: (NOTE) The Xpert Xpress SARS-CoV-2/FLU/RSV plus assay is intended as an aid in the diagnosis of influenza from Nasopharyngeal swab specimens and should not be used as a sole basis for treatment. Nasal washings and aspirates are unacceptable for Xpert Xpress SARS-CoV-2/FLU/RSV testing.  Fact Sheet for Patients: BloggerCourse.com  Fact Sheet for Healthcare Providers: SeriousBroker.it  This test is not yet approved or cleared by the [place] FDA and has been authorized for detection and/or diagnosis of SARS-CoV-2 by FDA under an Emergency Use Authorization (EUA). This EUA will remain in effect (meaning this test can be used) for the duration of the COVID-19 declaration under Section 564(b)(1) of the Act, 21 U.S.C. section 360bbb-3(b)(1), unless the authorization is terminated or revoked.  Performed at [facility], [address] [place], [place] [address]   Surgical PCR screen     Status: None   Collection Time: 02/23/21  6:39 PM   Specimen: Nasal Mucosa; Nasal Swab  Result Value Ref Range Status   MRSA, PCR NEGATIVE NEGATIVE Final   Staphylococcus aureus NEGATIVE NEGATIVE  Final    Comment: (NOTE) The Xpert SA Assay (FDA approved for NASAL specimens in patients 70 years of age and older), is one component of a comprehensive surveillance program. It is not intended to diagnose infection nor to guide or monitor treatment. Performed at [facility], [address] [place], [place] [address]        Today   Subjective    Vicki Dean today has no new complaints  No fever  Or chills   No Nausea, Vomiting or Diarrhea          Patient has been seen and examined prior to discharge   Objective   Blood pressure 102/90, pulse 65, temperature 98.2 F (36.8 C), temperature source Oral, resp. rate 18, height  (1.702 m), weight 102.1 kg, SpO2 98 %.   Intake/Output Summary (Last 24 hours) at 02/26/2021 1147 Last data filed at 02/26/2021 0900 Gross per 24 hour  Intake 1160.26 ml  Output 105 ml  Net 1055.26 ml    Exam Gen:- Awake Alert, no acute distress  HEENT:- [place].AT, No sclera icterus Neck-Supple Neck,No JVD,.  Lungs-  CTAB , good air movement bilaterally  CV- S1, S2 normal, regular Abd-  +ve B.Sounds, Abd Soft, appropriate postoperative tenderness, JP drain with serosanguineous drainage in the right upper quadrant  extremity/Skin:- No  edema,   good pulses Psych-affect is appropriate, oriented x3 Neuro-no new focal deficits, no tremors    Data Review   CBC w Diff:  Lab Results  Component Value Date   WBC 12.2 (H) 02/26/2021   HGB 10.5 (L) 02/26/2021   HCT 33.9 (L) 02/26/2021   PLT 259 02/26/2021   LYMPHOPCT 29 09/04/2020   MONOPCT 8 09/04/2020   EOSPCT 2 09/04/2020   BASOPCT 1 09/04/2020    CMP:  Lab Results  Component Value Date   NA 140 02/26/2021   K 3.8 02/26/2021   CL 104 02/26/2021   CO2 28 02/26/2021   BUN 10 02/26/2021   CREATININE 0.63 02/26/2021   PROT 6.6 02/26/2021   ALBUMIN 2.5 (L) 02/26/2021   BILITOT 0.4 02/26/2021   ALKPHOS 99 02/26/2021   AST 20 02/26/2021   ALT 16 02/26/2021  .   Total Discharge  time is about 33 minutes  Shon Hale M.D on 02/26/2021 at 11:47 AM  Go to [email or web address] -  for contact info  [facility] - Office  [phone]

## 2021-02-26 NOTE — Progress Notes (Signed)
Nsg Discharge Note  Admit Date:  02/22/2021 Discharge date: 02/26/2021   KALIYAN OSBOURN to be D/C'd Home per MD order.  AVS completed.  Copy for chart, and copy for patient signed, and dated. Patient/caregiver able to verbalize understanding.  Discharge Medication: Allergies as of 02/26/2021       Reactions   Doxepin    Pt unsure of reaction   Lisinopril    cough   Omeprazole    Doesn't agree with her   Pneumococcal Vaccines    Arm swelling        Medication List     STOP taking these medications    cephALEXin 500 MG capsule Commonly known as: KEFLEX   ferrous sulfate 325 (65 FE) MG tablet Replaced by: ferrous sulfate 325 (65 FE) MG EC tablet   hydrochlorothiazide 25 MG tablet Commonly known as: HYDRODIURIL   meloxicam 15 MG tablet Commonly known as: MOBIC   phenazopyridine 200 MG tablet Commonly known as: PYRIDIUM       TAKE these medications    acetaminophen 325 MG tablet Commonly known as: TYLENOL Take 2 tablets (650 mg total) by mouth every 6 (six) hours as needed for mild pain (or Fever >/= 101).   albuterol 108 (90 Base) MCG/ACT inhaler Commonly known as: VENTOLIN HFA Inhale 1-2 puffs into the lungs every 6 (six) hours as needed for wheezing or shortness of breath.   amLODipine 10 MG tablet Commonly known as: NORVASC Take 1 tablet (10 mg total) by mouth daily. Start taking on: February 28, 2021 What changed: These instructions start on February 28, 2021. If you are unsure what to do until then, ask your doctor or other care provider.   amoxicillin-clavulanate 875-125 MG tablet Commonly known as: Augmentin Take 1 tablet by mouth 2 (two) times daily for 7 days.   budesonide-formoterol 160-4.5 MCG/ACT inhaler Commonly known as: SYMBICORT Inhale 2 puffs into the lungs 2 (two) times daily.   Cholecalciferol 25 MCG (1000 UT) tablet Take 2 tablets by mouth daily.   famotidine 20 MG tablet Commonly known as: PEPCID Take 1 tablet (20 mg total) by  mouth 2 (two) times daily.   ferrous sulfate 325 (65 FE) MG EC tablet Take 1 tablet (325 mg total) by mouth 2 (two) times daily with a meal. Replaces: ferrous sulfate 325 (65 FE) MG tablet   gabapentin 800 MG tablet Commonly known as: NEURONTIN Take 400 mg by mouth in the morning, at noon, and at bedtime.   lidocaine 5 % Commonly known as: LIDODERM Place 1 patch onto the skin daily as needed (pain).   Lurasidone HCl 120 MG Tabs Take 0.5 tablets by mouth at bedtime.   naproxen 500 MG tablet Commonly known as: NAPROSYN Take 1 tablet by mouth in the morning and at bedtime.   ondansetron 4 MG tablet Commonly known as: ZOFRAN Take 1 tablet (4 mg total) by mouth every 6 (six) hours as needed for nausea.   oxyCODONE 5 MG immediate release tablet Commonly known as: Oxy IR/ROXICODONE Take 1 tablet (5 mg total) by mouth every 6 (six) hours as needed for moderate pain or severe pain.   potassium chloride SA 20 MEQ tablet Commonly known as: KLOR-CON Take 1 tablet (20 mEq total) by mouth 2 (two) times daily.   senna-docusate 8.6-50 MG tablet Commonly known as: Senokot-S Take 2 tablets by mouth at bedtime.   traZODone 100 MG tablet Commonly known as: DESYREL Take 1 tablet (100 mg total) by mouth at bedtime.  venlafaxine XR 150 MG 24 hr capsule Commonly known as: EFFEXOR-XR Take 2 capsules by mouth daily.               Discharge Care Instructions  (From admission, onward)           Start     Ordered   02/26/21 0000  Discharge wound care:       Comments: As advised by Dr Lovell Sheehan   02/26/21 1121   02/26/21 0000  Leave dressing on - Keep it clean, dry, and intact until clinic visit        02/26/21 1121            Discharge Assessment: Vitals:   02/26/21 0809 02/26/21 0936  BP:  102/90  Pulse:    Resp:    Temp:    SpO2: 98%    Skin clean, dry and intact without evidence of skin break down, no evidence of skin tears noted. IV catheter discontinued  intact. Site without signs and symptoms of complications - no redness or edema noted at insertion site, patient denies c/o pain - only slight tenderness at site.  Dressing with slight pressure applied.  D/c Instructions-Education: Discharge instructions given to patient/family with verbalized understanding. D/c education completed with patient/family including follow up instructions, medication list, d/c activities limitations if indicated, with other d/c instructions as indicated by MD - patient able to verbalize understanding, all questions fully answered. Patient instructed to return to ED, call 911, or call MD for any changes in condition.  Patient escorted via [facility], and D/C home via private auto Demetrio Lapping, LPN [id number]/5374 82:70 PM

## 2021-02-26 NOTE — Discharge Instructions (Addendum)
1)Complete Abstinence from Cocaine and illicit drugs advised 2)Continue Right sided Belly  Drain care as advised by Dr Lovell Sheehan 3)Please follow up with Dr Lovell Sheehan on Thursday 03/03/2021 for wound check and possible JP drain removal  4)Stop HCTZ---hydrochlorthiazide 5)You have iron deficiency Anemia--and your older brother has colon cancer--- you need to have a colonoscopy as soon as possible to make sure that you do not have colon cancer

## 2021-02-26 NOTE — Progress Notes (Signed)
1 Day Post-Op  Subjective: Patient feels much better.  Mild incisional pain.  Objective: Vital signs in last 24 hours: Temp:  [97.5 F (36.4 C)-99.5 F (37.5 C)] 98.2 F (36.8 C) (09/10 0625) Pulse Rate:  [65-99] 65 (09/10 0625) Resp:  [14-21] 18 (09/10 0203) BP: (121-138)/(64-81) 121/64 (09/10 0625) SpO2:  [89 %-100 %] 98 % (09/10 0809) Last BM Date: 02/18/21  Intake/Output from previous day: 09/09 0701 - 09/10 0700 In: 940.3 [I.V.:811.5; IV Piggyback:128.8] Out: 75 [Drains:50; Blood:25] Intake/Output this shift: Total I/O In: 220 [P.O.:220] Out: -   General appearance: alert, cooperative, and no distress GI: Soft, incisions healing well.  Serosanguineous drainage in JP drain.  Lab Results:  Recent Labs    02/25/21 0541 02/26/21 0603  WBC 15.4* 12.2*  HGB 11.0* 10.5*  HCT 35.7* 33.9*  PLT 225 259   BMET Recent Labs    02/25/21 0541 02/26/21 0603  NA 140 140  K 3.8 3.8  CL 104 104  CO2 26 28  GLUCOSE 85 139*  BUN 10 10  CREATININE 0.77 0.63  CALCIUM 8.6* 8.7*   PT/INR No results for input(s): LABPROT, INR in the last 72 hours.  Studies/Results: No results found.  Anti-infectives: Anti-infectives (From admission, onward)    Start     Dose/Rate Route Frequency Ordered Stop   02/24/21 0000  piperacillin-tazobactam (ZOSYN) IVPB 3.375 g  Status:  Discontinued        3.375 g 100 mL/hr over 30 Minutes Intravenous Every 6 hours 02/23/21 2057 02/23/21 2059   02/23/21 2130  piperacillin-tazobactam (ZOSYN) IVPB 3.375 g        3.375 g 12.5 mL/hr over 240 Minutes Intravenous Every 8 hours 02/23/21 2059     02/23/21 0230  cefTRIAXone (ROCEPHIN) 2 g in sodium chloride 0.9 % 100 mL IVPB        2 g 200 mL/hr over 30 Minutes Intravenous  Once 02/23/21 0215 02/23/21 0253       Assessment/Plan: s/p Procedure(s): LAPAROSCOPIC CHOLECYSTECTOMY Impression: Stable on postoperative day 1, status post laparoscopic cholecystectomy for gangrenous cholecystitis.  She  does have a drain in place and that needs to stay in until I see her in my office on 03/03/2021.  She should be discharged home on Augmentin for 1 week.  LOS: 3 days    Franky Macho 02/26/2021

## 2021-02-28 ENCOUNTER — Encounter (HOSPITAL_COMMUNITY): Payer: Self-pay | Admitting: General Surgery

## 2021-03-01 LAB — SURGICAL PATHOLOGY

## 2021-03-03 ENCOUNTER — Ambulatory Visit (INDEPENDENT_AMBULATORY_CARE_PROVIDER_SITE_OTHER): Payer: Medicaid Other | Admitting: General Surgery

## 2021-03-03 ENCOUNTER — Other Ambulatory Visit: Payer: Self-pay

## 2021-03-03 ENCOUNTER — Encounter: Payer: Self-pay | Admitting: General Surgery

## 2021-03-03 VITALS — BP 125/80 | HR 68 | Temp 98.6°F | Resp 16 | Ht 67.0 in | Wt 211.0 lb

## 2021-03-03 DIAGNOSIS — Z09 Encounter for follow-up examination after completed treatment for conditions other than malignant neoplasm: Secondary | ICD-10-CM

## 2021-03-03 NOTE — Progress Notes (Signed)
Subjective:     Vicki Dean  Patient is status post laparoscopic cholecystectomy with JP drain placement.  She has been doing very well.  Minimal serosanguineous drainage has been noted.  She has been a little nauseated, but this may be secondary to her antibiotic that she is finishing in 2 days. Objective:    BP 125/80   Pulse 68   Temp 98.6 F (37 C) (Other (Comment))   Resp 16   Ht 5\' 7"  (1.702 m)   Wt 211 lb (95.7 kg)   SpO2 95%   BMI 33.05 kg/m   General:  alert, cooperative, and no distress  Abdomen is soft, incisions healing well.  Staples removed.  JP drain removed.     Assessment:    Doing well postoperatively.    Plan:   Follow-up here as needed.  May resume normal activity.

## 2021-11-10 ENCOUNTER — Other Ambulatory Visit: Payer: Self-pay

## 2021-11-10 ENCOUNTER — Encounter (HOSPITAL_COMMUNITY): Payer: Self-pay | Admitting: Emergency Medicine

## 2021-11-10 ENCOUNTER — Emergency Department (HOSPITAL_COMMUNITY)
Admission: EM | Admit: 2021-11-10 | Discharge: 2021-11-10 | Disposition: A | Discharge status: Home / Self Care | Payer: No Typology Code available for payment source | Attending: Emergency Medicine | Admitting: Emergency Medicine

## 2021-11-10 ENCOUNTER — Emergency Department (HOSPITAL_COMMUNITY): Payer: No Typology Code available for payment source

## 2021-11-10 DIAGNOSIS — R519 Headache, unspecified: Secondary | ICD-10-CM | POA: Diagnosis not present

## 2021-11-10 DIAGNOSIS — S39012A Strain of muscle, fascia and tendon of lower back, initial encounter: Secondary | ICD-10-CM | POA: Insufficient documentation

## 2021-11-10 DIAGNOSIS — M542 Cervicalgia: Secondary | ICD-10-CM | POA: Diagnosis not present

## 2021-11-10 DIAGNOSIS — Y92410 Unspecified street and highway as the place of occurrence of the external cause: Secondary | ICD-10-CM | POA: Insufficient documentation

## 2021-11-10 DIAGNOSIS — S3992XA Unspecified injury of lower back, initial encounter: Secondary | ICD-10-CM | POA: Diagnosis present

## 2021-11-10 MED ORDER — METHOCARBAMOL 500 MG PO TABS: 500.0000 mg | ORAL_TABLET | Freq: Two times a day (BID) | ORAL | 0 refills | Status: AC

## 2021-11-10 MED ORDER — OXYCODONE-ACETAMINOPHEN 5-325 MG PO TABS
1.0000 | ORAL_TABLET | Freq: Once | ORAL | Status: AC
  Administered 2021-11-10: 1 via ORAL
  Filled 2021-11-10: qty 1

## 2021-11-10 MED ORDER — IBUPROFEN 400 MG PO TABS
600.0000 mg | ORAL_TABLET | Freq: Once | ORAL | Status: AC
  Administered 2021-11-10: 600 mg via ORAL
  Filled 2021-11-10: qty 2

## 2021-11-10 MED ORDER — IBUPROFEN 600 MG PO TABS: 600.0000 mg | ORAL_TABLET | Freq: Four times a day (QID) | ORAL | 0 refills | Status: DC | PRN
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
## 2021-11-27 ENCOUNTER — Encounter (HOSPITAL_COMMUNITY): Payer: Self-pay | Admitting: *Deleted

## 2021-11-27 ENCOUNTER — Emergency Department (HOSPITAL_COMMUNITY): Payer: No Typology Code available for payment source

## 2021-11-27 ENCOUNTER — Ambulatory Visit
Admission: EM | Admit: 2021-11-27 | Discharge: 2021-11-27 | Discharge status: Against Medical Advice | Payer: No Typology Code available for payment source

## 2021-11-27 ENCOUNTER — Emergency Department (HOSPITAL_COMMUNITY)
Admission: EM | Admit: 2021-11-27 | Discharge: 2021-11-27 | Disposition: A | Discharge status: Home / Self Care | Payer: No Typology Code available for payment source | Attending: Emergency Medicine | Admitting: Emergency Medicine

## 2021-11-27 ENCOUNTER — Other Ambulatory Visit: Payer: Self-pay

## 2021-11-27 DIAGNOSIS — M25561 Pain in right knee: Secondary | ICD-10-CM

## 2021-11-27 DIAGNOSIS — Y92410 Unspecified street and highway as the place of occurrence of the external cause: Secondary | ICD-10-CM | POA: Diagnosis not present

## 2021-11-27 DIAGNOSIS — Z79899 Other long term (current) drug therapy: Secondary | ICD-10-CM | POA: Diagnosis not present

## 2021-11-27 DIAGNOSIS — M1711 Unilateral primary osteoarthritis, right knee: Secondary | ICD-10-CM | POA: Diagnosis not present

## 2021-11-27 DIAGNOSIS — I10 Essential (primary) hypertension: Secondary | ICD-10-CM | POA: Diagnosis not present

## 2021-11-27 MED ORDER — HYDROCODONE-ACETAMINOPHEN 5-325 MG PO TABS
1.0000 | ORAL_TABLET | Freq: Once | ORAL | Status: AC
  Administered 2021-11-27: 1 via ORAL
  Filled 2021-11-27: qty 1

## 2021-11-27 MED ORDER — DICLOFENAC SODIUM 75 MG PO TBEC: 75.0000 mg | DELAYED_RELEASE_TABLET | Freq: Two times a day (BID) | ORAL | 0 refills | Status: AC
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
## 2021-12-16 ENCOUNTER — Other Ambulatory Visit: Payer: Self-pay

## 2021-12-16 ENCOUNTER — Encounter (HOSPITAL_COMMUNITY): Payer: Self-pay

## 2021-12-16 ENCOUNTER — Emergency Department (HOSPITAL_COMMUNITY)
Admission: EM | Admit: 2021-12-16 | Discharge: 2021-12-16 | Disposition: A | Discharge status: Home / Self Care | Payer: No Typology Code available for payment source | Attending: Emergency Medicine | Admitting: Emergency Medicine

## 2021-12-16 DIAGNOSIS — M25561 Pain in right knee: Secondary | ICD-10-CM

## 2021-12-16 DIAGNOSIS — M1990 Unspecified osteoarthritis, unspecified site: Secondary | ICD-10-CM

## 2021-12-16 DIAGNOSIS — M1711 Unilateral primary osteoarthritis, right knee: Secondary | ICD-10-CM | POA: Insufficient documentation

## 2021-12-16 MED ORDER — MELOXICAM 15 MG PO TABS: 15.0000 mg | ORAL_TABLET | Freq: Every day | ORAL | 2 refills | Status: AC

## 2021-12-16 MED ORDER — HYDROCODONE-ACETAMINOPHEN 5-325 MG PO TABS
1.0000 | ORAL_TABLET | Freq: Once | ORAL | Status: AC
  Administered 2021-12-16: 1 via ORAL
  Filled 2021-12-16: qty 1

## 2021-12-16 NOTE — Discharge Instructions (Addendum)
[facility]

## 2021-12-16 NOTE — ED Triage Notes (Signed)
Pt presents to ED with complaints of right knee pain started hurting worse this am

## 2021-12-16 NOTE — ED Provider Notes (Signed)
[facility] EMERGENCY DEPARTMENT Provider Note   CSN: [id number] Arrival date & time: 12/16/21  1418     History  Chief Complaint  Patient presents with   Knee Pain    Vicki Dean is a 61 y.o. female.  Pt complains of pain in her right knee.  Pt seen here by me 3 weeks ago for same.  [facility] and still having discomfort.  No relief with knee brace.   The history is provided by the patient. No language interpreter was used.  Knee Pain Location:  Knee Time since incident:  2 months Injury: no   Knee location:  R knee Pain details:    Quality:  Aching   Severity:  Moderate   Onset quality:  Gradual   Timing:  Constant   Progression:  Worsening      Home Medications Prior to Admission medications   Medication Sig Start Date End Date Taking? Authorizing Provider  meloxicam (MOBIC) 15 MG tablet Take 1 tablet (15 mg total) by mouth daily. 12/16/21 12/16/22 Yes Elson Areas, PA-C  acetaminophen (TYLENOL) 325 MG tablet Take 2 tablets (650 mg total) by mouth every 6 (six) hours as needed for mild pain (or Fever >/= 101). 02/26/21   Shon Hale, MD  albuterol (PROVENTIL HFA;VENTOLIN HFA) 108 (90 Base) MCG/ACT inhaler Inhale 1-2 puffs into the lungs every 6 (six) hours as needed for wheezing or shortness of breath.    [provider]  amLODipine (NORVASC) 10 MG tablet Take 1 tablet (10 mg total) by mouth daily. 02/28/21   Shon Hale, MD  budesonide-formoterol (SYMBICORT) 160-4.5 MCG/ACT inhaler Inhale 2 puffs into the lungs 2 (two) times daily.    [provider]  Cholecalciferol 25 MCG (1000 UT) tablet Take 2 tablets by mouth daily. 06/08/20   [provider]  diclofenac (VOLTAREN) 75 MG EC tablet Take 1 tablet (75 mg total) by mouth 2 (two) times daily. 11/27/21   Elson Areas, PA-C  famotidine (PEPCID) 20 MG tablet Take 1 tablet (20 mg total) by mouth 2 (two) times daily. 02/26/21 02/26/22  Shon Hale, MD   ferrous sulfate 325 (65 FE) MG EC tablet Take 1 tablet (325 mg total) by mouth 2 (two) times daily with a meal. 02/26/21   Emokpae, Courage, MD  gabapentin (NEURONTIN) 800 MG tablet Take 400 mg by mouth in the morning, at noon, and at bedtime. 06/08/20   [provider]  lidocaine (LIDODERM) 5 % Place 1 patch onto the skin daily as needed (pain). 04/20/20   [provider]  Lurasidone HCl 120 MG TABS Take 0.5 tablets by mouth at bedtime. 10/25/19   [provider]  ondansetron (ZOFRAN) 4 MG tablet Take 1 tablet (4 mg total) by mouth every 6 (six) hours as needed for nausea. 02/26/21   Shon Hale, MD  potassium chloride SA (KLOR-CON) 20 MEQ tablet Take 1 tablet (20 mEq total) by mouth 2 (two) times daily. 09/04/20   Rancour, Jeannett Senior, MD  senna-docusate (SENOKOT-S) 8.6-50 MG tablet Take 2 tablets by mouth at bedtime. 02/26/21 02/26/22  Shon Hale, MD  traZODone (DESYREL) 100 MG tablet Take 1 tablet (100 mg total) by mouth at bedtime. 02/26/21   Shon Hale, MD  venlafaxine XR (EFFEXOR-XR) 150 MG 24 hr capsule Take 2 capsules by mouth daily. 06/08/20   [provider]      Allergies    Doxepin, Lisinopril, Omeprazole, and Pneumococcal vaccines    Review  of Systems   Review of Systems  All other systems reviewed and are negative.   Physical Exam Updated Vital Signs BP (!) 154/78   Pulse 66   Temp 97.9 F (36.6 C) (Oral)   Resp 17   Ht 5\' 7"  (1.702 m)   Wt 84.9 kg   SpO2 95%   BMI 29.30 kg/m  Physical Exam Vitals and nursing note reviewed.  Constitutional:      Appearance: She is well-developed.  HENT:     Head: Normocephalic.  Pulmonary:     Effort: Pulmonary effort is normal.  Abdominal:     General: There is no distension.  Musculoskeletal:        General: Normal range of motion.     Cervical back: Normal range of motion.     Comments: Swollen arthritic appearing right knee,  pain with movement  nv and ns intact   Skin:     General: Skin is warm.  Neurological:     Mental Status: She is alert and oriented to person, place, and time.  Psychiatric:        Mood and Affect: Mood normal.     ED Results / Procedures / Treatments   Labs (all labs ordered are listed, but only abnormal results are displayed) Labs Reviewed - No data to display  EKG None  Radiology No results found.  Procedures Procedures    Medications Ordered in ED Medications  HYDROcodone-acetaminophen (NORCO/VICODIN) 5-325 MG per tablet 1 tablet (has no administration in time range)    ED Course/ Medical Decision Making/ A&P                           Medical Decision Making Risk Prescription drug management.           Final Clinical Impression(s) / ED Diagnoses Final diagnoses:  Acute pain of right knee  Osteoarthritis, unspecified osteoarthritis type, unspecified site    Rx / DC Orders ED Discharge Orders          Ordered    meloxicam (MOBIC) 15 MG tablet  Daily        12/16/21 1623           An After Visit Summary was printed and given to the patient.    12/18/21, PA-C 12/16/21 12/18/21    Eldridge Dace, MD 12/16/21 [phone]

## 2021-12-29 ENCOUNTER — Ambulatory Visit: Payer: No Typology Code available for payment source | Admitting: Orthopedic Surgery

## 2022-01-11 ENCOUNTER — Emergency Department (HOSPITAL_COMMUNITY): Payer: No Typology Code available for payment source

## 2022-01-11 ENCOUNTER — Observation Stay (HOSPITAL_COMMUNITY)
Admission: EM | Admit: 2022-01-11 | Discharge: 2022-01-12 | Disposition: A | Discharge status: Home / Self Care | Payer: No Typology Code available for payment source | Attending: Internal Medicine | Admitting: Internal Medicine

## 2022-01-11 DIAGNOSIS — F1410 Cocaine abuse, uncomplicated: Secondary | ICD-10-CM | POA: Insufficient documentation

## 2022-01-11 DIAGNOSIS — R079 Chest pain, unspecified: Secondary | ICD-10-CM

## 2022-01-11 DIAGNOSIS — R0789 Other chest pain: Principal | ICD-10-CM | POA: Insufficient documentation

## 2022-01-11 DIAGNOSIS — J449 Chronic obstructive pulmonary disease, unspecified: Secondary | ICD-10-CM | POA: Insufficient documentation

## 2022-01-11 DIAGNOSIS — I10 Essential (primary) hypertension: Secondary | ICD-10-CM | POA: Insufficient documentation

## 2022-01-11 DIAGNOSIS — R519 Headache, unspecified: Secondary | ICD-10-CM | POA: Diagnosis not present

## 2022-01-11 DIAGNOSIS — I160 Hypertensive urgency: Secondary | ICD-10-CM

## 2022-01-11 DIAGNOSIS — Z79899 Other long term (current) drug therapy: Secondary | ICD-10-CM | POA: Insufficient documentation

## 2022-01-11 DIAGNOSIS — Z87891 Personal history of nicotine dependence: Secondary | ICD-10-CM | POA: Diagnosis not present

## 2022-01-11 DIAGNOSIS — Z7951 Long term (current) use of inhaled steroids: Secondary | ICD-10-CM | POA: Diagnosis not present

## 2022-01-11 DIAGNOSIS — R0602 Shortness of breath: Secondary | ICD-10-CM | POA: Diagnosis present

## 2022-01-11 LAB — BASIC METABOLIC PANEL
Anion gap: 5 (ref 5–15)
BUN: 12 mg/dL (ref 8–23)
CO2: 27 mmol/L (ref 22–32)
Calcium: 8.7 mg/dL — ABNORMAL LOW (ref 8.9–10.3)
Chloride: 110 mmol/L (ref 98–111)
Creatinine, Ser: 0.8 mg/dL (ref 0.44–1.00)
GFR, Estimated: >60 mL/min (ref >60)
Glucose, Bld: 89 mg/dL (ref 70–99)
Potassium: 3.4 mmol/L — ABNORMAL LOW (ref 3.5–5.1)
Sodium: 142 mmol/L (ref 135–145)

## 2022-01-11 LAB — RAPID URINE DRUG SCREEN, HOSP PERFORMED
Amphetamines: NOT DETECTED
Barbiturates: NOT DETECTED
Benzodiazepines: NOT DETECTED
Cocaine: NOT DETECTED
Opiates: POSITIVE — AB
Tetrahydrocannabinol: NOT DETECTED

## 2022-01-11 LAB — CBC
HCT: 36.2 % (ref 36.0–46.0)
Hemoglobin: 11.4 g/dL — ABNORMAL LOW (ref 12.0–15.0)
MCH: 22.3 pg — ABNORMAL LOW (ref 26.0–34.0)
MCHC: 31.5 g/dL (ref 30.0–36.0)
MCV: 70.8 fL — ABNORMAL LOW (ref 80.0–100.0)
Platelets: 196 10*3/uL (ref 150–400)
RBC: 5.11 MIL/uL (ref 3.87–5.11)
RDW: 15.7 % — ABNORMAL HIGH (ref 11.5–15.5)
WBC: 5.4 10*3/uL (ref 4.0–10.5)
nRBC: 0 % (ref 0.0–0.2)

## 2022-01-11 LAB — TROPONIN I (HIGH SENSITIVITY)
Troponin I (High Sensitivity): 4 ng/L (ref <18)
Troponin I (High Sensitivity): 4 ng/L (ref <18)

## 2022-01-11 LAB — D-DIMER, QUANTITATIVE: D-Dimer, Quant: 0.58 ug/mL-FEU — ABNORMAL HIGH (ref 0.00–0.50)

## 2022-01-11 MED ORDER — HYDROCHLOROTHIAZIDE 12.5 MG PO TABS
12.5000 mg | ORAL_TABLET | Freq: Every day | ORAL | Status: DC
Start: 2022-01-11 — End: 2022-01-12
  Administered 2022-01-11 – 2022-01-12 (×2): 12.5 mg via ORAL
  Filled 2022-01-11 (×2): qty 1

## 2022-01-11 MED ORDER — ASPIRIN 81 MG PO CHEW
324.0000 mg | CHEWABLE_TABLET | Freq: Once | ORAL | Status: AC
  Administered 2022-01-11: 324 mg via ORAL
  Filled 2022-01-11: qty 4

## 2022-01-11 MED ORDER — ACETAMINOPHEN 325 MG PO TABS
650.0000 mg | ORAL_TABLET | Freq: Four times a day (QID) | ORAL | Status: DC | PRN
  Administered 2022-01-12 (×2): 650 mg via ORAL
  Filled 2022-01-11 (×2): qty 2

## 2022-01-11 MED ORDER — ATORVASTATIN CALCIUM 40 MG PO TABS
40.0000 mg | ORAL_TABLET | Freq: Every day | ORAL | Status: DC
  Administered 2022-01-11: 40 mg via ORAL
  Filled 2022-01-11: qty 1

## 2022-01-11 MED ORDER — HYDRALAZINE HCL 20 MG/ML IJ SOLN
10.0000 mg | Freq: Once | INTRAMUSCULAR | Status: AC
  Administered 2022-01-11: 10 mg via INTRAVENOUS
  Filled 2022-01-11: qty 1

## 2022-01-11 MED ORDER — ENOXAPARIN SODIUM 40 MG/0.4ML IJ SOSY
40.0000 mg | PREFILLED_SYRINGE | INTRAMUSCULAR | Status: DC
  Administered 2022-01-11: 40 mg via SUBCUTANEOUS
  Filled 2022-01-11: qty 0.4

## 2022-01-11 MED ORDER — ASPIRIN 81 MG PO TBEC
81.0000 mg | DELAYED_RELEASE_TABLET | Freq: Every day | ORAL | Status: DC
  Administered 2022-01-12: 81 mg via ORAL
  Filled 2022-01-11: qty 1

## 2022-01-11 MED ORDER — POLYETHYLENE GLYCOL 3350 17 G PO PACK: 17.0000 g | PACK | Freq: Every day | ORAL | Status: DC | PRN

## 2022-01-11 MED ORDER — HYDRALAZINE HCL 20 MG/ML IJ SOLN: 10.0000 mg | INTRAMUSCULAR | Status: DC | PRN

## 2022-01-11 MED ORDER — ONDANSETRON HCL 4 MG PO TABS: 4.0000 mg | ORAL_TABLET | Freq: Four times a day (QID) | ORAL | Status: DC | PRN

## 2022-01-11 MED ORDER — NITROGLYCERIN 0.4 MG SL SUBL
0.4000 mg | SUBLINGUAL_TABLET | SUBLINGUAL | Status: DC | PRN
  Administered 2022-01-11: 0.4 mg via SUBLINGUAL
  Filled 2022-01-11: qty 1

## 2022-01-11 MED ORDER — MORPHINE SULFATE (PF) 4 MG/ML IV SOLN
4.0000 mg | Freq: Once | INTRAVENOUS | Status: AC
  Administered 2022-01-11: 4 mg via INTRAVENOUS
  Filled 2022-01-11: qty 1

## 2022-01-11 MED ORDER — ONDANSETRON HCL 4 MG/2ML IJ SOLN: 4.0000 mg | Freq: Four times a day (QID) | INTRAMUSCULAR | Status: DC | PRN

## 2022-01-11 MED ORDER — AMLODIPINE BESYLATE 5 MG PO TABS
5.0000 mg | ORAL_TABLET | Freq: Every day | ORAL | Status: DC
Start: 2022-01-11 — End: 2022-01-12
  Administered 2022-01-11 – 2022-01-12 (×2): 5 mg via ORAL
  Filled 2022-01-11 (×2): qty 1

## 2022-01-11 MED ORDER — MORPHINE SULFATE (PF) 2 MG/ML IV SOLN: 2.0000 mg | INTRAVENOUS | Status: DC | PRN

## 2022-01-11 MED ORDER — MOMETASONE FURO-FORMOTEROL FUM 200-5 MCG/ACT IN AERO
2.0000 | INHALATION_SPRAY | Freq: Two times a day (BID) | RESPIRATORY_TRACT | Status: DC
  Administered 2022-01-11 – 2022-01-12 (×2): 2 via RESPIRATORY_TRACT
  Filled 2022-01-11: qty 8.8

## 2022-01-11 MED ORDER — ACETAMINOPHEN 650 MG RE SUPP: 650.0000 mg | Freq: Four times a day (QID) | RECTAL | Status: DC | PRN

## 2022-01-11 NOTE — ED Triage Notes (Addendum)
Pt to ED from [facility] c/o chest pain and SHOB that started this morning; constant in nature. HX:HTN Reports non compliance with bp medications , Reports off for months. Also reports hx drug abuse- crack/cocaine; last use 3 days ago,

## 2022-01-11 NOTE — ED Provider Notes (Signed)
[facility] EMERGENCY DEPARTMENT Provider Note   CSN: [id number] Arrival date & time: 01/11/22  1224     History  Chief Complaint  Patient presents with   Chest Pain   Shortness of Breath    Vicki Dean is a 61 y.o. female with a history including hypertension which she states has not been compliant with her blood pressure medication in months, history of chronic back pain, GERD, COPD, also polysubstance abuse including cocaine and alcohol, last cocaine use was 3 days ago, denies withdrawal symptoms with alcohol cessation presenting with chest pain which started around 10 AM this morning.  She was at her primary MDs office for routine exam, [facility] when she developed left-sided chest pain described as sharp stabs but associated with underlying pressure sensation, in addition has had  headache and shortness of breath.  She also endorses pain with deep inspiration.  Denies lower extremity edema or calf pain.  She has had no treatment prior to arrival.   The history is provided by the patient.       Home Medications Prior to Admission medications   Medication Sig Start Date End Date Taking? Authorizing Provider  acetaminophen (TYLENOL) 325 MG tablet Take 2 tablets (650 mg total) by mouth every 6 (six) hours as needed for mild pain (or Fever >/= 101). 02/26/21  Yes Emokpae, Courage, MD  meloxicam (MOBIC) 15 MG tablet Take 1 tablet (15 mg total) by mouth daily. 12/16/21 12/16/22 Yes Elson Areas, PA-C  albuterol (PROVENTIL HFA;VENTOLIN HFA) 108 (90 Base) MCG/ACT inhaler Inhale 1-2 puffs into the lungs every 6 (six) hours as needed for wheezing or shortness of breath. Patient not taking: Reported on 01/11/2022    [provider]  budesonide-formoterol (SYMBICORT) 160-4.5 MCG/ACT inhaler Inhale 2 puffs into the lungs 2 (two) times daily. Patient not taking: Reported on 01/11/2022    [provider]  diclofenac (VOLTAREN) 75 MG EC tablet Take 1 tablet (75 mg total) by  mouth 2 (two) times daily. Patient not taking: Reported on 01/11/2022 11/27/21   Elson Areas, PA-C  doxycycline (VIBRAMYCIN) 100 MG capsule Take 100 mg by mouth 2 (two) times daily. Patient not taking: Reported on 01/11/2022 08/26/21   [provider]      Allergies    Doxepin, Lisinopril, Omeprazole, and Pneumococcal vaccines    Review of Systems   Review of Systems  Constitutional:  Negative for chills and fever.  Eyes: Negative.   Cardiovascular:  Positive for chest pain. Negative for palpitations and leg swelling.  Gastrointestinal:  Negative for abdominal pain, nausea and vomiting.  Genitourinary: Negative.   Musculoskeletal:  Negative for arthralgias, joint swelling and neck pain.  Skin: Negative.  Negative for rash and wound.  Neurological:  Positive for headaches. Negative for dizziness, weakness, light-headedness and numbness.  Psychiatric/Behavioral: Negative.      Physical Exam Updated Vital Signs BP (!) 197/91   Pulse 65   Temp 98 F (36.7 C) (Oral)   Resp 16   Ht 5\' 7"  (1.702 m)   Wt 86.2 kg   SpO2 100%   BMI 29.76 kg/m  Physical Exam Vitals and nursing note reviewed.  Constitutional:      Appearance: She is well-developed.  HENT:     Head: Normocephalic and atraumatic.  Eyes:     Conjunctiva/sclera: Conjunctivae normal.  Neck:     Vascular: No JVD.  Cardiovascular:     Rate and Rhythm: Regular rhythm. Bradycardia present.  Heart sounds: Normal heart sounds.  Pulmonary:     Effort: Pulmonary effort is normal.     Breath sounds: Normal breath sounds. No wheezing.  Abdominal:     General: Bowel sounds are normal.     Palpations: Abdomen is soft.     Tenderness: There is no abdominal tenderness.  Musculoskeletal:        General: Normal range of motion.     Cervical back: Normal range of motion.  Skin:    General: Skin is warm and dry.  Neurological:     Mental Status: She is alert.     ED Results / Procedures / Treatments    Labs (all labs ordered are listed, but only abnormal results are displayed) Labs Reviewed  BASIC METABOLIC PANEL - Abnormal; Notable for the following components:      Result Value   Potassium 3.4 (*)    Calcium 8.7 (*)    All other components within normal limits  CBC - Abnormal; Notable for the following components:   Hemoglobin 11.4 (*)    MCV 70.8 (*)    MCH 22.3 (*)    RDW 15.7 (*)    All other components within normal limits  D-DIMER, QUANTITATIVE - Abnormal; Notable for the following components:   D-Dimer, Quant 0.58 (*)    All other components within normal limits  RAPID URINE DRUG SCREEN, HOSP PERFORMED  TROPONIN I (HIGH SENSITIVITY)  TROPONIN I (HIGH SENSITIVITY)    EKG EKG Interpretation  Date/Time:  Wednesday January 11 2022 12:36:06 EDT Ventricular Rate:  56 PR Interval:  138 QRS Duration: 88 QT Interval:  470 QTC Calculation: 453 R Axis:   55 Text Interpretation: Sinus bradycardia with sinus arrhythmia Nonspecific T wave abnormality Abnormal ECG When compared with ECG of 04-Sep-2020 01:56, PREVIOUS ECG IS PRESENT Confirmed by Bethann Berkshire [phone]) on 01/11/2022 2:20:56 PM  Radiology DG Chest Port 1 View  Result Date: 01/11/2022 CLINICAL DATA:  Chest pain and dyspnea EXAM: PORTABLE CHEST 1 VIEW COMPARISON:  Radiograph 09/04/2020 FINDINGS: Stable borderline enlargement of the cardiac silhouette. No focal consolidation, pleural effusion, or pneumothorax. No acute osseous abnormality. IMPRESSION: No active disease. Electronically Signed   By: Minerva Fester M.D.   On: 01/11/2022 13:25    Procedures Procedures    Medications Ordered in ED Medications  nitroGLYCERIN (NITROSTAT) SL tablet 0.4 mg (0.4 mg Sublingual Given 01/11/22 1510)  aspirin chewable tablet 324 mg (324 mg Oral Given 01/11/22 1509)  morphine (PF) 4 MG/ML injection 4 mg (4 mg Intravenous Given 01/11/22 1648)  hydrALAZINE (APRESOLINE) injection 10 mg (10 mg Intravenous Given 01/11/22 1648)    ED  Course/ Medical Decision Making/ A&P                           Medical Decision Making Heart score was employed to predict 6-week adverse cardiac event risk, she is at a moderate score of 4 points who would benefit from overnight observation for chest pain rule out.  She has had 2 negative delta troponins which is reassuring.  She did continue to have chest pain, although it was improved with sublingual nitroglycerin x1.  Additional nitroglycerin has been ordered.  She was also given 4 baby aspirin, morphine per IV.  Her blood pressure remains very elevated with a recent blood pressure of 200/101, for morphine also ordered for this patient.  She also continues to have a generalized headache which was present upon arrival, mild, but  worsened after given first ntg. She refused further ntg.  Hydralazine also given with improvement in bp, now 169/88.     Amount and/or Complexity of Data Reviewed Labs: ordered.    Details: As mentioned above she has had 2 negative delta troponins.  Her D-dimer is elevated at 0.58, age-adjusted appropriate at 0.62, doubt this represents a pulmonary embolism. Radiology: ordered.    Details: Chest x-ray is negative except for borderline enlargement of the cardiac silhouette which would certainly correspond with her history of noncompliance with her blood pressure medications. Discussion of management or test interpretation with external provider(s): Discussed with Dr. Mariea Clonts who accepts pt for admission.  Risk Decision regarding hospitalization.           Final Clinical Impression(s) / ED Diagnoses Final diagnoses:  Chest pain, unspecified type    Rx / DC Orders ED Discharge Orders     None         Victoriano Lain 01/11/22 1748    Bethann Berkshire, MD 01/12/22 1747

## 2022-01-11 NOTE — H&P (Signed)
History and Physical    Vicki Dean FSF:[id number] DOB: Oct 16, 1960 DOA: 01/11/2022  PCP: [facility]   Patient coming from: Home  I have personally briefly reviewed patient's old medical records in [facility]  Chief Complaint: chest pain  HPI: Vicki Dean is a 61 y.o. female with medical history significant for gangrenous cholecystitis, hypertension, COPD, sleep apnea, substance abuse. Patient presented to the ED with complaints of chest pain this morning that started while she was resting.  Chest pain has been ongoing for years, but she reports she has never had chest pain this severe.  She reports chest pain as burning, and radiates down her left arm.  She reports difficulty breathing with chest pain.  Reports vomiting a few days ago when she was having chest pain.  Unable to qualify exactly if she has had chest pain with activity, but she has had chest pain when she was using cocaine.  She is unaware of a family history of premature coronary artery disease.  She last used cocaine 3 days ago.  She is not compliant with her blood pressure medications, has not taken them in a long time unable to specify how long, and she does not know the names of her blood pressure medications. Patient also reports headache today, that worsened with nitro given in ED.  Patient went to the [place] today and was referred to the ED.  ED Course: Blood pressure elevated up to 221/92.  Troponin 4 X 2.  D-dimer 0.58.  Chest x-ray clear.  IV hydralazine 10 mg given with improvement in blood pressure.  Aspirin 324 and nitro SL given. Hospitalist to admit for chest pain.  Review of Systems: As per HPI all other systems reviewed and negative.  Past Medical History:  Diagnosis Date   Anxiety    Arthritis    possibly her right shoulder.   Chronic back pain    COPD (chronic obstructive pulmonary disease) (HCC)    Fibromyalgia    GERD (gastroesophageal reflux disease)    Hypertension    Sleep  apnea    could not tolerate    Past Surgical History:  Procedure Laterality Date   ABDOMINAL HYSTERECTOMY     CARPAL TUNNEL RELEASE Left    CHOLECYSTECTOMY N/A 02/25/2021   Procedure: LAPAROSCOPIC CHOLECYSTECTOMY;  Surgeon: Franky Macho, MD;  Location: [facility] ORS;  Service: General;  Laterality: N/A;   COLONOSCOPY WITH PROPOFOL N/A 08/23/2017   Dr. Jena Gauss, grade II hemorrhoids. next TCS in 10 years.    ESOPHAGOGASTRODUODENOSCOPY (EGD) WITH PROPOFOL N/A 08/23/2017   Dr. Jena Gauss: mild erosive reflux esophagitis s/p dilation due to h/o dysphagia.    FOOT SURGERY Right    screws from fracture   GANGLION CYST EXCISION Right    foot   KNEE SURGERY Right    arthroscopy   MALONEY DILATION N/A 08/23/2017   Procedure: Elease Hashimoto DILATION;  Surgeon: Corbin Ade, MD;  Location: [facility] END[facility] SUITE;  Service: Endoscopy;  Laterality: N/A;     reports that she quit smoking about 8 years ago. Her smoking use included cigarettes. She has a 20.00 pack-year smoking history. She has never used smokeless tobacco. She reports that she does not currently use alcohol. She reports that she does not currently use drugs after having used the following drugs: Marijuana.  Allergies  Allergen Reactions   Doxepin     Pt unsure of reaction   Lisinopril     cough   Omeprazole  Doesn't agree with her   Pneumococcal Vaccines     Arm swelling    Family History  Problem Relation Age of Onset   Hypertension Mother    Diabetes Mother    Arthritis Mother    Hypercholesterolemia Mother    Emphysema Father    Cancer Other    Diabetes Other    Colon cancer Neg Hx    Gastric cancer Neg Hx    Esophageal cancer Neg Hx     Prior to Admission medications   Medication Sig Start Date End Date Taking? Authorizing Provider  acetaminophen (TYLENOL) 325 MG tablet Take 2 tablets (650 mg total) by mouth every 6 (six) hours as needed for mild pain (or Fever >/= 101). 02/26/21  Yes Jovoni Borkenhagen, Courage, MD  meloxicam (MOBIC) 15 MG tablet  Take 1 tablet (15 mg total) by mouth daily. 12/16/21 12/16/22 Yes Elson Areas, PA-C  albuterol (PROVENTIL HFA;VENTOLIN HFA) 108 (90 Base) MCG/ACT inhaler Inhale 1-2 puffs into the lungs every 6 (six) hours as needed for wheezing or shortness of breath. Patient not taking: Reported on 01/11/2022    [provider]  budesonide-formoterol (SYMBICORT) 160-4.5 MCG/ACT inhaler Inhale 2 puffs into the lungs 2 (two) times daily. Patient not taking: Reported on 01/11/2022    [provider]  diclofenac (VOLTAREN) 75 MG EC tablet Take 1 tablet (75 mg total) by mouth 2 (two) times daily. Patient not taking: Reported on 01/11/2022 11/27/21   Elson Areas, PA-C  doxycycline (VIBRAMYCIN) 100 MG capsule Take 100 mg by mouth 2 (two) times daily. Patient not taking: Reported on 01/11/2022 08/26/21   [provider]    Physical Exam: Vitals:   01/11/22 1448 01/11/22 1604 01/11/22 1624 01/11/22 1646  BP: (!) 218/89 (!) 200/101  (!) 197/91  Pulse: 60 60  65  Resp: 15 20  16   Temp:   98 F (36.7 C)   TempSrc:   Oral   SpO2: 98% 100%  100%  Weight:      Height:        Constitutional: NAD, calm, comfortable Vitals:   01/11/22 1448 01/11/22 1604 01/11/22 1624 01/11/22 1646  BP: (!) 218/89 (!) 200/101  (!) 197/91  Pulse: 60 60  65  Resp: 15 20  16   Temp:   98 F (36.7 C)   TempSrc:   Oral   SpO2: 98% 100%  100%  Weight:      Height:       Eyes: PERRL, lids and conjunctivae normal ENMT: Mucous membranes are moist.  Neck: normal, supple, no masses, no thyromegaly Respiratory: clear to auscultation bilaterally, no wheezing, no crackles.  Cardiovascular: Left chest is quite tender to mild palpation, regular rate and rhythm, no murmurs / rubs / gallops. No extremity edema.  Lower extremities warm.   Abdomen: no tenderness, no masses palpated. No hepatosplenomegaly. Bowel sounds positive.  Musculoskeletal: no clubbing / cyanosis. No joint deformity upper and lower extremities.   Skin: no rashes, lesions, ulcers. No induration Neurologic: No apparent cranial nerve abnormality moving extremities spontaneously. Psychiatric: Normal judgment and insight. Alert and oriented x 3. Normal mood.   Labs on Admission: I have personally reviewed following labs and imaging studies  CBC: Recent Labs  Lab 01/11/22 1308  WBC 5.4  HGB 11.4*  HCT 36.2  MCV 70.8*  PLT 196   Basic Metabolic Panel: Recent Labs  Lab 01/11/22 1308  NA 142  K 3.4*  CL 110  CO2 27  GLUCOSE 89  BUN 12  CREATININE 0.80  CALCIUM 8.7*   Urine analysis:    Component Value Date/Time   COLORURINE AMBER (A) 02/22/2021 1946   APPEARANCEUR CLOUDY (A) 02/22/2021 1946   LABSPEC >1.030 (H) 02/22/2021 1946   PHURINE 5.0 02/22/2021 1946   GLUCOSEU 100 (A) 02/22/2021 1946   HGBUR NEGATIVE 02/22/2021 1946   BILIRUBINUR MODERATE (A) 02/22/2021 1946   KETONESUR 15 (A) 02/22/2021 1946   PROTEINUR 100 (A) 02/22/2021 1946   UROBILINOGEN 0.2 03/15/2010 2131   NITRITE POSITIVE (A) 02/22/2021 1946   LEUKOCYTESUR TRACE (A) 02/22/2021 1946    Radiological Exams on Admission: DG Chest Port 1 View  Result Date: 01/11/2022 CLINICAL DATA:  Chest pain and dyspnea EXAM: PORTABLE CHEST 1 VIEW COMPARISON:  Radiograph 09/04/2020 FINDINGS: Stable borderline enlargement of the cardiac silhouette. No focal consolidation, pleural effusion, or pneumothorax. No acute osseous abnormality. IMPRESSION: No active disease. Electronically Signed   By: Minerva Fester M.D.   On: 01/11/2022 13:25    EKG: Independently reviewed.  Sinus rhythm rate 56, QTc 453.  No ST or T wave changes ans is unchanged from prior.  Assessment/Plan Principal Problem:   Chest pain Active Problems:   Essential hypertension   Cocaine abuse (HCC)   COPD (chronic obstructive pulmonary disease) (HCC)   Hypertensive urgency   Assessment and Plan: * Chest pain High risk for coronary artery disease with cocaine abuse.  Otherwise chest pain has  mostly atypical features. Also left chest is quite tender to palpation.  D-dimer 0.58, adjusted for age is WNL. EKG troponin so far unremarkable.  Refused further nitro as it worsened her headache. -Recheck EKG in a.m. -Obtain echo -UDS-positive for opiates which she has gotten in the ED, negative for cocaine - Cardiology to see -N.p.o. midnight -IV morphine 2 mg as needed - Will start Statins -Aspirin 324 mg given, continue 81 mg daily -Lipid panel in a.m.  Cocaine abuse (HCC) Last cocaine use 3 days ago, UDS negative today.  Hypertensive urgency Presenting with headache.  Blood pressure markedly elevated up to 221/92, improved with IV hydralazine 10 mg currently 144/82.  Not compliant with her home antihypertensives.  Does not know the name of what she supposed to be taking, and no antihypertensive on home med list.  - IV hydralazine 10 mg as needed for systolic greater than 180. -Will start Norvasc 5 mg daily -HCTZ 12.5 mg daily -Obtain head CT   COPD (chronic obstructive pulmonary disease) (HCC) Stable. -Resume home regimen    DVT prophylaxis: Lovenox Code Status: Full Family Communication: None at bedside Disposition Plan: ~ 1- 2 days Consults called: Cards Admission status:  Obs tele  Author: Onnie Boer, MD 01/11/2022 8:19 PM  For on call review [email or web address].

## 2022-01-11 NOTE — Assessment & Plan Note (Addendum)
High risk for coronary artery disease with cocaine abuse.  Otherwise chest pain has mostly atypical features. Also left chest is quite tender to palpation.  D-dimer 0.58, adjusted for age is WNL. EKG troponin so far unremarkable.  Refused further nitro as it worsened her headache. -Recheck EKG in a.m. -Obtain echo -UDS-positive for opiates which she has gotten in the ED, negative for cocaine - Cardiology to see -N.p.o. midnight -IV morphine 2 mg as needed - Will start Statins -Aspirin 324 mg given, continue 81 mg daily -Lipid panel in a.m.

## 2022-01-11 NOTE — Assessment & Plan Note (Addendum)
Last cocaine use 3 days ago, UDS negative today.

## 2022-01-11 NOTE — Assessment & Plan Note (Deleted)
Presenting with headache.  Blood pressure markedly elevated up to 221/92, improved with IV hydralazine 10 mg currently 144/82.  Not compliant with her home antihypertensives.  Does not know the name of what she supposed to be taking, and no antihypertensive on home med list.  - IV hydralazine 10 mg as needed for systolic greater than 180. -Will start Norvasc 5 mg daily -HCTZ 12.5 mg daily -Obtain head CT  

## 2022-01-11 NOTE — Assessment & Plan Note (Signed)
Presenting with headache.  Blood pressure markedly elevated up to 221/92, improved with IV hydralazine 10 mg currently 144/82.  Not compliant with her home antihypertensives.  Does not know the name of what she supposed to be taking, and no antihypertensive on home med list.  - IV hydralazine 10 mg as needed for systolic greater than 180. -Will start Norvasc 5 mg daily -HCTZ 12.5 mg daily -Obtain head CT

## 2022-01-11 NOTE — Assessment & Plan Note (Signed)
Stable.  Resume home regimen 

## 2022-01-12 ENCOUNTER — Observation Stay (HOSPITAL_COMMUNITY): Payer: No Typology Code available for payment source

## 2022-01-12 ENCOUNTER — Observation Stay (HOSPITAL_BASED_OUTPATIENT_CLINIC_OR_DEPARTMENT_OTHER): Payer: No Typology Code available for payment source

## 2022-01-12 DIAGNOSIS — I10 Essential (primary) hypertension: Secondary | ICD-10-CM | POA: Diagnosis not present

## 2022-01-12 DIAGNOSIS — R079 Chest pain, unspecified: Secondary | ICD-10-CM

## 2022-01-12 DIAGNOSIS — F1410 Cocaine abuse, uncomplicated: Secondary | ICD-10-CM | POA: Diagnosis not present

## 2022-01-12 DIAGNOSIS — I160 Hypertensive urgency: Secondary | ICD-10-CM | POA: Diagnosis not present

## 2022-01-12 DIAGNOSIS — J449 Chronic obstructive pulmonary disease, unspecified: Secondary | ICD-10-CM | POA: Diagnosis not present

## 2022-01-12 DIAGNOSIS — Z7951 Long term (current) use of inhaled steroids: Secondary | ICD-10-CM | POA: Diagnosis not present

## 2022-01-12 DIAGNOSIS — R0789 Other chest pain: Secondary | ICD-10-CM | POA: Diagnosis not present

## 2022-01-12 LAB — LIPID PANEL
Cholesterol: 198 mg/dL (ref 0–200)
HDL: 84 mg/dL (ref >40)
LDL Cholesterol: 105 mg/dL — ABNORMAL HIGH (ref 0–99)
Total CHOL/HDL Ratio: 2.4 RATIO
Triglycerides: 44 mg/dL (ref <150)
VLDL: 9 mg/dL (ref 0–40)

## 2022-01-12 LAB — ECHOCARDIOGRAM COMPLETE
AR max vel: 2.71 cm2
AV Area VTI: 3 cm2
AV Area mean vel: 2.99 cm2
AV Mean grad: 3 mmHg
AV Peak grad: 6.7 mmHg
Ao pk vel: 1.29 m/s
Area-P 1/2: 2.59 cm2
Calc EF: 53.6 %
Height: 67 in
MV VTI: 2.2 cm2
S' Lateral: 3.2 cm
Single Plane A2C EF: 54.7 %
Single Plane A4C EF: 52.2 %
Weight: 3040 oz

## 2022-01-12 MED ORDER — AMLODIPINE BESYLATE 5 MG PO TABS: 5.0000 mg | ORAL_TABLET | Freq: Every day | ORAL | 0 refills | Status: DC

## 2022-01-12 MED ORDER — HYDROCHLOROTHIAZIDE 12.5 MG PO TABS: 12.5000 mg | ORAL_TABLET | Freq: Every day | ORAL | 0 refills | Status: DC

## 2022-01-12 NOTE — Consult Note (Addendum)
Cardiology Consultation:   Patient ID: Vicki Dean MRN: [id number]; DOB: 13-Jun-1961  Admit date: 01/11/2022 Date of Consult: 01/12/2022  PCP:  [facility]   [facility] Providers Cardiologist: New to [facility]  Patient Profile:   Vicki Dean is a 61 y.o. female with a hx of HTN, COPD, OSA, Fibromyalgia, gangrenous cholecystitis (s/p lap cholecystectomy in 02/2021) and substance abuse who is being seen 01/12/2022 for the evaluation of chest pain at the request of Dr. Mariea Clonts.  History of Present Illness:   Vicki Dean presented to [facility] ED on 01/11/2022 for evaluation of chest pain which started earlier in the morning. She reported having not taken her blood pressure medications for several months and also reported last using cocaine 3 days prior to arrival.  In talking with the patient today, she reports a history of substance abuse since her teenage years and says she routinely uses crack cocaine and develops chest pain while using. Says her heart races and she feels discomfort afterwards. Her current chest pain has been present for 2 days and is not worse with exertion or positional changes. No recent orthopnea, PND or pitting edema. She does have baseline dyspnea on exertion which has been unchanged. Says she stopped her BP medications several months ago for unclear reasons but is now open to taking them as she develops significant headaches when her BP is elevated. She is unaware of any personal history of CAD, CHF or cardiac arrhythmias. Reports a family history of cancer but no known cardiac issues.   BP was initially elevated at 221/92 upon arrival.  Initial labs showed WBC 5.4, Hgb 11.4, platelets 196, Na+ 142, K+ 3.4 and creatinine 0.80. D-dimer elevated to 0.58.  Initial and repeat troponin negative at 4.  UDS negative for cocaine but positive for opiates (received morphine in the ED). CXR showed no acute cardiopulmonary disease. CT Head showed no acute intracranial  abnormalities.  EKG shows sinus bradycardia, heart rate 56 with no acute ST abnormalities.   Past Medical History:  Diagnosis Date   Anxiety    Arthritis    possibly her right shoulder.   Chronic back pain    COPD (chronic obstructive pulmonary disease) (HCC)    Fibromyalgia    GERD (gastroesophageal reflux disease)    Hypertension    Sleep apnea    could not tolerate    Past Surgical History:  Procedure Laterality Date   ABDOMINAL HYSTERECTOMY     CARPAL TUNNEL RELEASE Left    CHOLECYSTECTOMY N/A 02/25/2021   Procedure: LAPAROSCOPIC CHOLECYSTECTOMY;  Surgeon: Franky Macho, MD;  Location: [facility];  Service: General;  Laterality: N/A;   COLONOSCOPY WITH PROPOFOL N/A 08/23/2017   Dr. Jena Gauss, grade II hemorrhoids. next TCS in 10 years.    ESOPHAGOGASTRODUODENOSCOPY (EGD) WITH PROPOFOL N/A 08/23/2017   Dr. Jena Gauss: mild erosive reflux esophagitis s/p dilation due to h/o dysphagia.    FOOT SURGERY Right    screws from fracture   GANGLION CYST EXCISION Right    foot   KNEE SURGERY Right    arthroscopy   MALONEY DILATION N/A 08/23/2017   Procedure: Elease Hashimoto DILATION;  Surgeon: Corbin Ade, MD;  Location: [facility];  Service: Endoscopy;  Laterality: N/A;     Home Medications:  Prior to Admission medications   Medication Sig Start Date End Date Taking? Authorizing Provider  acetaminophen (TYLENOL) 325 MG tablet Take 2 tablets (650 mg total) by mouth every 6 (six) hours as needed for mild  pain (or Fever >/= 101). 02/26/21  Yes Emokpae, Courage, MD  meloxicam (MOBIC) 15 MG tablet Take 1 tablet (15 mg total) by mouth daily. 12/16/21 12/16/22 Yes Elson Areas, PA-C  albuterol (PROVENTIL HFA;VENTOLIN HFA) 108 (90 Base) MCG/ACT inhaler Inhale 1-2 puffs into the lungs every 6 (six) hours as needed for wheezing or shortness of breath. Patient not taking: Reported on 01/11/2022    [provider]  budesonide-formoterol (SYMBICORT) 160-4.5 MCG/ACT inhaler Inhale 2 puffs into the lungs 2  (two) times daily. Patient not taking: Reported on 01/11/2022    [provider]  diclofenac (VOLTAREN) 75 MG EC tablet Take 1 tablet (75 mg total) by mouth 2 (two) times daily. Patient not taking: Reported on 01/11/2022 11/27/21   Elson Areas, PA-C  doxycycline (VIBRAMYCIN) 100 MG capsule Take 100 mg by mouth 2 (two) times daily. Patient not taking: Reported on 01/11/2022 08/26/21   [provider]    Inpatient Medications: Scheduled Meds:  amLODipine  5 mg Oral Daily   aspirin EC  81 mg Oral Daily   atorvastatin  40 mg Oral Daily   enoxaparin (LOVENOX) injection  40 mg Subcutaneous Q24H   hydrochlorothiazide  12.5 mg Oral Daily   mometasone-formoterol  2 puff Inhalation BID   Continuous Infusions:  PRN Meds: acetaminophen **OR** acetaminophen, hydrALAZINE, morphine injection, nitroGLYCERIN, ondansetron **OR** ondansetron (ZOFRAN) IV, polyethylene glycol  Allergies:    Allergies  Allergen Reactions   Doxepin     Pt unsure of reaction   Lisinopril     cough   Omeprazole     Doesn't agree with her   Pneumococcal Vaccines     Arm swelling    Social History:   Social History   Socioeconomic History   Marital status: Single    Spouse name: Not on file   Number of children: Not on file   Years of education: Not on file   Highest education level: Not on file  Occupational History   Not on file  Tobacco Use   Smoking status: Former    Packs/day: 1.00    Years: 20.00    Total pack years: 20.00    Types: Cigarettes    Quit date: 08/20/2013    Years since quitting: 8.4   Smokeless tobacco: Never  Vaping Use   Vaping Use: Never used  Substance and Sexual Activity   Alcohol use: Not Currently    Comment: in the past   Drug use: Not Currently    Types: Marijuana    Comment: denied 05/05/19   Sexual activity: Yes    Birth control/protection: Surgical  Other Topics Concern   Not on file  Social History Narrative   Not on file   Social Determinants  of Health   Financial Resource Strain: Not on file  Food Insecurity: Not on file  Transportation Needs: Not on file  Physical Activity: Not on file  Stress: Not on file  Social Connections: Not on file  Intimate Partner Violence: Not on file    Family History:    Family History  Problem Relation Age of Onset   Hypertension Mother    Diabetes Mother    Arthritis Mother    Hypercholesterolemia Mother    Emphysema Father    Cancer Other    Diabetes Other    Colon cancer Neg Hx    Gastric cancer Neg Hx    Esophageal cancer Neg Hx      ROS:  Please see the history  of present illness.   All other ROS reviewed and negative.     Physical Exam/Data:   Vitals:   01/12/22 0726 01/12/22 0827 01/12/22 0914 01/12/22 0918  BP: (!) 179/75  (!) 168/96 (!) 168/96  Pulse: (!) 52  65   Resp:      Temp:      TempSrc:      SpO2: 100% 99%    Weight:      Height:       No intake or output data in the 24 hours ending 01/12/22 0947    01/11/2022   12:33 PM 12/16/2021    2:22 PM 11/27/2021   12:04 PM  Last 3 Weights  Weight (lbs) 190 lb 187 lb 1.6 oz 183 lb  Weight (kg) 86.183 kg 84.868 kg 83.008 kg     Body mass index is 29.76 kg/m.  General:  Well nourished, well developed female appearing in no acute distress HEENT: normal Neck: no JVD Vascular: No carotid bruits; Distal pulses 2+ bilaterally Cardiac:  normal S1, S2; RRR; no murmur  Lungs:  clear to auscultation bilaterally, no wheezing, rhonchi or rales  Abd: soft, nontender, no hepatomegaly  Ext: no pitting edema Musculoskeletal:  No deformities, BUE and BLE strength normal and equal Skin: warm and dry  Neuro:  CNs 2-12 intact, no focal abnormalities noted Psych:  Normal affect   EKG:  The EKG was personally reviewed and demonstrates: Sinus bradycardia, heart rate 56 with no acute ST abnormalities.  Telemetry:  Telemetry was personally reviewed and demonstrates: Sinus bradycardia, heart rate in 50's to 60's.  Relevant  CV Studies:  Echocardiogram: Pending  Laboratory Data:  High Sensitivity Troponin:   Recent Labs  Lab 01/11/22 1308 01/11/22 1507  TROPONINIHS 4 4     Chemistry Recent Labs  Lab 01/11/22 1308  NA 142  K 3.4*  CL 110  CO2 27  GLUCOSE 89  BUN 12  CREATININE 0.80  CALCIUM 8.7*  GFRNONAA >60  ANIONGAP 5    No results for input(s): "PROT", "ALBUMIN", "AST", "ALT", "ALKPHOS", "BILITOT" in the last 168 hours. Lipids  Recent Labs  Lab 01/12/22 0611  CHOL 198  TRIG 44  HDL 84  LDLCALC 105*  CHOLHDL 2.4    Hematology Recent Labs  Lab 01/11/22 1308  WBC 5.4  RBC 5.11  HGB 11.4*  HCT 36.2  MCV 70.8*  MCH 22.3*  MCHC 31.5  RDW 15.7*  PLT 196   Thyroid No results for input(s): "TSH", "FREET4" in the last 168 hours.  BNPNo results for input(s): "BNP", "PROBNP" in the last 168 hours.  DDimer  Recent Labs  Lab 01/11/22 1520  DDIMER 0.58*     Radiology/Studies:  CT HEAD WO CONTRAST ( )  Result Date: 01/12/2022 CLINICAL DATA:  61 year old female with chest pain, shortness of breath, hypertension, headaches. EXAM: CT HEAD WITHOUT CONTRAST TECHNIQUE: Contiguous axial images were obtained from the base of the skull through the vertex without intravenous contrast. RADIATION DOSE REDUCTION: This exam was performed according to the departmental dose-optimization program which includes automated exposure control, adjustment of the mA and/or kV according to patient size and/or use of iterative reconstruction technique. COMPARISON:  Head CT 11/10/2021 and earlier. FINDINGS: Brain: Stable asymmetric vascular calcifications at the basal ganglia. Normal cerebral volume. No midline shift, ventriculomegaly, mass effect, evidence of mass lesion, intracranial hemorrhage or evidence of cortically based acute infarction. Gray-white matter differentiation is stable since May. There is patchy mild for age bilateral white matter hypodensity.  No cortical encephalomalacia identified.  Vascular: Mild Calcified atherosclerosis at the skull base. No suspicious intracranial vascular hyperdensity. Skull: Stable, negative. Sinuses/Orbits: Visualized paranasal sinuses and mastoids are stable and well aerated. Other: Visualized orbits and scalp soft tissues are within normal limits. IMPRESSION: No acute intracranial abnormality. Stable and negative for age non contrast CT appearance of the brain. Electronically Signed   By: Odessa FlemingH  Hall M.D.   On: 01/12/2022 05:44   DG Chest Port 1 View  Result Date: 01/11/2022 CLINICAL DATA:  Chest pain and dyspnea EXAM: PORTABLE CHEST 1 VIEW COMPARISON:  Radiograph 09/04/2020 FINDINGS: Stable borderline enlargement of the cardiac silhouette. No focal consolidation, pleural effusion, or pneumothorax. No acute osseous abnormality. IMPRESSION: No active disease. Electronically Signed   By: Minerva Festeryler  Stutzman M.D.   On: 01/11/2022 13:25     Assessment and Plan:   1. Atypical Chest Pain - She presents with chest pain which has been constant for over 2 days and is not worse with exertion or positional changes. Tender to palpation along her sternal region. Troponin values have been negative at 4 and EKG is without acute ST changes. - She is at risk for cocaine induced vasospasms and we reviewed the importance of cessation. An echocardiogram is pending for assessment of any structural abnormalities. If this is reassuring, would not anticipate further ischemic evaluation this admission. If found to have a cardiomyopathy, would focus on medical therapy until compliance with follow-up can be established.  2. Hypertensive Urgency - BP was initially elevated to 221/92 upon arrival to the Emergency Department and she has been without blood pressure medications for several months. She has been started on Amlodipine 5 mg daily and HCTZ 12.5 mg daily by the admitting team which can be titrated as needed. If she is found to have a cardiomyopathy, would focus on adding GDMT.   3.  COPD/OSA - She does not use her inhalers routinely and was previously intolerant to CPAP.  4. Substance Abuse - She reports using crack cocaine since she was a teenager. Still uses routinely and has been to rehab multiple times but unsuccessful. The importance of cessation was reviewed.     For questions or updates, please contact [facility] Please consult [email or web address] for contact info under    Signed, Ellsworth LennoxBrittany M Strader, PA-C  01/12/2022 9:47 AM   Patient seen and examined.  Agree with above documentation.  Vicki Dean is a 61 year old female with a history of COPD, OSA, fibromyalgia, hypertension, ongoing cocaine abuse who we are consulted by Dr. Mariea ClontsEmokpae for evaluation of chest pain.  She reports she last used cocaine 3 days prior to admission.  Reports she regularly uses cocaine, and has since she was 61 years old.  Reports intermittent chest pain when using.  Her current chest pain she reports is different than this however.  Reports left-sided chest pain that has been continuous and is very tender.  In the ED, initial vital signs notable for BP 221/92, SPO2 99% on room air, pulse 51.  Labs notable for creatinine 0.80, potassium 3.4, troponin 4 > 4, hemoglobin 11.4, platelets 196, WBC 5.4, D-dimer 0.58.  EKG shows sinus bradycardia, rate 56, nonspecific T wave flattening.  On exam, patient is alert and oriented, regular rate and rhythm, no murmurs, lungs CTAB, no LE edema or JVD.  For chest pain, given continuous pain and negative troponins, suspect noncardiac pain.  She is very tender to palpation, suspect musculoskeletal pain.  Will check echocardiogram.  If unremarkable, no further  cardiac work-up recommended at this time.  For hypertensive urgency, BP is improved but remains elevated.  She has been started on amlodipine and HCTZ.  Can continue these medications for now, but if echocardiogram shows reduced EF, will plan to switch to GDMT.    Little Ishikawa, MD

## 2022-01-12 NOTE — Discharge Summary (Signed)
Physician Discharge Summary  TAHJ LINDSETH OBS:[id number] DOB: [birth year] DOA: 01/11/2022  PCP: [facility]  Admit date: 01/11/2022  Discharge date: 01/12/2022  Admitted From:Home  Disposition:  Home  Recommendations for Outpatient Follow-up:  Follow up with PCP at the [facility] in 1-2 weeks Continue on amlodipine and HCTZ as prescribed for improved blood pressure control Counseled on cessation of cocaine use Continue other home medications as prior  Home Health: None  Equipment/Devices: None  Discharge Condition:Stable  CODE STATUS: Full  Diet recommendation: Heart Healthy  Brief/Interim Summary: Vicki Dean is a 61 y.o. female with medical history significant for gangrenous cholecystitis, hypertension, COPD, sleep apnea, substance abuse. Patient presented to the ED with complaints of chest pain this morning that started while she was resting.  Chest pain has been ongoing for years, but she reports she has never had chest pain this severe.  She was admitted for atypical chest pain evaluation as well as hypertensive urgency in the setting of cocaine abuse.  She was seen by cardiology and troponins and EKG were within normal limits.  She was thought to have more musculoskeletal related pain as she had chest wall tenderness to palpation.  She underwent 2D echocardiogram with no wall motion abnormalities and preserved LVEF of 60-65% and grade 1 diastolic dysfunction noted.  As a result of these findings, no further inpatient work-up is recommended and she has been started on amlodipine and HCTZ for improved blood pressure control.  She is currently in stable condition for discharge and should follow-up with her providers through the [place].  No other acute events noted.  Discharge Diagnoses:  Principal Problem:   Chest pain Active Problems:   Essential hypertension   Cocaine abuse (HCC)   COPD (chronic obstructive pulmonary disease) (HCC)   Hypertensive urgency  Principal  discharge diagnosis: Atypical chest pain-likely musculoskeletal.  Hypertensive urgency.  Cocaine use.  Discharge Instructions  Discharge Instructions     Diet - low sodium heart healthy   Complete by: As directed    Increase activity slowly   Complete by: As directed       Allergies as of 01/12/2022       Reactions   Doxepin    Pt unsure of reaction   Lisinopril    cough   Omeprazole    Doesn't agree with her   Pneumococcal Vaccines    Arm swelling        Medication List     TAKE these medications    acetaminophen 325 MG tablet Commonly known as: TYLENOL Take 2 tablets (650 mg total) by mouth every 6 (six) hours as needed for mild pain (or Fever >/= 101).   albuterol 108 (90 Base) MCG/ACT inhaler Commonly known as: VENTOLIN HFA Inhale 1-2 puffs into the lungs every 6 (six) hours as needed for wheezing or shortness of breath.   amLODipine 5 MG tablet Commonly known as: NORVASC Take 1 tablet (5 mg total) by mouth daily. Start taking on: January 13, 2022   budesonide-formoterol 160-4.5 MCG/ACT inhaler Commonly known as: SYMBICORT Inhale 2 puffs into the lungs 2 (two) times daily.   diclofenac 75 MG EC tablet Commonly known as: VOLTAREN Take 1 tablet (75 mg total) by mouth 2 (two) times daily.   doxycycline 100 MG capsule Commonly known as: VIBRAMYCIN Take 100 mg by mouth 2 (two) times daily.   hydrochlorothiazide 12.5 MG tablet Commonly known as: HYDRODIURIL Take 1 tablet (12.5 mg total) by mouth daily. Start taking on:  January 13, 2022   meloxicam 15 MG tablet Commonly known as: MOBIC Take 1 tablet (15 mg total) by mouth daily.        Follow-up Information     [facility]. Schedule an appointment as soon as possible for a visit in 1 week(s).   Contact information: [address] [place] [id number] [phone]                Allergies  Allergen Reactions   Doxepin     Pt unsure of reaction   Lisinopril     cough    Omeprazole     Doesn't agree with her   Pneumococcal Vaccines     Arm swelling    Consultations: Cardiology   Procedures/Studies: ECHOCARDIOGRAM COMPLETE  Result Date: 01/12/2022    ECHOCARDIOGRAM REPORT   Patient Name:   Vicki LodgeSHARON M Dean Date of Exam: 01/12/2022 Medical Rec #:  [id number]      Height:       67.0 in Accession #:    [id number]     Weight:       190.0 lb Date of Birth:  1961-03-26      BSA:          1.979 m Patient Age:    61 years       BP:           179/75 mmHg Patient Gender: F              HR:           53 bpm. [facility] Procedure: 2D Echo, Cardiac Doppler and Color Doppler Indications:    Chest Pain  History:        Patient has no prior history of Echocardiogram examinations.                 COPD, Signs/Symptoms:Chest Pain; Risk Factors:Hypertension.  Sonographer:    Mikki Harbororothy Buchanan Referring Phys: [phone] Heloise BeechamEJIROGHENE E EMOKPAE IMPRESSIONS  1. Left ventricular ejection fraction, by estimation, is 60 to 65%. The left ventricle has normal function. The left ventricle has no regional wall motion abnormalities. There is moderate asymmetric left ventricular hypertrophy of the basal-septal segment. Left ventricular diastolic parameters are consistent with Grade I diastolic dysfunction (impaired relaxation).  2. Right ventricular systolic function is normal. The right ventricular size is normal. Tricuspid regurgitation signal is inadequate for assessing PA pressure.  3. The mitral valve is normal in structure. Trivial mitral valve regurgitation. No evidence of mitral stenosis.  4. The aortic valve is tricuspid. Aortic valve regurgitation is not visualized. No aortic stenosis is present.  5. The inferior vena cava is normal in size with greater than 50% respiratory variability, suggesting right atrial pressure of 3 mmHg. FINDINGS  Left Ventricle: Left ventricular ejection fraction, by estimation, is 60 to 65%. The left ventricle has normal function. The left ventricle has no  regional wall motion abnormalities. The left ventricular internal cavity size was normal in size. There is  moderate asymmetric left ventricular hypertrophy of the basal-septal segment. Left ventricular diastolic parameters are consistent with Grade I diastolic dysfunction (impaired relaxation). Right Ventricle: The right ventricular size is normal. No increase in right ventricular wall thickness. Right ventricular systolic function is normal. Tricuspid regurgitation signal is inadequate for assessing PA pressure. Left Atrium: Left atrial size was normal in size. Right Atrium: Right atrial size was normal in size. Pericardium: There is no evidence of pericardial effusion. Mitral Valve: The mitral valve  is normal in structure. Trivial mitral valve regurgitation. No evidence of mitral valve stenosis. MV peak gradient, 4.7 mmHg. The mean mitral valve gradient is 1.0 mmHg. Tricuspid Valve: The tricuspid valve is normal in structure. Tricuspid valve regurgitation is trivial. Aortic Valve: The aortic valve is tricuspid. Aortic valve regurgitation is not visualized. No aortic stenosis is present. Aortic valve mean gradient measures 3.0 mmHg. Aortic valve peak gradient measures 6.7 mmHg. Aortic valve area, by VTI measures 3.00 cm. Pulmonic Valve: The pulmonic valve was not well visualized. Pulmonic valve regurgitation is not visualized. Aorta: The aortic root is normal in size and structure. Venous: The inferior vena cava is normal in size with greater than 50% respiratory variability, suggesting right atrial pressure of 3 mmHg. IAS/Shunts: The interatrial septum was not well visualized.  LEFT VENTRICLE PLAX 2D LVIDd:         5.00 cm     Diastology LVIDs:         3.20 cm     LV e' medial:    5.87 cm/s LV PW:         1.20 cm     LV E/e' medial:  11.0 LV IVS:        1.30 cm     LV e' lateral:   5.55 cm/s LVOT diam:     2.10 cm     LV E/e' lateral: 11.7 LV SV:         78 LV SV Index:   40 LVOT Area:     3.46 cm  LV Volumes  (MOD) LV vol d, MOD A2C: 66.4 ml LV vol d, MOD A4C: 62.5 ml LV vol s, MOD A2C: 30.1 ml LV vol s, MOD A4C: 29.9 ml LV SV MOD A2C:     36.3 ml LV SV MOD A4C:     62.5 ml LV SV MOD BP:      35.9 ml RIGHT VENTRICLE RV Basal diam:  3.60 cm RV Mid diam:    3.10 cm RV S prime:     14.80 cm/s TAPSE (M-mode): 2.7 cm LEFT ATRIUM             Index        RIGHT ATRIUM           Index LA diam:        3.90 cm 1.97 cm/m   RA Area:     17.50 cm LA Vol (A2C):   66.1 ml 33.41 ml/m  RA Volume:   50.00 ml  25.27 ml/m LA Vol (A4C):   47.3 ml 23.91 ml/m LA Biplane Vol: 57.6 ml 29.11 ml/m  AORTIC VALVE                    PULMONIC VALVE AV Area (Vmax):    2.71 cm     PV Vmax:       0.90 m/s AV Area (Vmean):   2.99 cm     PV Peak grad:  3.3 mmHg AV Area (VTI):     3.00 cm AV Vmax:           129.00 cm/s AV Vmean:          72.300 cm/s AV VTI:            0.261 m AV Peak Grad:      6.7 mmHg AV Mean Grad:      3.0 mmHg LVOT Vmax:         101.00 cm/s LVOT Vmean:  62.500 cm/s LVOT VTI:          0.226 m LVOT/AV VTI ratio: 0.87  AORTA Ao Root diam: 3.00 cm MITRAL VALVE MV Area (PHT): 2.59 cm    SHUNTS MV Area VTI:   2.20 cm    Systemic VTI:  0.23 m MV Peak grad:  4.7 mmHg    Systemic Diam: 2.10 cm MV Mean grad:  1.0 mmHg MV Vmax:       1.08 m/s MV Vmean:      44.7 cm/s MV Decel Time: 293 msec MV E velocity: 64.70 cm/s MV A velocity: 90.40 cm/s MV E/A ratio:  0.72 Epifanio Lesches MD Electronically signed by Epifanio Lesches MD Signature Date/Time: 01/12/2022/10:56:32 AM    Final    CT HEAD WO CONTRAST ( )  Result Date: 01/12/2022 CLINICAL DATA:  61 year old female with chest pain, shortness of breath, hypertension, headaches. EXAM: CT HEAD WITHOUT CONTRAST TECHNIQUE: Contiguous axial images were obtained from the base of the skull through the vertex without intravenous contrast. RADIATION DOSE REDUCTION: This exam was performed according to the departmental dose-optimization program which includes automated exposure  control, adjustment of the mA and/or kV according to patient size and/or use of iterative reconstruction technique. COMPARISON:  Head CT 11/10/2021 and earlier. FINDINGS: Brain: Stable asymmetric vascular calcifications at the basal ganglia. Normal cerebral volume. No midline shift, ventriculomegaly, mass effect, evidence of mass lesion, intracranial hemorrhage or evidence of cortically based acute infarction. Gray-white matter differentiation is stable since May. There is patchy mild for age bilateral white matter hypodensity. No cortical encephalomalacia identified. Vascular: Mild Calcified atherosclerosis at the skull base. No suspicious intracranial vascular hyperdensity. Skull: Stable, negative. Sinuses/Orbits: Visualized paranasal sinuses and mastoids are stable and well aerated. Other: Visualized orbits and scalp soft tissues are within normal limits. IMPRESSION: No acute intracranial abnormality. Stable and negative for age non contrast CT appearance of the brain. Electronically Signed   By: Odessa Fleming M.D.   On: 01/12/2022 05:44   DG Chest Port 1 View  Result Date: 01/11/2022 CLINICAL DATA:  Chest pain and dyspnea EXAM: PORTABLE CHEST 1 VIEW COMPARISON:  Radiograph 09/04/2020 FINDINGS: Stable borderline enlargement of the cardiac silhouette. No focal consolidation, pleural effusion, or pneumothorax. No acute osseous abnormality. IMPRESSION: No active disease. Electronically Signed   By: Minerva Fester M.D.   On: 01/11/2022 13:25     Discharge Exam: Vitals:   01/12/22 0914 01/12/22 0918  BP: (!) 168/96 (!) 168/96  Pulse: 65   Resp:    Temp:    SpO2:     Vitals:   01/12/22 0726 01/12/22 0827 01/12/22 0914 01/12/22 0918  BP: (!) 179/75  (!) 168/96 (!) 168/96  Pulse: (!) 52  65   Resp:      Temp:      TempSrc:      SpO2: 100% 99%    Weight:      Height:        General: Pt is alert, awake, not in acute distress Cardiovascular: RRR, S1/S2 +, no rubs, no gallops Respiratory: CTA  bilaterally, no wheezing, no rhonchi Abdominal: Soft, NT, ND, bowel sounds + Extremities: no edema, no cyanosis    The results of significant diagnostics from this hospitalization (including imaging, microbiology, ancillary and laboratory) are listed below for reference.     Microbiology: No results found for this or any previous visit (from the past 240 hour(s)).   Labs: BNP (last 3 results) No results for input(s): "BNP" in the last 8760  hours. Basic Metabolic Panel: Recent Labs  Lab 01/11/22 1308  NA 142  K 3.4*  CL 110  CO2 27  GLUCOSE 89  BUN 12  CREATININE 0.80  CALCIUM 8.7*   Liver Function Tests: No results for input(s): "AST", "ALT", "ALKPHOS", "BILITOT", "PROT", "ALBUMIN" in the last 168 hours. No results for input(s): "LIPASE", "AMYLASE" in the last 168 hours. No results for input(s): "AMMONIA" in the last 168 hours. CBC: Recent Labs  Lab 01/11/22 1308  WBC 5.4  HGB 11.4*  HCT 36.2  MCV 70.8*  PLT 196   Cardiac Enzymes: No results for input(s): "CKTOTAL", "CKMB", "CKMBINDEX", "TROPONINI" in the last 168 hours. BNP: Invalid input(s): "POCBNP" CBG: No results for input(s): "GLUCAP" in the last 168 hours. D-Dimer Recent Labs    01/11/22 1520  DDIMER 0.58*   Hgb A1c No results for input(s): "HGBA1C" in the last 72 hours. Lipid Profile Recent Labs    01/12/22 0611  CHOL 198  HDL 84  LDLCALC 105*  TRIG 44  CHOLHDL 2.4   Thyroid function studies No results for input(s): "TSH", "T4TOTAL", "T3FREE", "THYROIDAB" in the last 72 hours.  Invalid input(s): "FREET3" Anemia work up No results for input(s): "VITAMINB12", "FOLATE", "FERRITIN", "TIBC", "IRON", "RETICCTPCT" in the last 72 hours. Urinalysis    Component Value Date/Time   COLORURINE AMBER (A) 02/22/2021 1946   APPEARANCEUR CLOUDY (A) 02/22/2021 1946   LABSPEC >1.030 (H) 02/22/2021 1946   PHURINE 5.0 02/22/2021 1946   GLUCOSEU 100 (A) 02/22/2021 1946   HGBUR NEGATIVE 02/22/2021 1946    BILIRUBINUR MODERATE (A) 02/22/2021 1946   KETONESUR 15 (A) 02/22/2021 1946   PROTEINUR 100 (A) 02/22/2021 1946   UROBILINOGEN 0.2 03/15/2010 2131   NITRITE POSITIVE (A) 02/22/2021 1946   LEUKOCYTESUR TRACE (A) 02/22/2021 1946   Sepsis Labs Recent Labs  Lab 01/11/22 1308  WBC 5.4   Microbiology No results found for this or any previous visit (from the past 240 hour(s)).   Time coordinating discharge: 35 minutes  SIGNED:   Erick Blinks, DO [facility] 01/12/2022, 12:47 PM  If 7PM-7AM, please contact night-coverage [email or web address]

## 2022-01-12 NOTE — Progress Notes (Signed)
  Transition of Care [facility]) Screening Note   Patient Details  Name: Vicki Dean Date of Birth: 10-19-1960   Transition of Care [facility]) CM/SW Contact:    Villa Herb, LCSWA Phone Number: 01/12/2022, 9:17 AM  CSW updated [facility] of pts hospital admission. VA notification ID is [phone].   Transition of Care Department [facility]) has reviewed patient and no TOC needs have been identified at this time. We will continue to monitor patient advancement through interdisciplinary progression rounds. If new patient transition needs arise, please place a TOC consult.

## 2022-01-12 NOTE — Progress Notes (Signed)
Patient stable and ready for discharge home. Patient's IV removed and tele. Patient dressed herself and packed her belongings. Writer went over discharge paperwork with patient and she verbalized understanding and had no questions at this time. Writer transported patient via WC to her daughter's car for discharge home.

## 2022-01-12 NOTE — Progress Notes (Signed)
*  PRELIMINARY RESULTS* Echocardiogram 2D Echocardiogram has been performed.  Carolyne Fiscal 01/12/2022, 10:35 AM

## 2022-01-18 ENCOUNTER — Other Ambulatory Visit: Payer: Self-pay

## 2022-01-18 ENCOUNTER — Emergency Department (HOSPITAL_COMMUNITY): Payer: No Typology Code available for payment source

## 2022-01-18 ENCOUNTER — Encounter (HOSPITAL_COMMUNITY): Payer: Self-pay | Admitting: *Deleted

## 2022-01-18 ENCOUNTER — Emergency Department (HOSPITAL_COMMUNITY)
Admission: EM | Admit: 2022-01-18 | Discharge: 2022-01-18 | Disposition: A | Discharge status: Home / Self Care | Payer: No Typology Code available for payment source | Attending: Emergency Medicine | Admitting: Emergency Medicine

## 2022-01-18 DIAGNOSIS — Z79899 Other long term (current) drug therapy: Secondary | ICD-10-CM | POA: Diagnosis not present

## 2022-01-18 DIAGNOSIS — I10 Essential (primary) hypertension: Secondary | ICD-10-CM | POA: Diagnosis not present

## 2022-01-18 DIAGNOSIS — R079 Chest pain, unspecified: Secondary | ICD-10-CM | POA: Diagnosis present

## 2022-01-18 LAB — BASIC METABOLIC PANEL
Anion gap: 3 — ABNORMAL LOW (ref 5–15)
BUN: 17 mg/dL (ref 8–23)
CO2: 28 mmol/L (ref 22–32)
Calcium: 9 mg/dL (ref 8.9–10.3)
Chloride: 110 mmol/L (ref 98–111)
Creatinine, Ser: 0.71 mg/dL (ref 0.44–1.00)
GFR, Estimated: >60 mL/min (ref >60)
Glucose, Bld: 95 mg/dL (ref 70–99)
Potassium: 3.7 mmol/L (ref 3.5–5.1)
Sodium: 141 mmol/L (ref 135–145)

## 2022-01-18 LAB — CBC
HCT: 37.6 % (ref 36.0–46.0)
Hemoglobin: 12 g/dL (ref 12.0–15.0)
MCH: 22.3 pg — ABNORMAL LOW (ref 26.0–34.0)
MCHC: 31.9 g/dL (ref 30.0–36.0)
MCV: 69.8 fL — ABNORMAL LOW (ref 80.0–100.0)
Platelets: 257 10*3/uL (ref 150–400)
RBC: 5.39 MIL/uL — ABNORMAL HIGH (ref 3.87–5.11)
RDW: 15.9 % — ABNORMAL HIGH (ref 11.5–15.5)
WBC: 7.1 10*3/uL (ref 4.0–10.5)
nRBC: 0 % (ref 0.0–0.2)

## 2022-01-18 LAB — TROPONIN I (HIGH SENSITIVITY)
Troponin I (High Sensitivity): 3 ng/L (ref <18)
Troponin I (High Sensitivity): 3 ng/L (ref <18)

## 2022-01-18 MED ORDER — FAMOTIDINE 20 MG PO TABS
20.0000 mg | ORAL_TABLET | Freq: Once | ORAL | Status: AC
  Administered 2022-01-18: 20 mg via ORAL
  Filled 2022-01-18: qty 1

## 2022-01-18 MED ORDER — KETOROLAC TROMETHAMINE 15 MG/ML IJ SOLN: 15.0000 mg | Freq: Once | INTRAMUSCULAR | Status: DC

## 2022-01-18 MED ORDER — HYDROCHLOROTHIAZIDE 12.5 MG PO TABS
12.5000 mg | ORAL_TABLET | Freq: Every day | ORAL | 0 refills | Status: DC
Start: 2022-01-18 — End: 2022-01-18

## 2022-01-18 MED ORDER — AMLODIPINE BESYLATE 5 MG PO TABS: 5.0000 mg | ORAL_TABLET | Freq: Every day | ORAL | 0 refills | Status: DC

## 2022-01-18 MED ORDER — ACETAMINOPHEN 500 MG PO TABS
1000.0000 mg | ORAL_TABLET | Freq: Once | ORAL | Status: AC
Start: 2022-01-18 — End: 2022-01-18
  Administered 2022-01-18: 1000 mg via ORAL
  Filled 2022-01-18: qty 2

## 2022-01-18 MED ORDER — PANTOPRAZOLE SODIUM 20 MG PO TBEC: 20.0000 mg | DELAYED_RELEASE_TABLET | Freq: Every day | ORAL | 0 refills | Status: DC

## 2022-01-18 MED ORDER — KETOROLAC TROMETHAMINE 15 MG/ML IJ SOLN
15.0000 mg | Freq: Once | INTRAMUSCULAR | Status: AC
  Administered 2022-01-18: 15 mg via INTRAMUSCULAR
  Filled 2022-01-18: qty 1

## 2022-01-18 MED ORDER — ALUM & MAG HYDROXIDE-SIMETH 200-200-20 MG/5ML PO SUSP
30.0000 mL | Freq: Once | ORAL | Status: AC
  Administered 2022-01-18: 30 mL via ORAL
  Filled 2022-01-18: qty 30

## 2022-01-18 MED ORDER — HYDROCHLOROTHIAZIDE 12.5 MG PO TABS
12.5000 mg | ORAL_TABLET | Freq: Every day | ORAL | 0 refills | Status: AC
Start: 2022-01-18 — End: 2023-03-29

## 2022-01-18 MED ORDER — ACETAMINOPHEN 325 MG PO TABS: 650.0000 mg | ORAL_TABLET | Freq: Four times a day (QID) | ORAL | 0 refills | Status: AC | PRN

## 2022-01-18 NOTE — ED Notes (Signed)
Discharge instructions reviewed with the patient. The patient showed an understanding of the instructions given. Patient was discharged.

## 2022-01-18 NOTE — Discharge Instructions (Signed)
You were seen today for ongoing chest pain.  Your symptoms are likely related to musculoskeletal or gastroesophageal symptoms.  It is important that you continue following up with your primary care provider given your cardiac history and risk.  Recommend starting pantoprazole and Tylenol as well as initiating the blood pressure medicines that were prescribed for you last week.  Please follow-up closely with your specialist at the [facility].  Thank for the opportunity to participate in your care.  [email or web address]  Phone: [phone] Fax: [phone]

## 2022-01-18 NOTE — ED Provider Triage Note (Signed)
Emergency Medicine Provider Triage Evaluation Note  Vicki Dean , a 61 y.o. female  was evaluated in triage.  Pt complains of ongoing chest pain.  The pain is mild but persistent.  She was seen, and admitted, last week for the same and was told to follow-up with a doctor by now but since she had not been able to do that she came here instead.  On prior visit she was apparently using cocaine.  She stayed overnight and was discharged the next day.  During that visit she was evaluated by cardiology had a 2D echocardiogram and was started on amlodipine and hydrochlorothiazide for hypertension..  Review of Systems  Positive: Chest pain Negative: No shortness of breath or weakness  Physical Exam  BP (!) 163/87 (BP Location: Right Arm)   Pulse 67   Temp 98.5 F (36.9 C) (Oral)   Resp 19   Ht 5\' 7"  (1.702 m)   Wt 86 kg   SpO2 98%   BMI 29.69 kg/m  Gen:   Awake, no distress   Resp:  Normal effort  MSK:   Moves extremities without difficulty  Other:  Nontoxic-appearing  Medical Decision Making  Medically screening exam initiated at 3:06 PM.  Appropriate orders placed.  Vicki Dean was informed that the remainder of the evaluation will be completed by another provider, this initial triage assessment does not replace that evaluation, and the importance of remaining in the ED until their evaluation is complete.  Patient presenting for noncardiac chest pain, for reassurance following recent hospitalization and initiation of medication for blood pressure.  We will check screening labs and reevaluate.  Initial blood pressure is mildly elevated.   Dartha Lodge, MD 01/18/22 1520

## 2022-01-18 NOTE — ED Triage Notes (Signed)
Pt c/o left side chest pain that radiates down left arm with some sob

## 2022-01-18 NOTE — ED Provider Notes (Signed)
[facility] EMERGENCY DEPARTMENT Provider Note   CSN: [id number] Arrival date & time: 01/18/22  1448     History Chief Complaint  Patient presents with   Chest Pain    HPI Vicki Dean is a 61 y.o. female presenting for recurrence of her chest pain.  She endorses years of chronic chest pain with intermittent exacerbations.  She was seen last week for similar, underwent a extended chest pain observation protocol including a stress echocardiogram and multiple troponins all of which were grossly reassuring.  She was deemed stable for outpatient care and management.  [facility] was [facility] to [facility]. She is currently ambulatory tolerating p.o. intake.  She states that after lunch today she started having feelings of heartburn and that this developed into worsening chest pain.  It started approximately 14 hours prior to arrival and has been improving.  She is currently asymptomatic outside of frequent belching. Patient states that her main concern is getting her records so that she is able to schedule the appropriate follow-up with her PCP at the [place]. Patient states she has been noncompliant on all medications including her recently prescribed hypertensive medications.  Patient's recorded medical, surgical, social, medication list and allergies were reviewed in the Snapshot window as part of the initial history.   Review of Systems   Review of Systems  Constitutional:  Negative for chills and fever.  HENT:  Negative for ear pain and sore throat.   Eyes:  Negative for pain and visual disturbance.  Respiratory:  Negative for cough and shortness of breath.   Cardiovascular:  Positive for chest pain. Negative for palpitations.  Gastrointestinal:  Negative for abdominal pain and vomiting.  Genitourinary:  Negative for dysuria and hematuria.  Musculoskeletal:  Negative for arthralgias and back pain.  Skin:  Negative for  color change and rash.  Neurological:  Negative for seizures and syncope.  All other systems reviewed and are negative.   Physical Exam Updated Vital Signs BP (!) 143/79   Pulse 69   Temp 98.5 F (36.9 C) (Oral)   Resp 18   Ht 5\' 7"  (1.702 m)   Wt 86 kg   SpO2 100%   BMI 29.69 kg/m  Physical Exam Vitals and nursing note reviewed.  Constitutional:      General: She is not in acute distress.    Appearance: She is well-developed.  HENT:     Head: Normocephalic and atraumatic.  Eyes:     Conjunctiva/sclera: Conjunctivae normal.  Cardiovascular:     Rate and Rhythm: Normal rate and regular rhythm.     Heart sounds: No murmur heard. Pulmonary:     Effort: Pulmonary effort is normal. No respiratory distress.     Breath sounds: Normal breath sounds.  Abdominal:     General: There is no distension.     Palpations: Abdomen is soft.     Tenderness: There is no abdominal tenderness. There is no right CVA tenderness or left CVA tenderness.  Musculoskeletal:        General: No swelling or tenderness. Normal range of motion.     Cervical back: Neck supple.  Skin:    General: Skin is warm and dry.  Neurological:     General: No focal deficit present.     Mental Status: She is alert and oriented to person, place, and time. Mental status is at baseline.     Cranial Nerves: No cranial nerve  deficit.      ED Course/ Medical Decision Making/ A&P Clinical Course as of 01/18/22 2015  Wed Jan 18, 2022  1943 CBC(!) [CC]    Clinical Course User Index [CC] Tretha Sciara, MD    Procedures Procedures   Medications Ordered in ED Medications  acetaminophen (TYLENOL) tablet 1,000 mg (has no administration in time range)  famotidine (PEPCID) tablet 20 mg (has no administration in time range)  alum & mag hydroxide-simeth (MAALOX/MYLANTA) 200-200-20 MG/5ML suspension 30 mL (has no administration in time range)  ketorolac (TORADOL) 15 MG/ML injection 15 mg (has no administration in  time range)   Medical Decision Making:  Vicki Dean is a 61 y.o. female who presented to the ED today with chest pain, detailed above.  Based on patient's comorbidities, patient has a heart score of 2.     Additional history discussed with patient's family/caregivers.  Patient's presentation is complicated by their history of multiple comorbid medical conditions.  Patient placed on continuous vitals and telemetry monitoring while in ED which was reviewed periodically.   Complete initial physical exam performed, notably the patient was HDS in NAD.    Reviewed and confirmed nursing documentation for past medical history, family history, social history.       Initial Assessment:  With the patient's presentation of left-sided chest pain, most likely diagnosis is musculoskeletal chest pain versus GERD, although ACS remains on the differential. Other diagnoses were considered including (but not limited to) pulmonary embolism, community-acquired pneumonia, aortic dissection, pneumothorax, underlying bony abnormality, anemia. These are considered less likely due to history of present illness and physical exam findings.     In particular, concerning pulmonary embolism: Patient is without malignancy, recent surgery, history of DVT, or calf tenderness leading to a low risk Wells score.  Aortic Dissection also reconsidered but seems less likely based on the location, quality, onset, and severity of symptoms in this case. Patient also has a lack of underlying history of AD or TAA. Additionally, physical exam is without a difference of more than 12mmHg in blood pressure between the arms.   This is most consistent with an acute life/limb threatening illness complicated by underlying chronic conditions.   Initial Plan:  Evaluate for ACS with delta troponin and EKG evaluated as below  Evaluate for dissection, bony abnormality, or pneumonia with chest x-ray and screening laboratory evaluation including  CBC, BMP  Further evaluation for pulmonary embolism not indicated at this time based on patient's PERC and Wells score.  Further evaluation for Thoracic Aortic Dissection not indicated at this time based on patient's clinical history and PE findings.          Initial Study Results: EKG was reviewed independently. Rate, rhythm, axis, intervals all examined and without medically relevant abnormality. ST segments without concerns for elevations.     Laboratory   Delta  troponin demonstratedno acute abnormalities   CBC and BMP without obvious metabolic or inflammatory abnormalities requiring further evaluation      Radiology   DG Chest 2 View  Final Result         Final Assessment and Plan:   On repeat evaluation, patient's symptoms are grossly improved.  She is overall well-appearing, hemodynamically stable.  Hypertension likely related to ongoing medication noncompliance.  Reviewed all of her port and outpatient medications and symptoms and will reinforce antacid use, antihypertensive use, as well as anti-inflammation use.  Patient arranged for getting her records so that she can follow-up with her PCP in the  outpatient setting.  Given recent prolonged observation period for similar symptoms without diagnostic cardiac abnormality, patient stable for continued outpatient care and management after delta troponin today reassuring for cardiac etiology.  Patient discharged with no further acute events, ambulatory tolerating p.o. intake in no acute distress.           Clinical Impression:  1. Chest pain, unspecified type         Data Unavailable   Final Clinical Impression(s) / ED Diagnoses Final diagnoses:  Chest pain, unspecified type    Rx / DC Orders ED Discharge Orders          Ordered    amLODipine (NORVASC) 5 MG tablet  Daily        01/18/22 2013    hydrochlorothiazide (HYDRODIURIL) 12.5 MG tablet  Daily        01/18/22 2013              Glyn Ade, MD 01/19/22 0021

## 2022-02-09 ENCOUNTER — Encounter: Payer: Self-pay | Admitting: *Deleted

## 2022-02-27 ENCOUNTER — Ambulatory Visit: Payer: No Typology Code available for payment source | Admitting: Gastroenterology

## 2022-02-27 NOTE — Progress Notes (Deleted)
GI Office Note    Referring Provider: [facility] Primary Care Physician:  [facility]  Primary Gastroenterologist:  Chief Complaint   No chief complaint on file.    History of Present Illness   Vicki Dean is a 61 y.o. female presenting today at the request of the [place] for ***  She was seen last in 04/2019. She has history of IDA, heme + stool, constipation, GERD.    She had a EGD and colonoscopy in March 2019 showed mild erosive reflux esophagitis status post dilation, grade 2 hemorrhoids, next colonoscopy planned for 10 years.     Medications   Current Outpatient Medications  Medication Sig Dispense Refill   albuterol (PROVENTIL HFA;VENTOLIN HFA) 108 (90 Base) MCG/ACT inhaler Inhale 1-2 puffs into the lungs every 6 (six) hours as needed for wheezing or shortness of breath. (Patient not taking: Reported on 01/11/2022)     amLODipine (NORVASC) 5 MG tablet Take 1 tablet (5 mg total) by mouth daily. 30 tablet 0   budesonide-formoterol (SYMBICORT) 160-4.5 MCG/ACT inhaler Inhale 2 puffs into the lungs 2 (two) times daily. (Patient not taking: Reported on 01/11/2022)     diclofenac (VOLTAREN) 75 MG EC tablet Take 1 tablet (75 mg total) by mouth 2 (two) times daily. (Patient not taking: Reported on 01/11/2022) 20 tablet 0   doxycycline (VIBRAMYCIN) 100 MG capsule Take 100 mg by mouth 2 (two) times daily. (Patient not taking: Reported on 01/11/2022)     hydrochlorothiazide (HYDRODIURIL) 12.5 MG tablet Take 1 tablet (12.5 mg total) by mouth daily. 30 tablet 0   meloxicam (MOBIC) 15 MG tablet Take 1 tablet (15 mg total) by mouth daily. 30 tablet 2   pantoprazole (PROTONIX) 20 MG tablet Take 1 tablet (20 mg total) by mouth daily for 14 days. 14 tablet 0   No current facility-administered medications for this visit.    Allergies   Allergies as of 02/27/2022 - Review Complete 01/18/2022  Allergen Reaction Noted   Doxepin  07/31/2017   Lisinopril   06/28/2017   Omeprazole  06/28/2017   Pneumococcal vaccines  07/31/2017    Past Medical History   Past Medical History:  Diagnosis Date   Anxiety    Arthritis    possibly her right shoulder.   Chronic back pain    COPD (chronic obstructive pulmonary disease) (HCC)    Fibromyalgia    GERD (gastroesophageal reflux disease)    Hypertension    Sleep apnea    could not tolerate    Past Surgical History   Past Surgical History:  Procedure Laterality Date   ABDOMINAL HYSTERECTOMY     CARPAL TUNNEL RELEASE Left    CHOLECYSTECTOMY N/A 02/25/2021   Procedure: LAPAROSCOPIC CHOLECYSTECTOMY;  Surgeon: Franky Macho, MD;  Location: [facility];  Service: General;  Laterality: N/A;   COLONOSCOPY WITH PROPOFOL N/A 08/23/2017   Dr. Jena Gauss, grade II hemorrhoids. next TCS in 10 years.    ESOPHAGOGASTRODUODENOSCOPY (EGD) WITH PROPOFOL N/A 08/23/2017   Dr. Jena Gauss: mild erosive reflux esophagitis s/p dilation due to h/o dysphagia.    FOOT SURGERY Right    screws from fracture   GANGLION CYST EXCISION Right    foot   KNEE SURGERY Right    arthroscopy   MALONEY DILATION N/A 08/23/2017   Procedure: Elease Hashimoto DILATION;  Surgeon: Corbin Ade, MD;  Location: [facility];  Service: Endoscopy;  Laterality: N/A;    Past Family History   Family History  Problem Relation Age of Onset   Hypertension Mother    Diabetes Mother    Arthritis Mother    Hypercholesterolemia Mother    Emphysema Father    Cancer Other    Diabetes Other    Colon cancer Neg Hx    Gastric cancer Neg Hx    Esophageal cancer Neg Hx     Past Social History   Social History   Socioeconomic History   Marital status: Single    Spouse name: Not on file   Number of children: Not on file   Years of education: Not on file   Highest education level: Not on file  Occupational History   Not on file  Tobacco Use   Smoking status: Former    Packs/day: 1.00    Years: 20.00    Total pack years: 20.00    Types: Cigarettes    Quit  date: 08/20/2013    Years since quitting: 8.5   Smokeless tobacco: Never  Vaping Use   Vaping Use: Never used  Substance and Sexual Activity   Alcohol use: Not Currently    Comment: in the past   Drug use: Not Currently    Types: Marijuana    Comment: denied 05/05/19   Sexual activity: Yes    Birth control/protection: Surgical  Other Topics Concern   Not on file  Social History Narrative   Not on file   Social Determinants of Health   Financial Resource Strain: Not on file  Food Insecurity: Not on file  Transportation Needs: Not on file  Physical Activity: Not on file  Stress: Not on file  Social Connections: Not on file  Intimate Partner Violence: Not on file    Review of Systems   General: Negative for anorexia, weight loss, fever, chills, fatigue, weakness. Eyes: Negative for vision changes.  ENT: Negative for hoarseness, difficulty swallowing , nasal congestion. CV: Negative for chest pain, angina, palpitations, dyspnea on exertion, peripheral edema.  Respiratory: Negative for dyspnea at rest, dyspnea on exertion, cough, sputum, wheezing.  GI: See history of present illness. GU:  Negative for dysuria, hematuria, urinary incontinence, urinary frequency, nocturnal urination.  MS: Negative for joint pain, low back pain.  Derm: Negative for rash or itching.  Neuro: Negative for weakness, abnormal sensation, seizure, frequent headaches, memory loss,  confusion.  Psych: Negative for anxiety, depression, suicidal ideation, hallucinations.  Endo: Negative for unusual weight change.  Heme: Negative for bruising or bleeding. Allergy: Negative for rash or hives.  Physical Exam   There were no vitals taken for this visit.   General: Well-nourished, well-developed in no acute distress.  Head: Normocephalic, atraumatic.   Eyes: Conjunctiva pink, no icterus. Mouth: Oropharyngeal mucosa moist and pink , no lesions erythema or exudate. Neck: Supple without thyromegaly, masses,  or lymphadenopathy.  Lungs: Clear to auscultation bilaterally.  Heart: Regular rate and rhythm, no murmurs rubs or gallops.  Abdomen: Bowel sounds are normal, nontender, nondistended, no hepatosplenomegaly or masses,  no abdominal bruits or hernia, no rebound or guarding.   Rectal: *** Extremities: No lower extremity edema. No clubbing or deformities.  Neuro: Alert and oriented x 4 , grossly normal neurologically.  Skin: Warm and dry, no rash or jaundice.   Psych: Alert and cooperative, normal mood and affect.  Labs   *** Imaging Studies   No results found.  Assessment       PLAN   ***   Leanna Battles. Melvyn Neth, MHS, PA-C [facility]

## 2022-03-20 NOTE — Progress Notes (Deleted)
GI Office Note    Referring Provider: [facility] Primary Care Physician:  [facility]  Primary Gastroenterologist: Garfield Cornea, MD   Chief Complaint   No chief complaint on file.    History of Present Illness   Vicki Dean is a 61 y.o. female presenting today     Last seen in 04/2019. H/o IDA, heme pos stool, constipation, GERD. She had a EGD and colonoscopy in March 2019 showed mild erosive reflux esophagitis status post dilation, grade 2 hemorrhoids, next colonoscopy planned for 10 years.  UDS positive for cocaine and THC one year ago.    Medications   Current Outpatient Medications  Medication Sig Dispense Refill   albuterol (PROVENTIL HFA;VENTOLIN HFA) 108 (90 Base) MCG/ACT inhaler Inhale 1-2 puffs into the lungs every 6 (six) hours as needed for wheezing or shortness of breath. (Patient not taking: Reported on 01/11/2022)     amLODipine (NORVASC) 5 MG tablet Take 1 tablet (5 mg total) by mouth daily. 30 tablet 0   budesonide-formoterol (SYMBICORT) 160-4.5 MCG/ACT inhaler Inhale 2 puffs into the lungs 2 (two) times daily. (Patient not taking: Reported on 01/11/2022)     diclofenac (VOLTAREN) 75 MG EC tablet Take 1 tablet (75 mg total) by mouth 2 (two) times daily. (Patient not taking: Reported on 01/11/2022) 20 tablet 0   doxycycline (VIBRAMYCIN) 100 MG capsule Take 100 mg by mouth 2 (two) times daily. (Patient not taking: Reported on 01/11/2022)     hydrochlorothiazide (HYDRODIURIL) 12.5 MG tablet Take 1 tablet (12.5 mg total) by mouth daily. 30 tablet 0   meloxicam (MOBIC) 15 MG tablet Take 1 tablet (15 mg total) by mouth daily. 30 tablet 2   pantoprazole (PROTONIX) 20 MG tablet Take 1 tablet (20 mg total) by mouth daily for 14 days. 14 tablet 0   No current facility-administered medications for this visit.    Allergies   Allergies as of 03/21/2022 - Review Complete 01/18/2022  Allergen Reaction Noted   Doxepin  07/31/2017    Lisinopril  06/28/2017   Omeprazole  06/28/2017   Pneumococcal vaccines  07/31/2017    Past Medical History   Past Medical History:  Diagnosis Date   Anxiety    Arthritis    possibly her right shoulder.   Chronic back pain    COPD (chronic obstructive pulmonary disease) (HCC)    Fibromyalgia    GERD (gastroesophageal reflux disease)    Hypertension    Sleep apnea    could not tolerate    Past Surgical History   Past Surgical History:  Procedure Laterality Date   ABDOMINAL HYSTERECTOMY     CARPAL TUNNEL RELEASE Left    CHOLECYSTECTOMY N/A 02/25/2021   Procedure: LAPAROSCOPIC CHOLECYSTECTOMY;  Surgeon: Aviva Signs, MD;  Location: [facility];  Service: General;  Laterality: N/A;   COLONOSCOPY WITH PROPOFOL N/A 08/23/2017   Dr. Gala Romney, grade II hemorrhoids. next TCS in 10 years.    ESOPHAGOGASTRODUODENOSCOPY (EGD) WITH PROPOFOL N/A 08/23/2017   Dr. Gala Romney: mild erosive reflux esophagitis s/p dilation due to h/o dysphagia.    FOOT SURGERY Right    screws from fracture   GANGLION CYST EXCISION Right    foot   KNEE SURGERY Right    arthroscopy   MALONEY DILATION N/A 08/23/2017   Procedure: Venia Minks DILATION;  Surgeon: Daneil Dolin, MD;  Location[facility];  Service: Endoscopy;  Laterality: N/A;    Past Family History   Family History  Problem Relation Age of Onset   Hypertension Mother    Diabetes Mother    Arthritis Mother    Hypercholesterolemia Mother    Emphysema Father    Cancer Other    Diabetes Other    Colon cancer Neg Hx    Gastric cancer Neg Hx    Esophageal cancer Neg Hx     Past Social History   Social History   Socioeconomic History   Marital status: Single    Spouse name: Not on file   Number of children: Not on file   Years of education: Not on file   Highest education level: Not on file  Occupational History   Not on file  Tobacco Use   Smoking status: Former    Packs/day: 1.00    Years: 20.00    Total pack years: 20.00    Types:  Cigarettes    Quit date: 08/20/2013    Years since quitting: 8.5   Smokeless tobacco: Never  Vaping Use   Vaping Use: Never used  Substance and Sexual Activity   Alcohol use: Not Currently    Comment: in the past   Drug use: Not Currently    Types: Marijuana    Comment: denied 05/05/19   Sexual activity: Yes    Birth control/protection: Surgical  Other Topics Concern   Not on file  Social History Narrative   Not on file   Social Determinants of Health   Financial Resource Strain: Not on file  Food Insecurity: Not on file  Transportation Needs: Not on file  Physical Activity: Not on file  Stress: Not on file  Social Connections: Not on file  Intimate Partner Violence: Not on file    Review of Systems   General: Negative for anorexia, weight loss, fever, chills, fatigue, weakness. Eyes: Negative for vision changes.  ENT: Negative for hoarseness, difficulty swallowing , nasal congestion. CV: Negative for chest pain, angina, palpitations, dyspnea on exertion, peripheral edema.  Respiratory: Negative for dyspnea at rest, dyspnea on exertion, cough, sputum, wheezing.  GI: See history of present illness. GU:  Negative for dysuria, hematuria, urinary incontinence, urinary frequency, nocturnal urination.  MS: Negative for joint pain, low back pain.  Derm: Negative for rash or itching.  Neuro: Negative for weakness, abnormal sensation, seizure, frequent headaches, memory loss,  confusion.  Psych: Negative for anxiety, depression, suicidal ideation, hallucinations.  Endo: Negative for unusual weight change.  Heme: Negative for bruising or bleeding. Allergy: Negative for rash or hives.  Physical Exam   There were no vitals taken for this visit.   General: Well-nourished, well-developed in no acute distress.  Head: Normocephalic, atraumatic.   Eyes: Conjunctiva pink, no icterus. Mouth: Oropharyngeal mucosa moist and pink , no lesions erythema or exudate. Neck: Supple without  thyromegaly, masses, or lymphadenopathy.  Lungs: Clear to auscultation bilaterally.  Heart: Regular rate and rhythm, no murmurs rubs or gallops.  Abdomen: Bowel sounds are normal, nontender, nondistended, no hepatosplenomegaly or masses,  no abdominal bruits or hernia, no rebound or guarding.   Rectal: *** Extremities: No lower extremity edema. No clubbing or deformities.  Neuro: Alert and oriented x 4 , grossly normal neurologically.  Skin: Warm and dry, no rash or jaundice.   Psych: Alert and cooperative, normal mood and affect.  Labs   Lab Results  Component Value Date   CREATININE 0.71 01/18/2022   BUN 17 01/18/2022   NA 141 01/18/2022   K 3.7 01/18/2022   CL 110 01/18/2022  CO2 28 01/18/2022   Lab Results  Component Value Date   WBC 7.1 01/18/2022   HGB 12.0 01/18/2022   HCT 37.6 01/18/2022   MCV 69.8 (L) 01/18/2022   PLT 257 01/18/2022   Lab Results  Component Value Date   ALT 16 02/26/2021   AST 20 02/26/2021   ALKPHOS 99 02/26/2021   BILITOT 0.4 02/26/2021   Lab Results  Component Value Date   IRON 10 (L) 02/25/2021   TIBC 218 (L) 02/25/2021   FERRITIN 254 02/25/2021    Imaging Studies   No results found.  Assessment       PLAN   ***   Laureen Ochs. Bobby Rumpf, [place], [facility]

## 2022-03-21 ENCOUNTER — Ambulatory Visit: Payer: No Typology Code available for payment source | Admitting: Gastroenterology

## 2022-04-12 ENCOUNTER — Ambulatory Visit: Payer: No Typology Code available for payment source | Admitting: Gastroenterology

## 2022-04-23 NOTE — Progress Notes (Deleted)
GI Office Note    Referring Provider: [facility], [facility] Primary Care Physician:  [facility], [facility]  Primary Gastroenterologist:  Chief Complaint   No chief complaint on file.    History of Present Illness   Vicki Dean is a 61 y.o. female presenting today      Seen in 04/2019 for IDA, heme positive stool, constipation, GERD. Work up delayed due to noncompliance.   She had a EGD and colonoscopy in March 2019 showed mild erosive reflux esophagitis status post dilation, grade 2 hemorrhoids, next colonoscopy planned for 10 years.      Medications   Current Outpatient Medications  Medication Sig Dispense Refill   albuterol (PROVENTIL HFA;VENTOLIN HFA) 108 (90 Base) MCG/ACT inhaler Inhale 1-2 puffs into the lungs every 6 (six) hours as needed for wheezing or shortness of breath. (Patient not taking: Reported on 01/11/2022)     amLODipine (NORVASC) 5 MG tablet Take 1 tablet (5 mg total) by mouth daily. 30 tablet 0   budesonide-formoterol (SYMBICORT) 160-4.5 MCG/ACT inhaler Inhale 2 puffs into the lungs 2 (two) times daily. (Patient not taking: Reported on 01/11/2022)     diclofenac (VOLTAREN) 75 MG EC tablet Take 1 tablet (75 mg total) by mouth 2 (two) times daily. (Patient not taking: Reported on 01/11/2022) 20 tablet 0   doxycycline (VIBRAMYCIN) 100 MG capsule Take 100 mg by mouth 2 (two) times daily. (Patient not taking: Reported on 01/11/2022)     hydrochlorothiazide (HYDRODIURIL) 12.5 MG tablet Take 1 tablet (12.5 mg total) by mouth daily. 30 tablet 0   meloxicam (MOBIC) 15 MG tablet Take 1 tablet (15 mg total) by mouth daily. 30 tablet 2   pantoprazole (PROTONIX) 20 MG tablet Take 1 tablet (20 mg total) by mouth daily for 14 days. 14 tablet 0   No current facility-administered medications for this visit.    Allergies   Allergies as of 04/24/2022 - Review Complete 01/18/2022  Allergen Reaction Noted   Doxepin  07/31/2017   Lisinopril  06/28/2017    Omeprazole  06/28/2017   Pneumococcal vaccines  07/31/2017    Past Medical History   Past Medical History:  Diagnosis Date   Anxiety    Arthritis    possibly her right shoulder.   Chronic back pain    COPD (chronic obstructive pulmonary disease) (HCC)    Fibromyalgia    GERD (gastroesophageal reflux disease)    Hypertension    Sleep apnea    could not tolerate    Past Surgical History   Past Surgical History:  Procedure Laterality Date   ABDOMINAL HYSTERECTOMY     CARPAL TUNNEL RELEASE Left    CHOLECYSTECTOMY N/A 02/25/2021   Procedure: LAPAROSCOPIC CHOLECYSTECTOMY;  Surgeon: Aviva Signs, MD;  Location: [facility];  Service: General;  Laterality: N/A;   COLONOSCOPY WITH PROPOFOL N/A 08/23/2017   Dr. Gala Romney, grade II hemorrhoids. next TCS in 10 years.    ESOPHAGOGASTRODUODENOSCOPY (EGD) WITH PROPOFOL N/A 08/23/2017   Dr. Gala Romney: mild erosive reflux esophagitis s/p dilation due to h/o dysphagia.    FOOT SURGERY Right    screws from fracture   GANGLION CYST EXCISION Right    foot   KNEE SURGERY Right    arthroscopy   MALONEY DILATION N/A 08/23/2017   Procedure: Venia Minks DILATION;  Surgeon: Daneil Dolin, MD;  Location: [facility];  Service: Endoscopy;  Laterality: N/A;    Past Family History   Family History  Problem Relation Age of  Onset   Hypertension Mother    Diabetes Mother    Arthritis Mother    Hypercholesterolemia Mother    Emphysema Father    Cancer Other    Diabetes Other    Colon cancer Neg Hx    Gastric cancer Neg Hx    Esophageal cancer Neg Hx     Past Social History   Social History   Socioeconomic History   Marital status: Single    Spouse name: Not on file   Number of children: Not on file   Years of education: Not on file   Highest education level: Not on file  Occupational History   Not on file  Tobacco Use   Smoking status: Former    Packs/day: 1.00    Years: 20.00    Total pack years: 20.00    Types: Cigarettes    Quit date:  08/20/2013    Years since quitting: 8.6   Smokeless tobacco: Never  Vaping Use   Vaping Use: Never used  Substance and Sexual Activity   Alcohol use: Not Currently    Comment: in the past   Drug use: Not Currently    Types: Marijuana    Comment: denied 05/05/19   Sexual activity: Yes    Birth control/protection: Surgical  Other Topics Concern   Not on file  Social History Narrative   Not on file   Social Determinants of Health   Financial Resource Strain: Not on file  Food Insecurity: Not on file  Transportation Needs: Not on file  Physical Activity: Not on file  Stress: Not on file  Social Connections: Not on file  Intimate Partner Violence: Not on file    Review of Systems   General: Negative for anorexia, weight loss, fever, chills, fatigue, weakness. Eyes: Negative for vision changes.  ENT: Negative for hoarseness, difficulty swallowing , nasal congestion. CV: Negative for chest pain, angina, palpitations, dyspnea on exertion, peripheral edema.  Respiratory: Negative for dyspnea at rest, dyspnea on exertion, cough, sputum, wheezing.  GI: See history of present illness. GU:  Negative for dysuria, hematuria, urinary incontinence, urinary frequency, nocturnal urination.  MS: Negative for joint pain, low back pain.  Derm: Negative for rash or itching.  Neuro: Negative for weakness, abnormal sensation, seizure, frequent headaches, memory loss,  confusion.  Psych: Negative for anxiety, depression, suicidal ideation, hallucinations.  Endo: Negative for unusual weight change.  Heme: Negative for bruising or bleeding. Allergy: Negative for rash or hives.  Physical Exam   There were no vitals taken for this visit.   General: Well-nourished, well-developed in no acute distress.  Head: Normocephalic, atraumatic.   Eyes: Conjunctiva pink, no icterus. Mouth: Oropharyngeal mucosa moist and pink , no lesions erythema or exudate. Neck: Supple without thyromegaly, masses, or  lymphadenopathy.  Lungs: Clear to auscultation bilaterally.  Heart: Regular rate and rhythm, no murmurs rubs or gallops.  Abdomen: Bowel sounds are normal, nontender, nondistended, no hepatosplenomegaly or masses,  no abdominal bruits or hernia, no rebound or guarding.   Rectal: *** Extremities: No lower extremity edema. No clubbing or deformities.  Neuro: Alert and oriented x 4 , grossly normal neurologically.  Skin: Warm and dry, no rash or jaundice.   Psych: Alert and cooperative, normal mood and affect.  Labs   *** Imaging Studies   No results found.  Assessment       PLAN   ***   Laureen Ochs. Bobby Rumpf, Laura, [facility]

## 2022-04-24 ENCOUNTER — Ambulatory Visit: Payer: No Typology Code available for payment source | Admitting: Gastroenterology

## 2022-05-08 ENCOUNTER — Ambulatory Visit
Admission: EM | Admit: 2022-05-08 | Discharge: 2022-05-08 | Disposition: A | Discharge status: Home / Self Care | Payer: No Typology Code available for payment source

## 2022-05-08 DIAGNOSIS — M5432 Sciatica, left side: Secondary | ICD-10-CM

## 2022-05-08 DIAGNOSIS — M5431 Sciatica, right side: Secondary | ICD-10-CM

## 2022-05-08 MED ORDER — KETOROLAC TROMETHAMINE 60 MG/2ML IM SOLN
60.0000 mg | Freq: Once | INTRAMUSCULAR | Status: AC
  Administered 2022-05-08: 60 mg via INTRAMUSCULAR

## 2022-05-08 NOTE — ED Triage Notes (Signed)
Patient presents to UC for back pain that radiates lower legs. Worse over the last two days. Hx of sciatica. Not treating pain with any meds.

## 2022-05-08 NOTE — Discharge Instructions (Signed)
Follow up with your Physician at the [facility] for pain as scheduled

## 2022-05-08 NOTE — ED Provider Notes (Addendum)
[facility]    CSN: [id number] Arrival date & time: 05/08/22  1337      History   Chief Complaint Chief Complaint  Patient presents with   Back Pain   Leg Pain    HPI Vicki Dean is a 61 y.o. female.   Pt complains of an exacerbation of pain.  Pt's daughter reports pt has a history of sciatica and that pt is having a flare up.  Pt has a history of the same.  [facility].  Daughter reports pt usually gets a shot and she gets better.    No language interpreter was used.  Back Pain Location:  Lumbar spine Quality:  Aching Timing:  Constant Progression:  Worsening Chronicity:  New Associated symptoms: leg pain   Leg Pain Relieved by:  Nothing Worsened by:  Nothing Associated symptoms: back pain   Risk factors: no frequent fractures     Past Medical History:  Diagnosis Date   Anxiety    Arthritis    possibly her right shoulder.   Chronic back pain    COPD (chronic obstructive pulmonary disease) (HCC)    Fibromyalgia    GERD (gastroesophageal reflux disease)    Hypertension    Sleep apnea    could not tolerate    Patient Active Problem List   Diagnosis Date Noted   Chest pain 01/11/2022   COPD (chronic obstructive pulmonary disease) (HCC) 01/11/2022   Cocaine abuse (HCC) 01/11/2022   Hypertensive urgency 01/11/2022   Gangrenous cholecystitis    Acute cholecystitis 02/23/2021   AKI (acute kidney injury) (HCC) 02/23/2021   Hypokalemia 02/23/2021   Lower abdominal pain 05/05/2019   Microcytic anemia 10/25/2017   Constipation 10/25/2017   Hematochezia 07/31/2017   Dysphagia 07/31/2017   ANEMIA-NOS 07/06/2006   DEPRESSION 07/06/2006   COMMON MIGRAINE 07/06/2006   Essential hypertension 07/06/2006   GERD (gastroesophageal reflux disease) 07/06/2006   IRRITABLE BOWEL SYNDROME 07/06/2006   INFECTION, URINARY TRACT NOS 07/06/2006   DYSFUNCTIONAL UTERINE BLEEDING 07/06/2006   LOW BACK PAIN 07/06/2006    Past Surgical History:  Procedure  Laterality Date   ABDOMINAL HYSTERECTOMY     CARPAL TUNNEL RELEASE Left    CHOLECYSTECTOMY N/A 02/25/2021   Procedure: LAPAROSCOPIC CHOLECYSTECTOMY;  Surgeon: Franky Macho, MD;  Location: [facility];  Service: General;  Laterality: N/A;   COLONOSCOPY WITH PROPOFOL N/A 08/23/2017   Dr. Jena Gauss, grade II hemorrhoids. next TCS in 10 years.    ESOPHAGOGASTRODUODENOSCOPY (EGD) WITH PROPOFOL N/A 08/23/2017   Dr. Jena Gauss: mild erosive reflux esophagitis s/p dilation due to h/o dysphagia.    FOOT SURGERY Right    screws from fracture   GANGLION CYST EXCISION Right    foot   KNEE SURGERY Right    arthroscopy   MALONEY DILATION N/A 08/23/2017   Procedure: Elease Hashimoto DILATION;  Surgeon: Corbin Ade, MD;  Location[facility];  Service: Endoscopy;  Laterality: N/A;    OB History     Gravida  3   Para  2   Term  2   Preterm      AB  1   Living         SAB  1   IAB      Ectopic      Multiple      Live Births               Home Medications    Prior to Admission medications   Medication Sig  Start Date End Date Taking? Authorizing Provider  esomeprazole (NEXIUM) 20 MG capsule TAKE ONE CAPSULE BY MOUTH EVERY DAY FOR GASTROESOPHAGEAL REFLUX DISEASE 02/02/22  Yes [provider]  lurasidone (LATUDA) 20 MG TABS tablet TAKE ONE TABLET BY MOUTH EVERY EVENING (TAKE WITH EVENING MEAL) 04/21/22  Yes [provider]  traZODone (DESYREL) 100 MG tablet TAKE 1 TO 2 TABLET(S) BY MOUTH EVERY NIGHT AT BEDTIME 04/26/22  Yes [provider]  venlafaxine XR (EFFEXOR-XR) 150 MG 24 hr capsule TAKE ONE CAPSULE BY MOUTH EVERY MORNING FOR MOOD 04/21/22  Yes [provider]  albuterol (PROVENTIL HFA;VENTOLIN HFA) 108 (90 Base) MCG/ACT inhaler Inhale 1-2 puffs into the lungs every 6 (six) hours as needed for wheezing or shortness of breath. Patient not taking: Reported on 01/11/2022    [provider]  amLODipine (NORVASC) 5 MG tablet Take 1 tablet (5 mg total) by mouth  daily. 01/18/22 02/17/22  Glyn Ade, MD  budesonide-formoterol (SYMBICORT) 160-4.5 MCG/ACT inhaler Inhale 2 puffs into the lungs 2 (two) times daily. Patient not taking: Reported on 01/11/2022    [provider]  diclofenac (VOLTAREN) 75 MG EC tablet Take 1 tablet (75 mg total) by mouth 2 (two) times daily. Patient not taking: Reported on 01/11/2022 11/27/21   Elson Areas, PA-C  doxycycline (VIBRAMYCIN) 100 MG capsule Take 100 mg by mouth 2 (two) times daily. Patient not taking: Reported on 01/11/2022 08/26/21   [provider]  hydrochlorothiazide (HYDRODIURIL) 12.5 MG tablet Take 1 tablet (12.5 mg total) by mouth daily. 01/18/22 02/17/22  Glyn Ade, MD  meloxicam (MOBIC) 15 MG tablet Take 1 tablet (15 mg total) by mouth daily. 12/16/21 12/16/22  Elson Areas, PA-C  pantoprazole (PROTONIX) 20 MG tablet Take 1 tablet (20 mg total) by mouth daily for 14 days. 01/18/22 02/01/22  Glyn Ade, MD    Family History Family History  Problem Relation Age of Onset   Hypertension Mother    Diabetes Mother    Arthritis Mother    Hypercholesterolemia Mother    Emphysema Father    Cancer Other    Diabetes Other    Colon cancer Neg Hx    Gastric cancer Neg Hx    Esophageal cancer Neg Hx     Social History Social History   Tobacco Use   Smoking status: Former    Packs/day: 1.00    Years: 20.00    Total pack years: 20.00    Types: Cigarettes    Quit date: 08/20/2013    Years since quitting: 8.7   Smokeless tobacco: Never  Vaping Use   Vaping Use: Never used  Substance Use Topics   Alcohol use: Not Currently    Comment: in the past   Drug use: Not Currently    Types: Marijuana    Comment: denied 05/05/19     Allergies   Doxepin, Lisinopril, Omeprazole, and Pneumococcal vaccines   Review of Systems Review of Systems  Musculoskeletal:  Positive for back pain.  All other systems reviewed and are negative.    Physical Exam Triage Vital Signs ED  Triage Vitals  Enc Vitals Group     BP 05/08/22 1357 (!) 169/85     Pulse Rate 05/08/22 1357 77     Resp 05/08/22 1357 20     Temp 05/08/22 1357 99 F (37.2 C)     Temp Source 05/08/22 1357 Oral     SpO2 05/08/22 1357 93 %     Weight --  Height --      Head Circumference --      Peak Flow --      Pain Score 05/08/22 1354 10     Pain Loc --      Pain Edu? --      Excl. in GC? --    No data found.  Updated Vital Signs BP (!) 169/85 (BP Location: Left Arm)   Pulse 77   Temp 99 F (37.2 C) (Oral)   Resp 20   SpO2 93%   Visual Acuity Right Eye Distance:   Left Eye Distance:   Bilateral Distance:    Right Eye Near:   Left Eye Near:    Bilateral Near:     Physical Exam Vitals and nursing note reviewed.  Constitutional:      Appearance: She is well-developed.  HENT:     Head: Normocephalic.  Cardiovascular:     Rate and Rhythm: Normal rate.  Pulmonary:     Effort: Pulmonary effort is normal.  Abdominal:     General: There is no distension.  Musculoskeletal:        General: Tenderness present.     Cervical back: Normal range of motion.     Comments: Diffusely tender lumbar spine,  pain with movement, nv and ns intact   Skin:    General: Skin is warm.  Neurological:     Mental Status: She is alert and oriented to person, place, and time.  Psychiatric:        Mood and Affect: Mood normal.      UC Treatments / Results  Labs (all labs ordered are listed, but only abnormal results are displayed) Labs Reviewed - No data to display  EKG   Radiology No results found.  Procedures Procedures (including critical care time)  Medications Ordered in UC Medications  ketorolac (TORADOL) injection 60 mg (60 mg Intramuscular Given 05/08/22 1423)    Initial Impression / Assessment and Plan / UC Course  I have reviewed the triage vital signs and the nursing notes.  Pertinent labs & imaging results that were available during my care of the patient were  reviewed by me and considered in my medical decision making (see chart for details).     MDM:  Pt moaning and crying during history and exam.  History is provided by family member. Pt given injection of toradol.  Pt advised to continue home medications.   Final Clinical Impressions(s) / UC Diagnoses   Final diagnoses:  None     Discharge Instructions      Follow up with your Physician at the [facility] for pain as scheduled    ED Prescriptions   None    PDMP not reviewed this encounter.   Elson Areas, PA-C 05/08/22 1511        Elson Areas, [place] 05/08/22 1636

## 2022-05-24 NOTE — Progress Notes (Unsigned)
GI Office Note    Referring Provider: [facility] Primary Care Physician:  [facility]  Primary Gastroenterologist: Gerrit Friends.Rourk, MD   Chief Complaint   No chief complaint on file.   History of Present Illness   Vicki Dean is a 61 y.o. female presenting today at the request of Center[facility] for ***GERD.  Patient noted to have history of IDA, heme positive stool, constipation, and GERD.  EGD and colonoscopy in March 2019.  EGD with mild erosive reflux esophagitis s/p dilation.  Colonoscopy with grade 2 hemorrhoids, advised repeat in 10 years.  Last seen in the office 05/05/2019.  Reported issue with constipation, sometimes having to strain and having lower abdominal pain.  Denied any melena.  Possibly a small amount of BRBPR with straining.  Patient unsure the name of her heartburn medication, stated she was not taking omeprazole but has heartburn every day.  Does have sleep apnea, uses a CPAP.  Primary complaint of regurgitation at night.  Does elevate her head when sleeping.  Abdominal pain is intermittent and not daily, usually centralized.  Abdominal pain worse with her constipation.  Patient encouraged to complete labs to complete workup of anemia.  Antireflux measures reinforced.  Patient was to bring in medication list in order to make adjustments.  Per referal paperwork from the [place], she is currently on amlodipine, esomeprazole 20 mg daily, HCTZ 12.5 mg daily, meloxicam 1 tablet daily.  Nabumetone 500 mg twice daily, pantoprazole 40 mg twice daily, cyclobenzaprine 10 mg twice daily, venlafaxine 50 mg daily.   [facility] visit patient requested GI consult and optometry consult.  Was having chest pain, shortness of breath, and hypertension.  She is known to be noncompliant with all medications for the prior 6 months.  Also admitted to heavy cocaine use and EtOH use for the last 2 days.  She was noted to have rapid speech and excitation as  well as excessive movement.  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  She was referred to the ED for evaluation of possible ACS in the setting of hypertensive emergency and drug use.  Patient declined EMS transport, was to go to the ED with her daughter.  GI referral given for GERD, noted to have history of esophageal stricture.  Most recent labs at the [place] with normal LFTs, hematocrit 37.7, A1c 5.6, bilirubin 0.4, vitamin D 17.5  Most recent labs in [facility] system, 01/18/2022: Hemoglobin 12, hematocrit 37.6, MCV 69.8.  Today: Taking esomeprazole and pantoprazole?  GERD - dysphagia?   Current Outpatient Medications  Medication Sig Dispense Refill   albuterol (PROVENTIL HFA;VENTOLIN HFA) 108 (90 Base) MCG/ACT inhaler Inhale 1-2 puffs into the lungs every 6 (six) hours as needed for wheezing or shortness of breath. (Patient not taking: Reported on 01/11/2022)     amLODipine (NORVASC) 5 MG tablet Take 1 tablet (5 mg total) by mouth daily. 30 tablet 0   budesonide-formoterol (SYMBICORT) 160-4.5 MCG/ACT inhaler Inhale 2 puffs into the lungs 2 (two) times daily. (Patient not taking: Reported on 01/11/2022)     diclofenac (VOLTAREN) 75 MG EC tablet Take 1 tablet (75 mg total) by mouth 2 (two) times daily. (Patient not taking: Reported on 01/11/2022) 20 tablet 0   doxycycline (VIBRAMYCIN) 100 MG capsule Take 100 mg by mouth 2 (two) times daily. (Patient not taking: Reported on 01/11/2022)     esomeprazole (NEXIUM) 20 MG capsule TAKE ONE CAPSULE  BY MOUTH EVERY DAY FOR GASTROESOPHAGEAL REFLUX DISEASE     hydrochlorothiazide (HYDRODIURIL) 12.5 MG tablet Take 1 tablet (12.5 mg total) by mouth daily. 30 tablet 0   lurasidone (LATUDA) 20 MG TABS tablet TAKE ONE TABLET BY MOUTH EVERY EVENING (TAKE WITH EVENING MEAL)     meloxicam (MOBIC) 15 MG tablet Take 1 tablet (15 mg total) by mouth daily. 30 tablet 2   pantoprazole (PROTONIX) 20 MG tablet Take 1 tablet (20 mg total) by  mouth daily for 14 days. 14 tablet 0   traZODone (DESYREL) 100 MG tablet TAKE 1 TO 2 TABLET(S) BY MOUTH EVERY NIGHT AT BEDTIME     venlafaxine XR (EFFEXOR-XR) 150 MG 24 hr capsule TAKE ONE CAPSULE BY MOUTH EVERY MORNING FOR MOOD     No current facility-administered medications for this visit.    Past Medical History:  Diagnosis Date   Anxiety    Arthritis    possibly her right shoulder.   Chronic back pain    COPD (chronic obstructive pulmonary disease) (HCC)    Fibromyalgia    GERD (gastroesophageal reflux disease)    Hypertension    Sleep apnea    could not tolerate    Past Surgical History:  Procedure Laterality Date   ABDOMINAL HYSTERECTOMY     CARPAL TUNNEL RELEASE Left    CHOLECYSTECTOMY N/A 02/25/2021   Procedure: LAPAROSCOPIC CHOLECYSTECTOMY;  Surgeon: Franky Macho, MD;  Location: [facility];  Service: General;  Laterality: N/A;   COLONOSCOPY WITH PROPOFOL N/A 08/23/2017   Dr. Jena Gauss, grade II hemorrhoids. next TCS in 10 years.    ESOPHAGOGASTRODUODENOSCOPY (EGD) WITH PROPOFOL N/A 08/23/2017   Dr. Jena Gauss: mild erosive reflux esophagitis s/p dilation due to h/o dysphagia.    FOOT SURGERY Right    screws from fracture   GANGLION CYST EXCISION Right    foot   KNEE SURGERY Right    arthroscopy   MALONEY DILATION N/A 08/23/2017   Procedure: Elease Hashimoto DILATION;  Surgeon: Corbin Ade, MD;  [facility];  Service: Endoscopy;  Laterality: N/A;    Family History  Problem Relation Age of Onset   Hypertension Mother    Diabetes Mother    Arthritis Mother    Hypercholesterolemia Mother    Emphysema Father    Cancer Other    Diabetes Other    Colon cancer Neg Hx    Gastric cancer Neg Hx    Esophageal cancer Neg Hx     Allergies as of 05/25/2022 - Review Complete 05/08/2022  Allergen Reaction Noted   Doxepin  07/31/2017   Lisinopril  06/28/2017   Omeprazole  06/28/2017   Pneumococcal vaccines  07/31/2017    Social History   Socioeconomic History   Marital  status: Single    Spouse name: Not on file   Number of children: Not on file   Years of education: Not on file   Highest education level: Not on file  Occupational History   Not on file  Tobacco Use   Smoking status: Former    Packs/day: 1.00    Years: 20.00    Total pack years: 20.00    Types: Cigarettes    Quit date: 08/20/2013    Years since quitting: 8.7   Smokeless tobacco: Never  Vaping Use   Vaping Use: Never used  Substance and Sexual Activity   Alcohol use: Not Currently    Comment: in the past   Drug use: Not Currently    Types: Marijuana  Comment: denied 05/05/19   Sexual activity: Yes    Birth control/protection: Surgical  Other Topics Concern   Not on file  Social History Narrative   Not on file   Social Determinants of Health   Financial Resource Strain: Not on file  Food Insecurity: Not on file  Transportation Needs: Not on file  Physical Activity: Not on file  Stress: Not on file  Social Connections: Not on file  Intimate Partner Violence: Not on file     Review of Systems   Gen: Denies any fever, chills, fatigue, weight loss, lack of appetite.  CV: Denies chest pain, heart palpitations, peripheral edema, syncope.  Resp: Denies shortness of breath at rest or with exertion. Denies wheezing or cough.  GI: see HPI GU : Denies urinary burning, urinary frequency, urinary hesitancy MS: Denies joint pain, muscle weakness, cramps, or limitation of movement.  Derm: Denies rash, itching, dry skin Psych: Denies depression, anxiety, memory loss, and confusion Heme: Denies bruising, bleeding, and enlarged lymph nodes.   Physical Exam   There were no vitals taken for this visit.  General:   Alert and oriented. Pleasant and cooperative. Well-nourished and well-developed.  Head:  Normocephalic and atraumatic. Eyes:  Without icterus, sclera clear and conjunctiva pink.  Ears:  Normal auditory acuity. Mouth:  No deformity or lesions, oral mucosa pink.   Lungs:  Clear to auscultation bilaterally. No wheezes, rales, or rhonchi. No distress.  Heart:  S1, S2 present without murmurs appreciated.  Abdomen:  +BS, soft, non-tender and non-distended. No HSM noted. No guarding or rebound. No masses appreciated.  Rectal:  Deferred  Msk:  Symmetrical without gross deformities. Normal posture. Extremities:  Without edema. Neurologic:  Alert and  oriented x4;  grossly normal neurologically. Skin:  Intact without significant lesions or rashes. Psych:  Alert and cooperative. Normal mood and affect.   Assessment   Vicki Dean is a 60 y.o. female with a history of IDA, GERD, tobacco use, substance use disorder, tarsal tunnel syndrome, hypertension, anxiety, chronic back pain*** presenting today for evaluation of GERD.  GERD:   Microcytic anemia:    PLAN   ***    Brooke Bonito, MSN, FNP-BC, AGACNP-BC [facility]

## 2022-05-25 ENCOUNTER — Encounter: Payer: Self-pay | Admitting: *Deleted

## 2022-05-25 ENCOUNTER — Encounter: Payer: Self-pay | Admitting: Gastroenterology

## 2022-05-25 ENCOUNTER — Ambulatory Visit (INDEPENDENT_AMBULATORY_CARE_PROVIDER_SITE_OTHER): Payer: No Typology Code available for payment source | Admitting: Gastroenterology

## 2022-05-25 VITALS — BP 158/88 | HR 97 | Temp 98.1°F | Ht 67.0 in | Wt 185.2 lb

## 2022-05-25 DIAGNOSIS — R1310 Dysphagia, unspecified: Secondary | ICD-10-CM

## 2022-05-25 DIAGNOSIS — I10 Essential (primary) hypertension: Secondary | ICD-10-CM

## 2022-05-25 DIAGNOSIS — K2100 Gastro-esophageal reflux disease with esophagitis, without bleeding: Secondary | ICD-10-CM | POA: Diagnosis not present

## 2022-05-25 DIAGNOSIS — K5904 Chronic idiopathic constipation: Secondary | ICD-10-CM

## 2022-05-25 MED ORDER — PANTOPRAZOLE SODIUM 40 MG PO TBEC: 40.0000 mg | DELAYED_RELEASE_TABLET | Freq: Two times a day (BID) | ORAL | 3 refills | Status: DC

## 2022-05-25 NOTE — Patient Instructions (Addendum)
Stop taking Nexium.  Want you to start taking pantoprazole 40 mg twice daily, 30 minutes prior to breakfast and dinner.  You may use famotidine over-the-counter as needed for any breakthrough symptoms.  Follow a GERD diet:  Avoid fried, fatty, greasy, spicy, citrus foods. Avoid caffeine and carbonated beverages. Avoid chocolate. Try eating 4-6 small meals a day rather than 3 large meals. Do not eat within 3 hours of laying down. Prop head of bed up on wood or bricks to create a 6 inch incline. Avoid alcohol intake.  For your constipation I want you to start taking Linzess 145 mcg daily, 30 minutes prior to breakfast.  I am providing you some samples today.  I want you to call in 2-3 weeks and let me know how you are doing, if it is working I can send in prescription.  I want you to increase your water intake and your fiber intake.  You may supplement with Benefiber 2-3 teaspoons daily.  You may use over-the-counter Preparation H for any possible hemorrhoids.  Blood pressure slightly elevated, if it remains elevated which you to follow-up with your PCP for change in blood pressure medicine.  I hope you have a wonderful Christmas and happy year!  It was a pleasure to see you today. I want to create trusting relationships with patients. If you receive a survey regarding your visit,  I greatly appreciate you taking time to fill this out on paper or through your MyChart. I value your feedback.  Brooke Bonito, MSN, FNP-BC, AGACNP-BC [facility]

## 2022-06-13 ENCOUNTER — Ambulatory Visit (INDEPENDENT_AMBULATORY_CARE_PROVIDER_SITE_OTHER): Payer: No Typology Code available for payment source | Admitting: Podiatry

## 2022-06-13 ENCOUNTER — Other Ambulatory Visit: Payer: Self-pay | Admitting: Podiatry

## 2022-06-13 ENCOUNTER — Ambulatory Visit (INDEPENDENT_AMBULATORY_CARE_PROVIDER_SITE_OTHER): Payer: No Typology Code available for payment source

## 2022-06-13 DIAGNOSIS — L84 Corns and callosities: Secondary | ICD-10-CM | POA: Diagnosis not present

## 2022-06-13 DIAGNOSIS — Q828 Other specified congenital malformations of skin: Secondary | ICD-10-CM

## 2022-06-13 DIAGNOSIS — M216X2 Other acquired deformities of left foot: Secondary | ICD-10-CM | POA: Diagnosis not present

## 2022-06-13 DIAGNOSIS — M216X1 Other acquired deformities of right foot: Secondary | ICD-10-CM

## 2022-06-13 MED ORDER — AMMONIUM LACTATE 12 % EX LOTN: 1.0000 | TOPICAL_LOTION | CUTANEOUS | 0 refills | Status: DC | PRN

## 2022-06-13 NOTE — Progress Notes (Signed)
Subjective:  Patient ID: Vicki Dean, female    DOB: 1960/11/03,  MRN: [id number]  Chief Complaint  Patient presents with   Callouses    Bilateral callouses causing discomfort     61 y.o. female presents with the above complaint.  Patient presents with bilateral fifth metatarsal plantarflexed metatarsal pain on palpation hurts with ambulation hyperkeratotic lesion to submetatarsal 5 hurts with ambulation she is try conservative care with shoe gear modification padding protective none which has helped.  She would like to discuss surgical options at this time she has not seen anyone else prior to seeing me for this.   Review of Systems: Negative except as noted in the HPI. Denies N/V/F/Ch.  Past Medical History:  Diagnosis Date   Anxiety    Arthritis    possibly her right shoulder.   Chronic back pain    COPD (chronic obstructive pulmonary disease) (HCC)    Fibromyalgia    GERD (gastroesophageal reflux disease)    Hypertension    Sleep apnea    could not tolerate    Current Outpatient Medications:    ammonium lactate (AMLACTIN) 12 % lotion, Apply 1 Application topically as needed for dry skin., Disp: 400 g, Rfl: 0   albuterol (PROVENTIL HFA;VENTOLIN HFA) 108 (90 Base) MCG/ACT inhaler, Inhale 1-2 puffs into the lungs every 6 (six) hours as needed for wheezing or shortness of breath., Disp: , Rfl:    amLODipine (NORVASC) 5 MG tablet, Take 1 tablet (5 mg total) by mouth daily., Disp: 30 tablet, Rfl: 0   budesonide-formoterol (SYMBICORT) 160-4.5 MCG/ACT inhaler, Inhale 2 puffs into the lungs 2 (two) times daily., Disp: , Rfl:    cefdinir (OMNICEF) 300 MG capsule, Take 300 mg by mouth 2 (two) times daily., Disp: , Rfl:    diclofenac (VOLTAREN) 75 MG EC tablet, Take 1 tablet (75 mg total) by mouth 2 (two) times daily., Disp: 20 tablet, Rfl: 0   doxycycline (VIBRAMYCIN) 100 MG capsule, Take 100 mg by mouth 2 (two) times daily., Disp: , Rfl:    hydrochlorothiazide (HYDRODIURIL) 12.5 MG  tablet, Take 1 tablet (12.5 mg total) by mouth daily., Disp: 30 tablet, Rfl: 0   lurasidone (LATUDA) 20 MG TABS tablet, TAKE ONE TABLET BY MOUTH EVERY EVENING (TAKE WITH EVENING MEAL), Disp: , Rfl:    meloxicam (MOBIC) 15 MG tablet, Take 1 tablet (15 mg total) by mouth daily., Disp: 30 tablet, Rfl: 2   pantoprazole (PROTONIX) 40 MG tablet, Take 1 tablet (40 mg total) by mouth 2 (two) times daily before a meal., Disp: 90 tablet, Rfl: 3   traZODone (DESYREL) 100 MG tablet, TAKE 1 TO 2 TABLET(S) BY MOUTH EVERY NIGHT AT BEDTIME, Disp: , Rfl:    venlafaxine XR (EFFEXOR-XR) 150 MG 24 hr capsule, TAKE ONE CAPSULE BY MOUTH EVERY MORNING FOR MOOD, Disp: , Rfl:   Social History   Tobacco Use  Smoking Status Former   Packs/day: 1.00   Years: 20.00   Total pack years: 20.00   Types: Cigarettes   Quit date: 08/20/2013   Years since quitting: 8.8  Smokeless Tobacco Never    Allergies  Allergen Reactions   Doxepin     Pt unsure of reaction   Lisinopril     cough   Omeprazole     Doesn't agree with her   Pneumococcal Vaccines     Arm swelling   Objective:  There were no vitals filed for this visit. There is no height or weight on file to  calculate BMI. Constitutional Well developed. Well nourished.  Vascular Dorsalis pedis pulses palpable bilaterally. Posterior tibial pulses palpable bilaterally. Capillary refill normal to all digits.  No cyanosis or clubbing noted. Pedal hair growth normal.  Neurologic Normal speech. Oriented to person, place, and time. Epicritic sensation to light touch grossly present bilaterally.  Dermatologic Nails well groomed and normal in appearance. No open wounds. No skin lesions.  Orthopedic: Bilateral plantarflexed fifth metatarsal noted pain on palpation to submetatarsal 5.   Radiographs: 3 views of skeletally mature the bilateral foot: Plantarflexed fifth metatarsal noted pes planovalgus foot structure noted previous hardware noted.  Previous osteotomy  noted to the fifth metatarsal. Assessment:   1. 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 Plantar flexed metatarsal bone of left foot    Plan:  Patient was evaluated and treated and all questions answered.  Bilateral fifth metatarsal plantarflexed -All questions and concerns were discussed with the patient in extensive detail given that she has failed all conservative care I believe she will benefit from floating osteotomy of the fifth metatarsal bone bilaterally.  I discussed my preoperative intra or postoperative plan in extensive detail she states understand like to proceed with surgery -She can be weightbearing as tolerated surgical shoe bilaterally -Informed surgical risk consent was reviewed and read aloud to the patient.  I reviewed the films.  I have discussed my findings with the patient in great detail.  I have discussed all risks including but not limited to infection, stiffness, scarring, limp, disability, deformity, damage to blood vessels and nerves, numbness, poor healing, need for braces, arthritis, chronic pain, amputation, death.  All benefits and realistic expectations discussed in great detail.  I have made no promises as to the outcome.  I have provided realistic expectations.  I have offered the patient a 2nd opinion, which they have declined and assured me they preferred to proceed despite the risks   No follow-ups on file.

## 2022-06-28 NOTE — Patient Instructions (Signed)
Vicki Dean  06/28/2022     @PREFPERIOPPHARMACY @   Your procedure is scheduled on  07/05/2022.   Report to [facility] at  0600  A.M.   Call this number if you have problems the morning of surgery:  [phone]  If you experience any cold or flu symptoms such as cough, fever, chills, shortness of breath, etc. between now and your scheduled surgery, please notify us at the above number.   Remember:    Follow the diet instructions given to you by the office.        Use your inhalers before you come and bring your rescue inhaler with you.   Take these medicines the morning of surgery with A SIP OF WATER        amlodipine, voltaren,meloxicam, protonix, effexor.     Do not wear jewelry, make-up or nail polish.  Do not wear lotions, powders, or perfumes, or deodorant.  Do not shave 48 hours prior to surgery.  Men may shave face and neck.  Do not bring valuables to the hospital.  [facility] is not responsible for any belongings or valuables.  Contacts, dentures or bridgework may not be worn into surgery.  Leave your suitcase in the car.  After surgery it may be brought to your room.  For patients admitted to the hospital, discharge time will be determined by your treatment team.  Patients discharged the day of surgery will not be allowed to drive home and must have someone with them for 24 hours.    Special instructions:   DO NOT smoke tobacco or vape for 24 hours before your procedure.  Please read over the following fact sheets that you were given. Anesthesia Post-op Instructions and Care and Recovery After Surgery      Upper Endoscopy, Adult, Care After After the procedure, it is common to have a sore throat. It is also common to have: Mild stomach pain or discomfort. Bloating. Nausea. Follow these instructions at home: The instructions below may help you care for yourself at home. Your health care provider may give you more instructions. If you have  questions, ask your health care provider. If you were given a sedative during the procedure, it can affect you for several hours. Do not drive or operate machinery until your health care provider says that it is safe. If you will be going home right after the procedure, plan to have a responsible adult: Take you home from the hospital or clinic. You will not be allowed to drive. Care for you for the time you are told. Follow instructions from your health care provider about what you may eat and drink. Return to your normal activities as told by your health care provider. Ask your health care provider what activities are safe for you. Take over-the-counter and prescription medicines only as told by your health care provider. Contact a health care provider if you: Have a sore throat that lasts longer than one day. Have trouble swallowing. Have a fever. Get help right away if you: Vomit blood or your vomit looks like coffee grounds. Have bloody, black, or tarry stools. Have a very bad sore throat or you cannot swallow. Have difficulty breathing or very bad pain in your chest or abdomen. These symptoms may be an emergency. Get help right away. Call 911. Do not wait to see if the symptoms will go away. Do not drive yourself to the hospital. Summary After the procedure,  it is common to have a sore throat, mild stomach discomfort, bloating, and nausea. If you were given a sedative during the procedure, it can affect you for several hours. Do not drive until your health care provider says that it is safe. Follow instructions from your health care provider about what you may eat and drink. Return to your normal activities as told by your health care provider. This information is not intended to replace advice given to you by your health care provider. Make sure you discuss any questions you have with your health care provider. Document Revised: 09/14/2021 Document Reviewed: 09/14/2021 Elsevier  Patient Education  [place]. Esophageal Dilatation Esophageal dilatation, also called esophageal dilation, is a procedure to widen or open a blocked or narrowed part of the esophagus. The esophagus is the part of the body that moves food and liquid from the mouth to the stomach. You may need this procedure if: You have a buildup of scar tissue in your esophagus that makes it difficult, painful, or impossible to swallow. This can be caused by gastroesophageal reflux disease (GERD). You have cancer of the esophagus. There is a problem with how food moves through your esophagus. In some cases, you may need this procedure repeated at a later time to dilate the esophagus gradually. Tell a health care provider about: Any allergies you have. All medicines you are taking, including vitamins, herbs, eye drops, creams, and over-the-counter medicines. Any problems you or family members have had with anesthetic medicines. Any blood disorders you have. Any surgeries you have had. Any medical conditions you have. Any antibiotic medicines you are required to take before dental procedures. Whether you are pregnant or may be pregnant. What are the risks? Generally, this is a safe procedure. However, problems may occur, including: Bleeding due to a tear in the lining of the esophagus. A hole, or perforation, in the esophagus. What happens before the procedure? Ask your health care provider about: Changing or stopping your regular medicines. This is especially important if you are taking diabetes medicines or blood thinners. Taking medicines such as aspirin and ibuprofen. These medicines can thin your blood. Do not take these medicines unless your health care provider tells you to take them. Taking over-the-counter medicines, vitamins, herbs, and supplements. Follow instructions from your health care provider about eating or drinking restrictions. Plan to have a responsible adult take you home from  the hospital or clinic. Plan to have a responsible adult care for you for the time you are told after you leave the hospital or clinic. This is important. What happens during the procedure? You may be given a medicine to help you relax (sedative). A numbing medicine may be sprayed into the back of your throat, or you may gargle the medicine. Your health care provider may perform the dilatation using various surgical instruments, such as: Simple dilators. This instrument is carefully placed in the esophagus to stretch it. Guided wire bougies. This involves using an endoscope to insert a wire into the esophagus. A dilator is passed over this wire to enlarge the esophagus. Then the wire is removed. Balloon dilators. An endoscope with a small balloon is inserted into the esophagus. The balloon is inflated to stretch the esophagus and open it up. The procedure may vary among health care providers and hospitals. What can I expect after the procedure? Your blood pressure, heart rate, breathing rate, and blood oxygen level will be monitored until you leave the hospital or clinic. Your throat may  feel slightly sore and numb. This will get better over time. You will not be allowed to eat or drink until your throat is no longer numb. When you are able to drink, urinate, and sit on the edge of the bed without nausea or dizziness, you may be able to return home. Follow these instructions at home: Take over-the-counter and prescription medicines only as told by your health care provider. If you were given a sedative during the procedure, it can affect you for several hours. Do not drive or operate machinery until your health care provider says that it is safe. Plan to have a responsible adult care for you for the time you are told. This is important. Follow instructions from your health care provider about any eating or drinking restrictions. Do not use any products that contain nicotine or tobacco, such as  cigarettes, e-cigarettes, and chewing tobacco. If you need help quitting, ask your health care provider. Keep all follow-up visits. This is important. Contact a health care provider if: You have a fever. You have pain that is not relieved by medicine. Get help right away if: You have chest pain. You have trouble breathing. You have trouble swallowing. You vomit blood. You have black, tarry, or bloody stools. These symptoms may represent a serious problem that is an emergency. Do not wait to see if the symptoms will go away. Get medical help right away. Call your local emergency services (911 in the [place] Do not drive yourself to the hospital. Summary Esophageal dilatation, also called esophageal dilation, is a procedure to widen or open a blocked or narrowed part of the esophagus. Plan to have a responsible adult take you home from the hospital or clinic. For this procedure, a numbing medicine may be sprayed into the back of your throat, or you may gargle the medicine. Do not drive or operate machinery until your health care provider says that it is safe. This information is not intended to replace advice given to you by your health care provider. Make sure you discuss any questions you have with your health care provider. Document Revised: 10/22/2019 Document Reviewed: 10/22/2019 Elsevier Patient Education  2023 Else[employer] Inc. Monitored Anesthesia Care, Care After The following information offers guidance on how to care for yourself after your procedure. Your health care provider may also give you more specific instructions. If you have problems or questions, contact your health care provider. What can I expect after the procedure? After the procedure, it is common to have: Tiredness. Little or no memory about what happened during or after the procedure. Impaired judgment when it comes to making decisions. Nausea or vomiting. Some trouble with balance. Follow these instructions at  home: For the time period you were told by your health care provider:  Rest. Do not participate in activities where you could fall or become injured. Do not drive or use machinery. Do not drink alcohol. Do not take sleeping pills or medicines that cause drowsiness. Do not make important decisions or sign legal documents. Do not take care of children on your own. Medicines Take over-the-counter and prescription medicines only as told by your health care provider. If you were prescribed antibiotics, take them as told by your health care provider. Do not stop using the antibiotic even if you start to feel better. Eating and drinking Follow instructions from your health care provider about what you may eat and drink. Drink enough fluid to keep your urine pale yellow. If you vomit: Drink clear fluids  slowly and in small amounts as you are able. Clear fluids include water, ice chips, low-calorie sports drinks, and fruit juice that has water added to it (diluted fruit juice). Eat light and bland foods in small amounts as you are able. These foods include bananas, applesauce, rice, lean meats, toast, and crackers. General instructions  Have a responsible adult stay with you for the time you are told. It is important to have someone help care for you until you are awake and alert. If you have sleep apnea, surgery and some medicines can increase your risk for breathing problems. Follow instructions from your health care provider about wearing your sleep device: When you are sleeping. This includes during daytime naps. While taking prescription pain medicines, sleeping medicines, or medicines that make you drowsy. Do not use any products that contain nicotine or tobacco. These products include cigarettes, chewing tobacco, and vaping devices, such as e-cigarettes. If you need help quitting, ask your health care provider. Contact a health care provider if: You feel nauseous or vomit every time you eat  or drink. You feel light-headed. You are still sleepy or having trouble with balance after 24 hours. You get a rash. You have a fever. You have redness or swelling around the IV site. Get help right away if: You have trouble breathing. You have new confusion after you get home. These symptoms may be an emergency. Get help right away. Call 911. Do not wait to see if the symptoms will go away. Do not drive yourself to the hospital. This information is not intended to replace advice given to you by your health care provider. Make sure you discuss any questions you have with your health care provider. Document Revised: 10/31/2021 Document Reviewed: 10/31/2021 Elsevier Patient Education  2023 [employer].

## 2022-07-03 ENCOUNTER — Encounter (HOSPITAL_COMMUNITY)
Admission: RE | Admit: 2022-07-03 | Discharge: 2022-07-03 | Disposition: A | Discharge status: Home / Self Care | Payer: No Typology Code available for payment source | Source: Ambulatory Visit | Attending: Internal Medicine | Admitting: Internal Medicine

## 2022-07-03 ENCOUNTER — Other Ambulatory Visit: Payer: Self-pay

## 2022-07-03 ENCOUNTER — Encounter (HOSPITAL_COMMUNITY): Payer: Self-pay

## 2022-07-03 VITALS — BP 151/90 | HR 71 | Temp 98.5°F | Resp 18 | Ht 67.0 in | Wt 183.0 lb

## 2022-07-03 DIAGNOSIS — Z79899 Other long term (current) drug therapy: Secondary | ICD-10-CM

## 2022-07-03 DIAGNOSIS — D649 Anemia, unspecified: Secondary | ICD-10-CM | POA: Insufficient documentation

## 2022-07-03 DIAGNOSIS — Z01812 Encounter for preprocedural laboratory examination: Secondary | ICD-10-CM | POA: Diagnosis not present

## 2022-07-03 LAB — CBC WITH DIFFERENTIAL/PLATELET
Abs Immature Granulocytes: 0.02 10*3/uL (ref 0.00–0.07)
Basophils Absolute: 0 10*3/uL (ref 0.0–0.1)
Basophils Relative: 0 %
Eosinophils Absolute: 0.1 10*3/uL (ref 0.0–0.5)
Eosinophils Relative: 1 %
HCT: 39.9 % (ref 36.0–46.0)
Hemoglobin: 12.4 g/dL (ref 12.0–15.0)
Immature Granulocytes: 0 %
Lymphocytes Relative: 32 %
Lymphs Abs: 2 10*3/uL (ref 0.7–4.0)
MCH: 21.9 pg — ABNORMAL LOW (ref 26.0–34.0)
MCHC: 31.1 g/dL (ref 30.0–36.0)
MCV: 70.6 fL — ABNORMAL LOW (ref 80.0–100.0)
Monocytes Absolute: 0.5 10*3/uL (ref 0.1–1.0)
Monocytes Relative: 8 %
Neutro Abs: 3.8 10*3/uL (ref 1.7–7.7)
Neutrophils Relative %: 59 %
Platelets: 241 10*3/uL (ref 150–400)
RBC: 5.65 MIL/uL — ABNORMAL HIGH (ref 3.87–5.11)
RDW: 15.8 % — ABNORMAL HIGH (ref 11.5–15.5)
WBC: 6.5 10*3/uL (ref 4.0–10.5)
nRBC: 0 % (ref 0.0–0.2)

## 2022-07-03 LAB — BASIC METABOLIC PANEL
Anion gap: 13 (ref 5–15)
BUN: 18 mg/dL (ref 8–23)
CO2: 22 mmol/L (ref 22–32)
Calcium: 8.6 mg/dL — ABNORMAL LOW (ref 8.9–10.3)
Chloride: 105 mmol/L (ref 98–111)
Creatinine, Ser: 0.95 mg/dL (ref 0.44–1.00)
GFR, Estimated: >60 mL/min (ref >60)
Glucose, Bld: 80 mg/dL (ref 70–99)
Potassium: 3.1 mmol/L — ABNORMAL LOW (ref 3.5–5.1)
Sodium: 140 mmol/L (ref 135–145)

## 2022-07-03 NOTE — Progress Notes (Signed)
Completed PAT interview. Pt verbalized understand of instructions and when to arrive. Pt given handout.

## 2022-07-04 ENCOUNTER — Other Ambulatory Visit: Payer: Self-pay

## 2022-07-04 MED ORDER — POTASSIUM CHLORIDE CRYS ER 20 MEQ PO TBCR: 40.0000 meq | EXTENDED_RELEASE_TABLET | Freq: Every day | ORAL | 0 refills | Status: AC

## 2022-07-05 ENCOUNTER — Ambulatory Visit (HOSPITAL_COMMUNITY): Payer: No Typology Code available for payment source | Admitting: Certified Registered Nurse Anesthetist

## 2022-07-05 ENCOUNTER — Encounter (HOSPITAL_COMMUNITY): Payer: Self-pay | Admitting: Internal Medicine

## 2022-07-05 ENCOUNTER — Ambulatory Visit (HOSPITAL_COMMUNITY)
Admission: RE | Admit: 2022-07-05 | Discharge: 2022-07-05 | Disposition: A | Discharge status: Home / Self Care | Payer: No Typology Code available for payment source | Source: Ambulatory Visit | Attending: Internal Medicine | Admitting: Internal Medicine

## 2022-07-05 ENCOUNTER — Encounter (HOSPITAL_COMMUNITY)
Admission: RE | Disposition: A | Discharge status: Home / Self Care | Payer: Self-pay | Source: Ambulatory Visit | Attending: Internal Medicine

## 2022-07-05 DIAGNOSIS — I10 Essential (primary) hypertension: Secondary | ICD-10-CM | POA: Insufficient documentation

## 2022-07-05 DIAGNOSIS — Z8249 Family history of ischemic heart disease and other diseases of the circulatory system: Secondary | ICD-10-CM | POA: Insufficient documentation

## 2022-07-05 DIAGNOSIS — J449 Chronic obstructive pulmonary disease, unspecified: Secondary | ICD-10-CM | POA: Diagnosis not present

## 2022-07-05 DIAGNOSIS — K449 Diaphragmatic hernia without obstruction or gangrene: Secondary | ICD-10-CM | POA: Insufficient documentation

## 2022-07-05 DIAGNOSIS — K219 Gastro-esophageal reflux disease without esophagitis: Secondary | ICD-10-CM | POA: Diagnosis not present

## 2022-07-05 DIAGNOSIS — Z87891 Personal history of nicotine dependence: Secondary | ICD-10-CM | POA: Insufficient documentation

## 2022-07-05 DIAGNOSIS — R1310 Dysphagia, unspecified: Secondary | ICD-10-CM

## 2022-07-05 DIAGNOSIS — F418 Other specified anxiety disorders: Secondary | ICD-10-CM | POA: Insufficient documentation

## 2022-07-05 DIAGNOSIS — M797 Fibromyalgia: Secondary | ICD-10-CM | POA: Diagnosis not present

## 2022-07-05 DIAGNOSIS — G4730 Sleep apnea, unspecified: Secondary | ICD-10-CM | POA: Diagnosis not present

## 2022-07-05 HISTORY — PX: MALONEY DILATION: SHX5535

## 2022-07-05 HISTORY — PX: ESOPHAGOGASTRODUODENOSCOPY (EGD) WITH PROPOFOL: SHX5813

## 2022-07-05 SURGERY — ESOPHAGOGASTRODUODENOSCOPY (EGD) WITH PROPOFOL
Anesthesia: General

## 2022-07-05 MED ORDER — PROPOFOL 500 MG/50ML IV EMUL
INTRAVENOUS | Status: AC
  Filled 2022-07-05: qty 50

## 2022-07-05 MED ORDER — PROPOFOL 500 MG/50ML IV EMUL
INTRAVENOUS | Status: DC | PRN
  Administered 2022-07-05: 180 ug/kg/min via INTRAVENOUS

## 2022-07-05 MED ORDER — LACTATED RINGERS IV SOLN: INTRAVENOUS | Status: DC

## 2022-07-05 MED ORDER — STERILE WATER FOR IRRIGATION IR SOLN
Status: DC | PRN
  Administered 2022-07-05: 60 mL

## 2022-07-05 MED ORDER — PROPOFOL 10 MG/ML IV BOLUS
INTRAVENOUS | Status: DC | PRN
  Administered 2022-07-05: 80 mg via INTRAVENOUS

## 2022-07-05 NOTE — Anesthesia Preprocedure Evaluation (Signed)
Anesthesia Evaluation  Patient identified by MRN, date of birth, ID band Patient awake    Reviewed: Allergy & Precautions, H&P , NPO status , Patient's Chart, lab work & pertinent test results, reviewed documented beta blocker date and time   Airway Mallampati: II  TM Distance: >3 FB Neck ROM: full    Dental no notable dental hx.    Pulmonary neg pulmonary ROS, sleep apnea , COPD, Patient abstained from smoking., former smoker   Pulmonary exam normal breath sounds clear to auscultation       Cardiovascular Exercise Tolerance: Good hypertension, negative cardio ROS  Rhythm:regular Rate:Normal     Neuro/Psych  Headaches PSYCHIATRIC DISORDERS Anxiety Depression     Neuromuscular disease negative neurological ROS  negative psych ROS   GI/Hepatic negative GI ROS, Neg liver ROS,GERD  ,,  Endo/Other  negative endocrine ROS    Renal/GU Renal diseasenegative Renal ROS  negative genitourinary   Musculoskeletal   Abdominal   Peds  Hematology negative hematology ROS (+) Blood dyscrasia, anemia   Anesthesia Other Findings   Reproductive/Obstetrics negative OB ROS                             Anesthesia Physical Anesthesia Plan  ASA: 3  Anesthesia Plan: General   Post-op Pain Management:    Induction:   PONV Risk Score and Plan: Propofol infusion  Airway Management Planned:   Additional Equipment:   Intra-op Plan:   Post-operative Plan:   Informed Consent: I have reviewed the patients History and Physical, chart, labs and discussed the procedure including the risks, benefits and alternatives for the proposed anesthesia with the patient or authorized representative who has indicated his/her understanding and acceptance.     Dental Advisory Given  Plan Discussed with: CRNA  Anesthesia Plan Comments:        Anesthesia Quick Evaluation

## 2022-07-05 NOTE — Anesthesia Postprocedure Evaluation (Signed)
Anesthesia Post Note  Patient: Vicki Dean  Procedure(s) Performed: ESOPHAGOGASTRODUODENOSCOPY (EGD) WITH PROPOFOL [place]  Patient location during evaluation: Phase II Anesthesia Type: General Level of consciousness: awake Pain management: pain level controlled Vital Signs Assessment: post-procedure vital signs reviewed and stable Respiratory status: spontaneous breathing and respiratory function stable Cardiovascular status: blood pressure returned to baseline and stable Postop Assessment: no headache and no apparent nausea or vomiting Anesthetic complications: no Comments: Late entry   No notable events documented.   Last Vitals:  Vitals:   07/05/22 0639 07/05/22 0750  BP: (!) 153/87 116/62  Pulse: 68 77  Resp: 18 16  Temp: 37 C 36.8 C  SpO2: 100% 98%    Last Pain:  Vitals:   07/05/22 0750  TempSrc: Oral  PainSc: [place]

## 2022-07-05 NOTE — Op Note (Signed)
[facility] Patient Name: Vicki Dean Procedure Date: 07/05/2022 7:09 AM MRN: [id number] Date of Birth: 07-05-1960 Attending MD: Gennette Pac , MD, [id number] CSN: [id number] Age: 62 Admit Type: Outpatient Procedure:                Upper GI endoscopy Indications:              Dysphagia, Heartburn Providers:                Gennette Pac, MD, Angelica Ran, Lennice Sites                            Technician, Technician Referring MD:              Medicines:                Propofol per Anesthesia Complications:            No immediate complications. Estimated Blood Loss:     Estimated blood loss was minimal. Procedure:                Pre-Anesthesia Assessment:                           - Prior to the procedure, a History and Physical                            was performed, and patient medications and                            allergies were reviewed. The patient's tolerance of                            previous anesthesia was also reviewed. The risks                            and benefits of the procedure and the sedation                            options and risks were discussed with the patient.                            All questions were answered, and informed consent                            was obtained. Prior Anticoagulants: The patient has                            taken no anticoagulant or antiplatelet agents. ASA                            Grade Assessment: III - A patient with severe                            systemic disease. After reviewing the risks and  benefits, the patient was deemed in satisfactory                            condition to undergo the procedure.                           After obtaining informed consent, the endoscope was                            passed under direct vision. Throughout the                            procedure, the patient's blood pressure, pulse, and                            oxygen  saturations were monitored continuously. The                            GIF-H190 ([id number]) scope was introduced through the                            mouth, and advanced to the second part of duodenum.                            The upper GI endoscopy was accomplished without                            difficulty. The patient tolerated the procedure                            well. Scope In: 7:38:08 AM Scope Out: 7:43:51 AM Total Procedure Duration: 0 hours 5 minutes 43 seconds  Findings:      The examined esophagus was normal.      A small hiatal hernia was present.      The duodenal bulb and second portion of the duodenum were normal. The       scope was withdrawn. Dilation was performed with a Maloney dilator with       mild resistance at 108 Fr. The scope was withdrawn. Dilation was       performed with a Maloney dilator with mild resistance at 56 Fr. The       dilation site was examined following endoscope reinsertion and showed no       change. Estimated blood loss was minimal. Impression:               - Normal esophagus. Dilated.                           - Small hiatal hernia.                           - Normal duodenal bulb and second portion of the                            duodenum.                           -  No specimens collected. Moderate Sedation:      Moderate (conscious) sedation was personally administered by an       anesthesia professional. The following parameters were monitored: oxygen       saturation, heart rate, blood pressure, respiratory rate, EKG, adequacy       of pulmonary ventilation, and response to care. Recommendation:           - Patient has a contact number available for                            emergencies. The signs and symptoms of potential                            delayed complications were discussed with the                            patient. Return to normal activities tomorrow.                            Written discharge instructions  were provided to the                            patient.                           - Resume previous diet.                           - Continue present medications.                           - Return to my office in 3 months. Procedure Code(s):        --- Professional ---                           [id number], Esophagogastroduodenoscopy, flexible,                            transoral; diagnostic, including collection of                            specimen(s) by brushing or washing, when performed                            (separate procedure)                           43450, Dilation of esophagus, by unguided sound or                            bougie, single or multiple passes Diagnosis Code(s):        --- Professional ---                           K44.9, Diaphragmatic hernia without obstruction or  gangrene                           R13.10, Dysphagia, unspecified                           R12, Heartburn CPT copyright 2022 American Medical Association. All rights reserved. The codes documented in this report are preliminary and upon coder review may  be revised to meet current compliance requirements. Cristopher Estimable. Makinsley Schiavi, MD Norvel Richards, MD 07/05/2022 7:54:00 AM This report has been signed electronically. Number of Addenda: 0

## 2022-07-05 NOTE — H&P (Signed)
@LOGO @   Primary Care Physician:  [facility] Primary Gastroenterologist:  Dr. Gala Romney  Pre-Procedure History & Physical: HPI:  Vicki Dean is a 62 y.o. female here for   Past Medical History:  Diagnosis Date   Anxiety    Arthritis    possibly her right shoulder.   Chronic back pain    COPD (chronic obstructive pulmonary disease) (HCC)    Fibromyalgia    GERD (gastroesophageal reflux disease)    Hypertension    Sleep apnea    could not tolerate    Past Surgical History:  Procedure Laterality Date   ABDOMINAL HYSTERECTOMY     CARPAL TUNNEL RELEASE Left    CHOLECYSTECTOMY N/A 02/25/2021   Procedure: LAPAROSCOPIC CHOLECYSTECTOMY;  Surgeon: Aviva Signs, MD;  Location: [facility] ORS;  Service: General;  Laterality: N/A;   COLONOSCOPY WITH PROPOFOL N/A 08/23/2017   Dr. Gala Romney, grade II hemorrhoids. next TCS in 10 years.    ESOPHAGOGASTRODUODENOSCOPY (EGD) WITH PROPOFOL N/A 08/23/2017   Dr. Gala Romney: mild erosive reflux esophagitis s/p dilation due to h/o dysphagia.    FOOT SURGERY Right    screws from fracture   GANGLION CYST EXCISION Right    foot   KNEE SURGERY Right    arthroscopy   MALONEY DILATION N/A 08/23/2017   Procedure: Venia Minks DILATION;  Surgeon: Daneil Dolin, MD;  Location: [facility] END[facility] SUITE;  Service: Endoscopy;  Laterality: N/A;    Prior to Admission medications   Medication Sig Start Date End Date Taking? Authorizing Provider  albuterol (PROVENTIL HFA;VENTOLIN HFA) 108 (90 Base) MCG/ACT inhaler Inhale 1-2 puffs into the lungs every 6 (six) hours as needed for wheezing or shortness of breath.   Yes [provider]  amLODipine (NORVASC) 5 MG tablet Take 1 tablet (5 mg total) by mouth daily. Patient taking differently: Take 10 mg by mouth daily. 01/18/22 06/29/22 Yes Countryman, Cheri Rous, MD  ammonium lactate (AMLACTIN) 12 % lotion Apply 1 Application topically as needed for dry skin. 06/13/22  Yes Felipa Furnace, DPM  budesonide-formoterol (SYMBICORT) 160-4.5  MCG/ACT inhaler Inhale 2 puffs into the lungs 2 (two) times daily as needed (Shortness of breath).   Yes [provider]  esomeprazole (NEXIUM) 20 MG capsule Take 20 mg by mouth daily at 12 noon.   Yes [provider]  hydrochlorothiazide (HYDRODIURIL) 12.5 MG tablet Take 1 tablet (12.5 mg total) by mouth daily. 01/18/22 06/29/22 Yes Countryman, Cheri Rous, MD  lurasidone (LATUDA) 20 MG TABS tablet TAKE ONE TABLET BY MOUTH EVERY EVENING (TAKE WITH EVENING MEAL) 04/21/22  Yes [provider]  meloxicam (MOBIC) 15 MG tablet Take 1 tablet (15 mg total) by mouth daily. 12/16/21 12/16/22 Yes Fransico Meadow, PA-C  Multiple Vitamins-Minerals (MULTIVITAMIN WITH MINERALS) tablet Take 1 tablet by mouth daily.   Yes [provider]  pantoprazole (PROTONIX) 40 MG tablet Take 1 tablet (40 mg total) by mouth 2 (two) times daily before a meal. 05/25/22  Yes Mahon, Courtney L, NP  potassium chloride SA (KLOR-CON M) 20 MEQ tablet Take 2 tablets (40 mEq total) by mouth daily. Patient not taking: Reported on 07/05/2022 07/04/22   Daneil Dolin, MD  sodium chloride (OCEAN) 0.65 % SOLN nasal spray Place 1 spray into both nostrils as needed for congestion.   Yes [provider]  traZODone (DESYREL) 100 MG tablet TAKE 1 TO 2 TABLET(S) BY MOUTH EVERY NIGHT AT BEDTIME 04/26/22  Yes [provider]  venlafaxine XR (EFFEXOR-XR) 150 MG 24 hr capsule  TAKE ONE CAPSULE BY MOUTH EVERY MORNING FOR MOOD 04/21/22  Yes [provider]  diclofenac (VOLTAREN) 75 MG EC tablet Take 1 tablet (75 mg total) by mouth 2 (two) times daily. Patient not taking: Reported on 06/29/2022 11/27/21   Fransico Meadow, PA-C    Allergies as of 05/25/2022 - Review Complete 05/25/2022  Allergen Reaction Noted   Doxepin  07/31/2017   Lisinopril  06/28/2017   Omeprazole  06/28/2017   Pneumococcal vaccines  07/31/2017    Family History  Problem Relation Age of Onset   Hypertension Mother    Diabetes  Mother    Arthritis Mother    Hypercholesterolemia Mother    Emphysema Father    Cancer Other    Diabetes Other    Colon cancer Neg Hx    Gastric cancer Neg Hx    Esophageal cancer Neg Hx     Social History   Socioeconomic History   Marital status: Single    Spouse name: Not on file   Number of children: Not on file   Years of education: Not on file   Highest education level: Not on file  Occupational History   Not on file  Tobacco Use   Smoking status: Former    Packs/day: 1.00    Years: 20.00    Total pack years: 20.00    Types: Cigarettes    Quit date: 08/20/2013    Years since quitting: 8.8   Smokeless tobacco: Never  Vaping Use   Vaping Use: Never used  Substance and Sexual Activity   Alcohol use: Not Currently    Comment: in the past   Drug use: Not Currently    Types: Marijuana    Comment: denied 05/05/19   Sexual activity: Yes    Birth control/protection: Surgical  Other Topics Concern   Not on file  Social History Narrative   Not on file   Social Determinants of Health   Financial Resource Strain: Not on file  Food Insecurity: Not on file  Transportation Needs: Not on file  Physical Activity: Not on file  Stress: Not on file  Social Connections: Not on file  Intimate Partner Violence: Not on file    Review of Systems: See HPI, otherwise negative ROS  Physical Exam: BP (!) 153/87   Pulse 68   Temp 98.6 F (37 C) (Oral)   Resp 18   Ht 5\' 7"  (1.702 m)   Wt 83 kg   SpO2 100%   BMI 28.66 kg/m  General:   Alert,  Well-developed, well-nourished, pleasant and cooperative in NAD Neck:  Supple; no masses or thyromegaly. No significant cervical adenopathy. Lungs:  Clear throughout to auscultation.   No wheezes, crackles, or rhonchi. No acute distress. Heart:  Regular rate and rhythm; no murmurs, clicks, rubs,  or gallops. Abdomen: Non-distended, normal bowel sounds.  Soft and nontender without appreciable mass or hepatosplenomegaly.  Pulses:   Normal pulses noted. Extremities:  Without clubbing or edema.  Impression/Plan: 62 year old lady with poorly controlled GERD and esophageal dysphagia.  Here for further evaluation of offered the patient a EGD with possible esophageal dilation as feasible/appropriate today per plan.  The risks, benefits, limitations, alternatives and imponderables have been reviewed with the patient. Potential for esophageal dilation, biopsy, etc. have also been reviewed.  Questions have been answered. All parties agreeable.   Patient may need an updated colonoscopy later this year.   Notice: This dictation was prepared with Dragon dictation along with smaller phrase technology. Any  transcriptional errors that result from this process are unintentional and may not be corrected upon review.

## 2022-07-05 NOTE — Transfer of Care (Signed)
Immediate Anesthesia Transfer of Care Note  Patient: Vicki Dean  Procedure(s) Performed: ESOPHAGOGASTRODUODENOSCOPY (EGD) WITH PROPOFOL MALONEY DILATION  Patient Location: PACU  Anesthesia Type:General  Level of Consciousness: awake, alert , and oriented  Airway & Oxygen Therapy: Patient Spontanous Breathing  Post-op Assessment: Report given to RN, Post -op Vital signs reviewed and stable, Patient moving all extremities X 4, and Patient able to stick tongue midline  Post vital signs: Reviewed  Last Vitals:  Vitals Value Taken Time  BP 116/62   Temp 98.3   Pulse 73   Resp 17   SpO2 100     Last Pain:  Vitals:   07/05/22 0733  TempSrc:   PainSc: 7       Patients Stated Pain Goal: 8 (95/32/02 3343)  Complications: No notable events documented.

## 2022-07-05 NOTE — Discharge Instructions (Addendum)
EGD Discharge instructions Please read the instructions outlined below and refer to this sheet in the next few weeks. These discharge instructions provide you with general information on caring for yourself after you leave the hospital. Your doctor may also give you specific instructions. While your treatment has been planned according to the most current medical practices available, unavoidable complications occasionally occur. If you have any problems or questions after discharge, please call your doctor. ACTIVITY You may resume your regular activity but move at a slower pace for the next 24 hours.  Take frequent rest periods for the next 24 hours.  Walking will help expel (get rid of) the air and reduce the bloated feeling in your abdomen.  No driving for 24 hours (because of the anesthesia (medicine) used during the test).  You may shower.  Do not sign any important legal documents or operate any machinery for 24 hours (because of the anesthesia used during the test).  NUTRITION Drink plenty of fluids.  You may resume your normal diet.  Begin with a light meal and progress to your normal diet.  Avoid alcoholic beverages for 24 hours or as instructed by your caregiver.  MEDICATIONS You may resume your normal medications unless your caregiver tells you otherwise.  WHAT YOU CAN EXPECT TODAY You may experience abdominal discomfort such as a feeling of fullness or "gas" pains.  FOLLOW-UP Your doctor will discuss the results of your test with you.  SEEK IMMEDIATE MEDICAL ATTENTION IF ANY OF THE FOLLOWING OCCUR: Excessive nausea (feeling sick to your stomach) and/or vomiting.  Severe abdominal pain and distention (swelling).  Trouble swallowing.  Temperature over 101 F (37.8 C).  Rectal bleeding or vomiting of blood.     You have a small hiatal hernia.  Your esophagus appeared normal.  It was dilated today  Continue taking Protonix 40 mg 30 minutes before breakfast and supper  daily  Office visit with Venetia Night in 3 months  At patient request, I called Selena Price at [phone]-call rolled to voicemail.  I left a message.

## 2022-07-07 ENCOUNTER — Telehealth: Payer: Self-pay | Admitting: Podiatry

## 2022-07-07 NOTE — Telephone Encounter (Signed)
DOS: 08/07/2022  [place]  Metatarsal Osteotomy 5th B/L [phone])  [place] Authorization on file is valid from  05/08/2022 - 12/10/2022.

## 2022-07-11 ENCOUNTER — Encounter (HOSPITAL_COMMUNITY): Payer: Self-pay | Admitting: Internal Medicine

## 2022-08-01 ENCOUNTER — Telehealth: Payer: Self-pay | Admitting: *Deleted

## 2022-08-01 NOTE — Telephone Encounter (Signed)
error 

## 2022-08-02 ENCOUNTER — Ambulatory Visit (INDEPENDENT_AMBULATORY_CARE_PROVIDER_SITE_OTHER): Payer: No Typology Code available for payment source | Admitting: Podiatry

## 2022-08-02 DIAGNOSIS — Q828 Other specified congenital malformations of skin: Secondary | ICD-10-CM

## 2022-08-02 MED ORDER — AMMONIUM LACTATE 12 % EX LOTN: 1.0000 | TOPICAL_LOTION | CUTANEOUS | 0 refills | Status: AC | PRN

## 2022-08-02 NOTE — Progress Notes (Signed)
Subjective:  Patient ID: Vicki Dean, female    DOB: 04-14-1961,  MRN: [id number]  Chief Complaint  Patient presents with   Callouses    62 y.o. female presents with the above complaint.  Patient presents with bilateral fifth metatarsal plantarflexed metatarsal pain on palpation hurts with ambulation hyperkeratotic lesion to submetatarsal 5 hurts with ambulation she is try conservative care with shoe gear modification padding protective none which has helped.  She would like to discuss surgical options at this time she has not seen anyone else prior to seeing me for this.  She also presents with secondary complaint of bilateral porokeratotic lesion.  They are painful to touch.  She would like for me to debride down and discuss options for orthotics and shoe gear modification   Review of Systems: Negative except as noted in the HPI. Denies N/V/F/Ch.  Past Medical History:  Diagnosis Date   Anxiety    Arthritis    possibly her right shoulder.   Chronic back pain    COPD (chronic obstructive pulmonary disease) (HCC)    Fibromyalgia    GERD (gastroesophageal reflux disease)    Hypertension    Sleep apnea    could not tolerate    Current Outpatient Medications:    potassium chloride SA (KLOR-CON M) 20 MEQ tablet, Take 2 tablets (40 mEq total) by mouth daily. (Patient not taking: Reported on 07/05/2022), Disp: 6 tablet, Rfl: 0   albuterol (PROVENTIL HFA;VENTOLIN HFA) 108 (90 Base) MCG/ACT inhaler, Inhale 1-2 puffs into the lungs every 6 (six) hours as needed for wheezing or shortness of breath., Disp: , Rfl:    amLODipine (NORVASC) 5 MG tablet, Take 1 tablet (5 mg total) by mouth daily. (Patient taking differently: Take 10 mg by mouth daily.), Disp: 30 tablet, Rfl: 0   ammonium lactate (AMLACTIN) 12 % lotion, Apply 1 Application topically as needed for dry skin., Disp: 400 g, Rfl: 0   budesonide-formoterol (SYMBICORT) 160-4.5 MCG/ACT inhaler, Inhale 2 puffs into the lungs 2 (two) times  daily as needed (Shortness of breath)., Disp: , Rfl:    diclofenac (VOLTAREN) 75 MG EC tablet, Take 1 tablet (75 mg total) by mouth 2 (two) times daily. (Patient not taking: Reported on 06/29/2022), Disp: 20 tablet, Rfl: 0   hydrochlorothiazide (HYDRODIURIL) 12.5 MG tablet, Take 1 tablet (12.5 mg total) by mouth daily., Disp: 30 tablet, Rfl: 0   lurasidone (LATUDA) 20 MG TABS tablet, TAKE ONE TABLET BY MOUTH EVERY EVENING (TAKE WITH EVENING MEAL), Disp: , Rfl:    meloxicam (MOBIC) 15 MG tablet, Take 1 tablet (15 mg total) by mouth daily., Disp: 30 tablet, Rfl: 2   Multiple Vitamins-Minerals (MULTIVITAMIN WITH MINERALS) tablet, Take 1 tablet by mouth daily., Disp: , Rfl:    pantoprazole (PROTONIX) 40 MG tablet, Take 1 tablet (40 mg total) by mouth 2 (two) times daily before a meal., Disp: 90 tablet, Rfl: 3   sodium chloride (OCEAN) 0.65 % SOLN nasal spray, Place 1 spray into both nostrils as needed for congestion., Disp: , Rfl:    traZODone (DESYREL) 100 MG tablet, TAKE 1 TO 2 TABLET(S) BY MOUTH EVERY NIGHT AT BEDTIME, Disp: , Rfl:    venlafaxine XR (EFFEXOR-XR) 150 MG 24 hr capsule, TAKE ONE CAPSULE BY MOUTH EVERY MORNING FOR MOOD, Disp: , Rfl:   Social History   Tobacco Use  Smoking Status Former   Packs/day: 1.00   Years: 20.00   Total pack years: 20.00   Types: Cigarettes   Quit date:  08/20/2013   Years since quitting: 8.9  Smokeless Tobacco Never    Allergies  Allergen Reactions   Doxepin     Pt unsure of reaction   Lisinopril     cough   Omeprazole     Doesn't agree with her   Pneumococcal Vaccines     Arm swelling   Objective:  There were no vitals filed for this visit. There is no height or weight on file to calculate BMI. Constitutional Well developed. Well nourished.  Vascular Dorsalis pedis pulses palpable bilaterally. Posterior tibial pulses palpable bilaterally. Capillary refill normal to all digits.  No cyanosis or clubbing noted. Pedal hair growth normal.   Neurologic Normal speech. Oriented to person, place, and time. Epicritic sensation to light touch grossly present bilaterally.  Dermatologic Nails well groomed and normal in appearance. No open wounds. No skin lesions.  Orthopedic: Bilateral plantarflexed fifth metatarsal noted pain on palpation to submetatarsal 5.   Radiographs: 3 views of skeletally mature the bilateral foot: Plantarflexed fifth metatarsal noted pes planovalgus foot structure noted previous hardware noted.  Previous osteotomy noted to the fifth metatarsal. Assessment:   No diagnosis found.  Plan:  Patient was evaluated and treated and all questions answered.  Bilateral fifth metatarsal plantarflexed -All questions and concerns were discussed with the patient in extensive detail given that she has failed all conservative care I believe she will benefit from floating osteotomy of the fifth metatarsal bone bilaterally.  I discussed my preoperative intra or postoperative plan in extensive detail she states understand like to proceed with surgery -She can be weightbearing as tolerated surgical shoe bilaterally -Informed surgical risk consent was reviewed and read aloud to the patient.  I reviewed the films.  I have discussed my findings with the patient in great detail.  I have discussed all risks including but not limited to infection, stiffness, scarring, limp, disability, deformity, damage to blood vessels and nerves, numbness, poor healing, need for braces, arthritis, chronic pain, amputation, death.  All benefits and realistic expectations discussed in great detail.  I have made no promises as to the outcome.  I have provided realistic expectations.  I have offered the patient a 2nd opinion, which they have declined and assured me they preferred to proceed despite the risks  Heel porokeratosis -She will benefit from orthotics to offload bilateral heel to take the pressure away.  She states she will obtain it from the  [place].   No follow-ups on file.

## 2022-08-07 ENCOUNTER — Encounter: Payer: Self-pay | Admitting: Podiatry

## 2022-08-07 ENCOUNTER — Other Ambulatory Visit: Payer: Self-pay | Admitting: Podiatry

## 2022-08-07 ENCOUNTER — Telehealth: Payer: Self-pay | Admitting: Podiatry

## 2022-08-07 DIAGNOSIS — M21541 Acquired clubfoot, right foot: Secondary | ICD-10-CM | POA: Diagnosis not present

## 2022-08-07 DIAGNOSIS — M21542 Acquired clubfoot, left foot: Secondary | ICD-10-CM | POA: Diagnosis not present

## 2022-08-07 MED ORDER — IBUPROFEN 800 MG PO TABS: 800.0000 mg | ORAL_TABLET | Freq: Four times a day (QID) | ORAL | 1 refills | Status: AC | PRN

## 2022-08-07 MED ORDER — OXYCODONE-ACETAMINOPHEN 5-325 MG PO TABS: 1.0000 | ORAL_TABLET | ORAL | 0 refills | Status: DC | PRN

## 2022-08-07 NOTE — Telephone Encounter (Signed)
Umm I'm really not sure what to do in this case? - Dr. Amalia Hailey

## 2022-08-07 NOTE — Telephone Encounter (Signed)
Received call from [facility] in [place] and pt is there to get her  percocet filled and they are trying to bill her medicaid( [employer]) and it needs a prior [place]. Her id number is [id number] for medicaid. Pt was upset that they could not fill the medication.She did have surgery today.  We do not have this on file as filed [employer] and they do not take [employer].

## 2022-08-08 NOTE — Telephone Encounter (Signed)
Hey Ammie can you assist with this pt?  Please advise

## 2022-08-08 NOTE — Telephone Encounter (Signed)
Called patient, she has [place].Patient stated that she will go ahead and pick up medication since it was $35.00 at [facility]. We offered to send it to the [place], patient declined.

## 2022-08-10 ENCOUNTER — Telehealth: Payer: Self-pay | Admitting: *Deleted

## 2022-08-10 NOTE — Telephone Encounter (Signed)
Patient is calling for post op instructions, spoke with the patient going over those instructions, verbalized understanding, has an upcoming appointment on the 27 th, was concerned about the swelling, explained to elevate and try icing behind the upper calf /knee.

## 2022-08-15 ENCOUNTER — Encounter: Payer: No Typology Code available for payment source | Admitting: Podiatry

## 2022-08-16 ENCOUNTER — Ambulatory Visit (INDEPENDENT_AMBULATORY_CARE_PROVIDER_SITE_OTHER): Payer: No Typology Code available for payment source | Admitting: Podiatry

## 2022-08-16 ENCOUNTER — Ambulatory Visit (INDEPENDENT_AMBULATORY_CARE_PROVIDER_SITE_OTHER): Payer: No Typology Code available for payment source

## 2022-08-16 DIAGNOSIS — Z9889 Other specified postprocedural states: Secondary | ICD-10-CM

## 2022-08-16 DIAGNOSIS — M216X1 Other acquired deformities of right foot: Secondary | ICD-10-CM

## 2022-08-16 DIAGNOSIS — M216X2 Other acquired deformities of left foot: Secondary | ICD-10-CM

## 2022-08-16 NOTE — Progress Notes (Signed)
Subjective:  Patient ID: Vicki Dean, female    DOB: [birth year],  MRN: [id number]  Chief Complaint  Patient presents with   Routine Post Op    POV#1 DOS 02.19.24 METATARSAL OSTEOTOMY 5TH B/L    DOS: 08/07/2022 Procedure: Bilateral fifth metatarsal osteotomy floating  62 y.o. female returns for post-op check.  Patient states that she is doing well.  Denies any other acute complaints.  Bandages clean dry and intact.  Review of Systems: Negative except as noted in the HPI. Denies N/V/F/Ch.  Past Medical History:  Diagnosis Date   Anxiety    Arthritis    possibly her right shoulder.   Chronic back pain    COPD (chronic obstructive pulmonary disease) (HCC)    Fibromyalgia    GERD (gastroesophageal reflux disease)    Hypertension    Sleep apnea    could not tolerate    Current Outpatient Medications:    potassium chloride SA (KLOR-CON M) 20 MEQ tablet, Take 2 tablets (40 mEq total) by mouth daily. (Patient not taking: Reported on 07/05/2022), Disp: 6 tablet, Rfl: 0   albuterol (PROVENTIL HFA;VENTOLIN HFA) 108 (90 Base) MCG/ACT inhaler, Inhale 1-2 puffs into the lungs every 6 (six) hours as needed for wheezing or shortness of breath., Disp: , Rfl:    amLODipine (NORVASC) 5 MG tablet, Take 1 tablet (5 mg total) by mouth daily. (Patient taking differently: Take 10 mg by mouth daily.), Disp: 30 tablet, Rfl: 0   ammonium lactate (AMLACTIN) 12 % lotion, Apply 1 Application topically as needed for dry skin., Disp: 400 g, Rfl: 0   budesonide-formoterol (SYMBICORT) 160-4.5 MCG/ACT inhaler, Inhale 2 puffs into the lungs 2 (two) times daily as needed (Shortness of breath)., Disp: , Rfl:    diclofenac (VOLTAREN) 75 MG EC tablet, Take 1 tablet (75 mg total) by mouth 2 (two) times daily. (Patient not taking: Reported on 06/29/2022), Disp: 20 tablet, Rfl: 0   hydrochlorothiazide (HYDRODIURIL) 12.5 MG tablet, Take 1 tablet (12.5 mg total) by mouth daily., Disp: 30 tablet, Rfl: 0   ibuprofen (ADVIL)  800 MG tablet, Take 1 tablet (800 mg total) by mouth every 6 (six) hours as needed., Disp: 60 tablet, Rfl: 1   lurasidone (LATUDA) 20 MG TABS tablet, TAKE ONE TABLET BY MOUTH EVERY EVENING (TAKE WITH EVENING MEAL), Disp: , Rfl:    meloxicam (MOBIC) 15 MG tablet, Take 1 tablet (15 mg total) by mouth daily., Disp: 30 tablet, Rfl: 2   Multiple Vitamins-Minerals (MULTIVITAMIN WITH MINERALS) tablet, Take 1 tablet by mouth daily., Disp: , Rfl:    oxyCODONE-acetaminophen (PERCOCET) 5-325 MG tablet, Take 1 tablet by mouth every 4 (four) hours as needed for severe pain., Disp: 30 tablet, Rfl: 0   pantoprazole (PROTONIX) 40 MG tablet, Take 1 tablet (40 mg total) by mouth 2 (two) times daily before a meal., Disp: 90 tablet, Rfl: 3   sodium chloride (OCEAN) 0.65 % SOLN nasal spray, Place 1 spray into both nostrils as needed for congestion., Disp: , Rfl:    traZODone (DESYREL) 100 MG tablet, TAKE 1 TO 2 TABLET(S) BY MOUTH EVERY NIGHT AT BEDTIME, Disp: , Rfl:    venlafaxine XR (EFFEXOR-XR) 150 MG 24 hr capsule, TAKE ONE CAPSULE BY MOUTH EVERY MORNING FOR MOOD, Disp: , Rfl:   Social History   Tobacco Use  Smoking Status Former   Packs/day: 1.00   Years: 20.00   Total pack years: 20.00   Types: Cigarettes   Quit date: 08/20/2013   Years since  quitting: 8.9  Smokeless Tobacco Never    Allergies  Allergen Reactions   Doxepin     Pt unsure of reaction   Lisinopril     cough   Omeprazole     Doesn't agree with her   Pneumococcal Vaccines     Arm swelling   Objective:  There were no vitals filed for this visit. There is no height or weight on file to calculate BMI. Constitutional Well developed. Well nourished.  Vascular Foot warm and well perfused. Capillary refill normal to all digits.   Neurologic Normal speech. Oriented to person, place, and time. Epicritic sensation to light touch grossly present bilaterally.  Dermatologic Skin healing well without signs of infection. Skin edges well  coapted without signs of infection.  Orthopedic: Tenderness to palpation noted about the surgical site.   Radiographs: 3 views of skeletally mature adult bilateral floating osteotomy good correction alignment noted.  Reduction of plantarflexion noted. Assessment:   1. 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 Plantar flexed metatarsal bone of left foot   3. Status post foot surgery    Plan:  Patient was evaluated and treated and all questions answered.  S/p foot surgery bilaterally -Progressing as expected post-operatively. -XR: See above -WB Status: Weightbearing as tolerated in surgical shoe -Sutures: Intact.  No clinical signs of Deis is noted no complication noted. -Medications: None -Foot redressed.  No follow-ups on file.

## 2022-08-22 ENCOUNTER — Ambulatory Visit (HOSPITAL_COMMUNITY)
Admission: EM | Admit: 2022-08-22 | Discharge: 2022-08-22 | Disposition: A | Discharge status: Home / Self Care | Payer: No Typology Code available for payment source | Attending: Family Medicine | Admitting: Family Medicine

## 2022-08-22 ENCOUNTER — Encounter (HOSPITAL_COMMUNITY): Payer: Self-pay | Admitting: Emergency Medicine

## 2022-08-22 ENCOUNTER — Emergency Department (HOSPITAL_COMMUNITY): Payer: No Typology Code available for payment source

## 2022-08-22 ENCOUNTER — Emergency Department (HOSPITAL_COMMUNITY)
Admission: EM | Admit: 2022-08-22 | Discharge: 2022-08-22 | Disposition: A | Discharge status: Home / Self Care | Payer: No Typology Code available for payment source | Attending: Emergency Medicine | Admitting: Emergency Medicine

## 2022-08-22 ENCOUNTER — Other Ambulatory Visit: Payer: Self-pay

## 2022-08-22 DIAGNOSIS — Z79899 Other long term (current) drug therapy: Secondary | ICD-10-CM | POA: Insufficient documentation

## 2022-08-22 DIAGNOSIS — I10 Essential (primary) hypertension: Secondary | ICD-10-CM | POA: Insufficient documentation

## 2022-08-22 DIAGNOSIS — J069 Acute upper respiratory infection, unspecified: Secondary | ICD-10-CM | POA: Diagnosis not present

## 2022-08-22 DIAGNOSIS — Z1152 Encounter for screening for COVID-19: Secondary | ICD-10-CM | POA: Insufficient documentation

## 2022-08-22 DIAGNOSIS — R059 Cough, unspecified: Secondary | ICD-10-CM | POA: Diagnosis present

## 2022-08-22 DIAGNOSIS — J449 Chronic obstructive pulmonary disease, unspecified: Secondary | ICD-10-CM | POA: Insufficient documentation

## 2022-08-22 LAB — CBC WITH DIFFERENTIAL/PLATELET
Abs Immature Granulocytes: 0.05 10*3/uL (ref 0.00–0.07)
Basophils Absolute: 0 10*3/uL (ref 0.0–0.1)
Basophils Relative: 0 %
Eosinophils Absolute: 0.2 10*3/uL (ref 0.0–0.5)
Eosinophils Relative: 2 %
HCT: 41.4 % (ref 36.0–46.0)
Hemoglobin: 13.3 g/dL (ref 12.0–15.0)
Immature Granulocytes: 1 %
Lymphocytes Relative: 18 %
Lymphs Abs: 1.6 10*3/uL (ref 0.7–4.0)
MCH: 22.1 pg — ABNORMAL LOW (ref 26.0–34.0)
MCHC: 32.1 g/dL (ref 30.0–36.0)
MCV: 68.8 fL — ABNORMAL LOW (ref 80.0–100.0)
Monocytes Absolute: 0.5 10*3/uL (ref 0.1–1.0)
Monocytes Relative: 6 %
Neutro Abs: 6.6 10*3/uL (ref 1.7–7.7)
Neutrophils Relative %: 73 %
Platelets: 256 10*3/uL (ref 150–400)
RBC: 6.02 MIL/uL — ABNORMAL HIGH (ref 3.87–5.11)
RDW: 15.9 % — ABNORMAL HIGH (ref 11.5–15.5)
WBC: 8.9 10*3/uL (ref 4.0–10.5)
nRBC: 0 % (ref 0.0–0.2)

## 2022-08-22 LAB — COMPREHENSIVE METABOLIC PANEL
ALT: 10 U/L (ref 0–44)
AST: 17 U/L (ref 15–41)
Albumin: 3.5 g/dL (ref 3.5–5.0)
Alkaline Phosphatase: 81 U/L (ref 38–126)
Anion gap: 10 (ref 5–15)
BUN: 17 mg/dL (ref 8–23)
CO2: 24 mmol/L (ref 22–32)
Calcium: 9.1 mg/dL (ref 8.9–10.3)
Chloride: 108 mmol/L (ref 98–111)
Creatinine, Ser: 0.93 mg/dL (ref 0.44–1.00)
GFR, Estimated: >60 mL/min (ref >60)
Glucose, Bld: 101 mg/dL — ABNORMAL HIGH (ref 70–99)
Potassium: 3.7 mmol/L (ref 3.5–5.1)
Sodium: 142 mmol/L (ref 135–145)
Total Bilirubin: 0.7 mg/dL (ref 0.3–1.2)
Total Protein: 6.5 g/dL (ref 6.5–8.1)

## 2022-08-22 LAB — RESP PANEL BY RT-PCR (RSV, FLU A&B, COVID)  RVPGX2
Influenza A by PCR: NEGATIVE
Influenza B by PCR: NEGATIVE
Resp Syncytial Virus by PCR: NEGATIVE
SARS Coronavirus 2 by RT PCR: NEGATIVE

## 2022-08-22 LAB — TROPONIN I (HIGH SENSITIVITY): Troponin I (High Sensitivity): 5 ng/L (ref <18)

## 2022-08-22 MED ORDER — GUAIFENESIN ER 600 MG PO TB12
1200.0000 mg | ORAL_TABLET | Freq: Once | ORAL | Status: AC
  Administered 2022-08-22: 1200 mg via ORAL
  Filled 2022-08-22: qty 2

## 2022-08-22 MED ORDER — BENZONATATE 100 MG PO CAPS
200.0000 mg | ORAL_CAPSULE | Freq: Once | ORAL | Status: AC
  Administered 2022-08-22: 200 mg via ORAL
  Filled 2022-08-22: qty 2

## 2022-08-22 MED ORDER — GUAIFENESIN ER 1200 MG PO TB12: 1.0000 | ORAL_TABLET | Freq: Two times a day (BID) | ORAL | 0 refills | Status: DC

## 2022-08-22 MED ORDER — AMLODIPINE BESYLATE 5 MG PO TABS
10.0000 mg | ORAL_TABLET | Freq: Once | ORAL | Status: AC
  Administered 2022-08-22: 10 mg via ORAL
  Filled 2022-08-22: qty 2

## 2022-08-22 MED ORDER — BENZONATATE 100 MG PO CAPS: 100.0000 mg | ORAL_CAPSULE | Freq: Three times a day (TID) | ORAL | 0 refills | Status: AC

## 2022-08-22 NOTE — ED Provider Triage Note (Signed)
Emergency Medicine Provider Triage Evaluation Note  KELTY ASTI , a 62 y.o. female  was evaluated in triage.  Pt complains of chest pain.  She reports chest pain began yesterday has been worsening over the course of today.  Also reports significant cough, chills, nausea.  Denies any fevers at home.  Reports possible sick contact several days ago over the weekend but nothing obvious or known at this time.  Review of Systems  Positive: As above Negative: As above  Physical Exam  BP (!) 185/105 (BP Location: Right Arm)   Pulse 70   Temp 98.3 F (36.8 C)   Resp 19   Ht '5\' 7"'$  (1.702 m)   Wt 81.6 kg   SpO2 99%   BMI 28.19 kg/m  Gen:   Awake, no distress   Resp:  Normal effort  MSK:   Moves extremities without difficulty  Other:    Medical Decision Making  Medically screening exam initiated at 7:13 PM.  Appropriate orders placed.  KYMIAH DAUBERMAN was informed that the remainder of the evaluation will be completed by another provider, this initial triage assessment does not replace that evaluation, and the importance of remaining in the ED until their evaluation is complete.     Luvenia Heller, PA-C 08/22/22 1913

## 2022-08-22 NOTE — ED Provider Notes (Signed)
[facility] Provider Note   CSN: [id number] Arrival date & time: 08/22/22  1800     History  Chief Complaint  Patient presents with   Chest Pain    Vicki Dean is a 62 y.o. female.   Chest Pain    Patient has a history of hypertension reflux arthritis COPD fibromyalgia.  Patient presents to the ED with complaints of several days of cough chest congestion, sinus congestion sneezing runny nose.  Patient also started develop some chest discomfort.  She has noticed increasing throughout the day.  Patient has had some chills.  No measured fevers.  Possible sick contacts recently.  Home Medications Prior to Admission medications   Medication Sig Start Date End Date Taking? Authorizing Provider  benzonatate (TESSALON) 100 MG capsule Take 1 capsule (100 mg total) by mouth every 8 (eight) hours. 08/22/22  Yes Dorie Rank, MD  Guaifenesin 1200 MG TB12 Take 1 tablet (1,200 mg total) by mouth 2 (two) times daily at 10 AM and 5 PM. 08/22/22  Yes Dorie Rank, MD  potassium chloride SA (KLOR-CON M) 20 MEQ tablet Take 2 tablets (40 mEq total) by mouth daily. Patient not taking: Reported on 07/05/2022 07/04/22   Rourk, Cristopher Estimable, MD  albuterol (PROVENTIL HFA;VENTOLIN HFA) 108 (90 Base) MCG/ACT inhaler Inhale 1-2 puffs into the lungs every 6 (six) hours as needed for wheezing or shortness of breath.    [provider]  amLODipine (NORVASC) 5 MG tablet Take 1 tablet (5 mg total) by mouth daily. Patient taking differently: Take 10 mg by mouth daily. 01/18/22 06/29/22  Tretha Sciara, MD  ammonium lactate (AMLACTIN) 12 % lotion Apply 1 Application topically as needed for dry skin. 08/02/22   Felipa Furnace, DPM  budesonide-formoterol (SYMBICORT) 160-4.5 MCG/ACT inhaler Inhale 2 puffs into the lungs 2 (two) times daily as needed (Shortness of breath).    [provider]  diclofenac (VOLTAREN) 75 MG EC tablet Take 1 tablet (75 mg total) by mouth 2  (two) times daily. Patient not taking: Reported on 06/29/2022 11/27/21   Fransico Meadow, PA-C  hydrochlorothiazide (HYDRODIURIL) 12.5 MG tablet Take 1 tablet (12.5 mg total) by mouth daily. 01/18/22 06/29/22  Tretha Sciara, MD  ibuprofen (ADVIL) 800 MG tablet Take 1 tablet (800 mg total) by mouth every 6 (six) hours as needed. 08/07/22   Felipa Furnace, DPM  lurasidone (LATUDA) 20 MG TABS tablet TAKE ONE TABLET BY MOUTH EVERY EVENING (TAKE WITH EVENING MEAL) 04/21/22   [provider]  meloxicam (MOBIC) 15 MG tablet Take 1 tablet (15 mg total) by mouth daily. 12/16/21 12/16/22  Fransico Meadow, PA-C  Multiple Vitamins-Minerals (MULTIVITAMIN WITH MINERALS) tablet Take 1 tablet by mouth daily.    [provider]  oxyCODONE-acetaminophen (PERCOCET) 5-325 MG tablet Take 1 tablet by mouth every 4 (four) hours as needed for severe pain. 08/07/22   Felipa Furnace, DPM  pantoprazole (PROTONIX) 40 MG tablet Take 1 tablet (40 mg total) by mouth 2 (two) times daily before a meal. 05/25/22   Mahon, Lenise Arena, NP  sodium chloride (OCEAN) 0.65 % SOLN nasal spray Place 1 spray into both nostrils as needed for congestion.    [provider]  traZODone (DESYREL) 100 MG tablet TAKE 1 TO 2 TABLET(S) BY MOUTH EVERY NIGHT AT BEDTIME 04/26/22   [provider]  venlafaxine XR (EFFEXOR-XR) 150 MG 24 hr capsule TAKE ONE CAPSULE BY MOUTH EVERY MORNING FOR MOOD 04/21/22  [provider]      Allergies    Doxepin, Lisinopril, Omeprazole, and Pneumococcal vaccines    Review of Systems   Review of Systems  Cardiovascular:  Positive for chest pain.    Physical Exam Updated Vital Signs BP (!) 185/105 (BP Location: Right Arm)   Pulse 70   Temp 98.3 F (36.8 C)   Resp 19   Ht 1.702 m ('5\' 7"'$ )   Wt 81.6 kg   SpO2 99%   BMI 28.19 kg/m  Physical Exam Vitals and nursing note reviewed.  Constitutional:      General: She is not in acute distress.    Appearance: She is  well-developed.  HENT:     Head: Normocephalic and atraumatic.     Right Ear: External ear normal.     Left Ear: External ear normal.     Nose: Congestion and rhinorrhea present.  Eyes:     General: No scleral icterus.       Right eye: No discharge.        Left eye: No discharge.     Conjunctiva/sclera: Conjunctivae normal.  Neck:     Trachea: No tracheal deviation.  Cardiovascular:     Rate and Rhythm: Normal rate and regular rhythm.  Pulmonary:     Effort: Pulmonary effort is normal. No respiratory distress.     Breath sounds: Normal breath sounds. No stridor. No wheezing or rales.  Chest:     Comments: Frequent coughing Abdominal:     General: Bowel sounds are normal. There is no distension.     Palpations: Abdomen is soft.     Tenderness: There is no abdominal tenderness. There is no guarding or rebound.  Musculoskeletal:        General: No tenderness or deformity.     Cervical back: Neck supple.  Skin:    General: Skin is warm and dry.     Findings: No rash.  Neurological:     General: No focal deficit present.     Mental Status: She is alert.     Cranial Nerves: No cranial nerve deficit, dysarthria or facial asymmetry.     Sensory: No sensory deficit.     Motor: No abnormal muscle tone or seizure activity.     Coordination: Coordination normal.  Psychiatric:        Mood and Affect: Mood normal.     ED Results / Procedures / Treatments   Labs (all labs ordered are listed, but only abnormal results are displayed) Labs Reviewed  COMPREHENSIVE METABOLIC PANEL - Abnormal; Notable for the following components:      Result Value   Glucose, Bld 101 (*)    All other components within normal limits  CBC WITH DIFFERENTIAL/PLATELET - Abnormal; Notable for the following components:   RBC 6.02 (*)    MCV 68.8 (*)    MCH 22.1 (*)    RDW 15.9 (*)    All other components within normal limits  RESP PANEL BY RT-PCR (RSV, FLU A&B, COVID)  RVPGX2  TROPONIN I (HIGH  SENSITIVITY)    EKG EKG Interpretation  Date/Time:  Tuesday August 22 2022 19:30:49 EST Ventricular Rate:  78 PR Interval:  136 QRS Duration: 94 QT Interval:  406 QTC Calculation: 462 R Axis:   18 Text Interpretation: Sinus rhythm with Premature atrial complexes in a pattern of bigeminy Otherwise normal ECG When compared with ECG of 18-Jan-2022 15:01, No significant change since last tracing Confirmed by Dorie Rank [phone]) on 08/22/2022  9:17:11 PM  Radiology DG Chest 1 View  Result Date: 08/22/2022 CLINICAL DATA:  Chest pain. EXAM: CHEST  1 VIEW COMPARISON:  Chest radiograph dated 01/18/2022. FINDINGS: No focal consolidation, pleural effusion, or pneumothorax. The cardiac silhouette is within normal limits. No acute osseous pathology. IMPRESSION: No active disease. Electronically Signed   By: Anner Crete M.D.   On: 08/22/2022 20:07    Procedures Procedures    Medications Ordered in ED Medications  benzonatate (TESSALON) capsule 200 mg (has no administration in time range)  guaiFENesin (MUCINEX) 12 hr tablet 1,200 mg (has no administration in time range)  amLODipine (NORVASC) tablet 10 mg (has no administration in time range)    ED Course/ Medical Decision Making/ A&P                             Medical Decision Making Problems Addressed: Viral upper respiratory tract infection: acute illness or injury that poses a threat to life or bodily functions  Amount and/or Complexity of Data Reviewed Labs: ordered. Radiology: ordered and independent interpretation performed.  Risk OTC drugs. Prescription drug management.   Patient presented to the ED for evaluation of cough congestion URI type symptoms with developing chest pain.  ED workup reassuring.  Symptoms not suggestive of cardiac etiology.  EKG and troponin are normal.  Patient has been coughing but there is no evidence of pneumonia her lungs are clear.  She is not wheezing.  Covid and flu are negative.  Suspect viral  etiology. Patient's blood pressure noted to be elevated this evening.  Patient states she did not take her medications.  Will give her dose of her Norvasc        Final Clinical Impression(s) / ED Diagnoses Final diagnoses:  Viral upper respiratory tract infection  Hypertension, unspecified type    Rx / DC Orders ED Discharge Orders          Ordered    benzonatate (TESSALON) 100 MG capsule  Every 8 hours        08/22/22 2134    Guaifenesin 1200 MG TB12  2 times daily        08/22/22 2134              Dorie Rank, MD 08/22/22 2138

## 2022-08-22 NOTE — ED Triage Notes (Signed)
Pt c/o centralized sharp chest pain starting yesterday worsening today. Pt also endorses cough, chills, and nausea. Denies fevers.

## 2022-08-22 NOTE — ED Notes (Signed)
This rn called patient, no answer in lobby. Pt asked if she still wanted to be seen and she said no and hung up.

## 2022-08-22 NOTE — Discharge Instructions (Signed)
Take the medications to help with your cough and congestion.  You can also take Tylenol ibuprofen for aches and pains.  Sudafed can help with nasal congestion.  Drink plenty of fluids and rest.  Your symptoms should improve over the week.  Follow-up with your doctor to be rechecked if not improving

## 2022-08-29 ENCOUNTER — Other Ambulatory Visit: Payer: No Typology Code available for payment source

## 2022-08-30 ENCOUNTER — Ambulatory Visit (INDEPENDENT_AMBULATORY_CARE_PROVIDER_SITE_OTHER): Payer: No Typology Code available for payment source

## 2022-08-30 VITALS — BP 137/75 | HR 98

## 2022-08-30 DIAGNOSIS — M216X1 Other acquired deformities of right foot: Secondary | ICD-10-CM

## 2022-08-30 DIAGNOSIS — M216X2 Other acquired deformities of left foot: Secondary | ICD-10-CM

## 2022-08-30 NOTE — Progress Notes (Signed)
Patient presents today for post op visit # 2, patient of Dr. Posey Pronto.    POV#2 DOS 02.19.24 METATARSAL OSTEOTOMY 5TH B/L    Sutures removed today without complication.  Incisions look good and no signs of infections. Patient states "my feet feel a little sore but other than that I am doing well". Advised patient that she can resume normal activity at this time. Patient verbalized understanding.    Reviewed icing and elevation. Patient will follow up with Dr. Posey Pronto as needed.

## 2022-09-13 ENCOUNTER — Ambulatory Visit: Payer: No Typology Code available for payment source | Admitting: Podiatry

## 2022-09-18 ENCOUNTER — Ambulatory Visit (INDEPENDENT_AMBULATORY_CARE_PROVIDER_SITE_OTHER): Payer: No Typology Code available for payment source | Admitting: Podiatry

## 2022-09-18 ENCOUNTER — Encounter: Payer: Self-pay | Admitting: Podiatry

## 2022-09-18 DIAGNOSIS — L600 Ingrowing nail: Secondary | ICD-10-CM | POA: Diagnosis not present

## 2022-09-18 NOTE — Patient Instructions (Signed)

## 2022-09-20 NOTE — Progress Notes (Signed)
Subjective:   Patient ID: Vicki Dean, female   DOB: 62 y.o.   MRN: [id number]   HPI Patient presents stating she has ingrown toenails of both feet but the left is worse and that it is hard for her to trim herself and she is tried to soak them and do other things   ROS      Objective:  Physical Exam  Neuro ocular status intact with incurvated medial lateral borders of the left big toe and medial border of the left second toe that are painful when pressed and make shoe gear difficult.  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 and second toe painful and on the right toes not to the same degree with no erythema edema drainage     Plan:  Patient with chronic ingrown toenails also has very dry skin which plays into this and I reviewed that fact with her she wants them corrected I recommended permanent removal she wants procedure we will do the left 1 first I allowed her to read consent form for this and after reading understanding risk she signed consent form and I went ahead today and I infiltrated the left hallux and second toe 60 mg like Marcaine mixture each remove the borders of the left big toe and the left medial second toe exposed matrix applied phenol 3 applications 30 seconds followed by alcohol lavage and sterile dressings gave instructions on soaks leave dressings on 24 hours take them off earlier if throbbing were to occur and encouraged her to call questions concerns

## 2022-10-04 ENCOUNTER — Encounter: Payer: Self-pay | Admitting: Gastroenterology

## 2022-10-04 ENCOUNTER — Ambulatory Visit: Payer: No Typology Code available for payment source | Admitting: Gastroenterology

## 2022-10-10 ENCOUNTER — Ambulatory Visit (INDEPENDENT_AMBULATORY_CARE_PROVIDER_SITE_OTHER): Payer: No Typology Code available for payment source | Admitting: Podiatry

## 2022-10-10 DIAGNOSIS — L600 Ingrowing nail: Secondary | ICD-10-CM

## 2022-10-10 DIAGNOSIS — L03032 Cellulitis of left toe: Secondary | ICD-10-CM

## 2022-10-10 MED ORDER — DOXYCYCLINE HYCLATE 100 MG PO TABS: 100.0000 mg | ORAL_TABLET | Freq: Two times a day (BID) | ORAL | 0 refills | Status: AC

## 2022-10-10 NOTE — Progress Notes (Signed)
Subjective: Vicki Dean is a 62 y.o.  female returns to office today for follow up evaluation after having left Hallux Medial, Lateral border nail avulsion performed. Patient has been soaking using epsom salt and applying topical antibiotic covered with bandaid daily. Patient denies fevers, chills, nausea, vomiting. Denies any calf pain, chest pain, SOB.   Objective:  Vitals: Reviewed  General: Well developed, nourished, in no acute distress, alert and oriented x3   Dermatology: Skin is warm, dry and supple bilateral. Medial, Lateral hallux nail border appears to be clean, dry, with mild granular tissue and surrounding scab. There is no surrounding erythema, edema, drainage/purulence. The remaining nails appear unremarkable at this time. There are no other lesions or other signs of infection present.  Mild paronychia noted  Neurovascular status: Intact. No lower extremity swelling; No pain with calf compression bilateral.  Musculoskeletal: Decreased tenderness to palpation of the Medial, Lateral hallux nail fold(s). Muscular strength within normal limits bilateral.   Assesement and Plan: S/p partial nail avulsion, doing well.  With mild paronychia  -disContinue soaking in epsom salts twice a day followed by antibiotic ointment and a band-aid as needed. Can leave uncovered at night. .  -Given that there is little bit of mild paronychia patient will benefit from doxycycline doxycycline was sent to the pharmacy. -If there is no resolve meant she will come back and see me or Dr. Charlsie Merles.  Nicholes Rough, DPM

## 2022-10-30 ENCOUNTER — Ambulatory Visit: Payer: No Typology Code available for payment source | Admitting: Gastroenterology

## 2022-10-30 ENCOUNTER — Encounter: Payer: Self-pay | Admitting: Gastroenterology

## 2022-10-30 NOTE — Progress Notes (Deleted)
GI Office Note    Referring Provider: [facility] Physician:  [facility] Gastroenterologist: ***  Date:  10/30/2022  ID:  Vicki Dean, DOB 12/26/1960, MRN [id number]   Chief Complaint   No chief complaint on file.    History of Present Illness  Vicki Dean is a 61 y.o. female with a history of GERD, IDA, tobacco use, prior substance use disorder, arthritis, hypertension, anxiety, chronic back pain presenting today for follow up.  EGD March 2019: -Moderate erosive reflux esophagitis s/p dilation  Colonoscopy March 2019: -Grade 2 hemorrhoids -Advised repeat in 10 years  Last [place] office visit patient requested GI consult and optometry consult.  Was having chest pain, shortness of breath, and hypertension.  She is known to be noncompliant with all medications for the prior 6 months.  Also admitted to heavy cocaine use and EtOH use for the last 2 days.  She was noted to have rapid speech and excitation as well as excessive movement.  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  She was referred to the ED for evaluation of possible ACS in the setting of hypertensive emergency and drug use.  Patient declined EMS transport, was to go to the ED with her daughter.  GI referral given for GERD, noted to have history of esophageal stricture.   Most recent labs at the [place] with normal LFTs, hematocrit 37.7, A1c 5.6, bilirubin 0.4, vitamin D 17.5   Most recent labs in [facility] system, 01/18/2022: Hemoglobin 12, hematocrit 37.6, MCV 69.8.  Last office visit 05/29/2022.  Patient reported taking Nexium once daily but reflux getting worse.  Sleeps with a wedge underneath her mattress.  Did report fatigue and upper abdominal pain.  At times is trying on her saliva and feels acid coming out when laying flat.  Triggers are greasy foods, chocolate, peppermint, and soda.  Pills getting hung at the base of her neck also lots of  belching.  Did admit to occasional Aleve use.  Taking meloxicam and diclofenac for pain.  Having bowel movement every 2-4 days and has to strain at times.  Feels as though she has hemorrhoids and rare toilet tissue hematochezia.  Takes Dulcolax to help with bowel movements.  Did admit to occasional alcohol use and marijuana use however denied any tobacco use.  Does report occasional chest pain and no edema.  Scheduled for EGD with dilation.  Advised to stop Nexium and start pantoprazole twice daily and famotidine as needed for breakthrough.  Advised on GERD diet.  Provided samples of Linzess.  Advised Preparation H for hemorrhoids.  Begin fiber supplementation daily.  EGD 07/05/2022: -Normal esophagus s/p dilation -Small hiatal hernia -Normal duodenum  Today: GERD/Dysphagia -   Constipation -   Anemia -   Current Outpatient Medications  Medication Sig Dispense Refill   potassium chloride SA (KLOR-CON M) 20 MEQ tablet Take 2 tablets (40 mEq total) by mouth daily. (Patient not taking: Reported on 07/05/2022) 6 tablet 0   albuterol (PROVENTIL HFA;VENTOLIN HFA) 108 (90 Base) MCG/ACT inhaler Inhale 1-2 puffs into the lungs every 6 (six) hours as needed for wheezing or shortness of breath.     amLODipine (NORVASC) 5 MG tablet Take 1 tablet (5 mg total) by mouth daily. (Patient taking differently: Take 10 mg by mouth daily.) 30 tablet 0   ammonium lactate (AMLACTIN) 12 % lotion Apply 1 Application topically as needed for dry skin. 400 g 0  benzonatate (TESSALON) 100 MG capsule Take 1 capsule (100 mg total) by mouth every 8 (eight) hours. 21 capsule 0   budesonide-formoterol (SYMBICORT) 160-4.5 MCG/ACT inhaler Inhale 2 puffs into the lungs 2 (two) times daily as needed (Shortness of breath).     diclofenac (VOLTAREN) 75 MG EC tablet Take 1 tablet (75 mg total) by mouth 2 (two) times daily. (Patient not taking: Reported on 06/29/2022) 20 tablet 0   Guaifenesin 1200 MG TB12 Take 1 tablet (1,200 mg total)  by mouth 2 (two) times daily at 10 AM and 5 PM. 14 tablet 0   hydrochlorothiazide (HYDRODIURIL) 12.5 MG tablet Take 1 tablet (12.5 mg total) by mouth daily. 30 tablet 0   ibuprofen (ADVIL) 800 MG tablet Take 1 tablet (800 mg total) by mouth every 6 (six) hours as needed. 60 tablet 1   lurasidone (LATUDA) 20 MG TABS tablet TAKE ONE TABLET BY MOUTH EVERY EVENING (TAKE WITH EVENING MEAL)     meloxicam (MOBIC) 15 MG tablet Take 1 tablet (15 mg total) by mouth daily. 30 tablet 2   Multiple Vitamins-Minerals (MULTIVITAMIN WITH MINERALS) tablet Take 1 tablet by mouth daily.     oxyCODONE-acetaminophen (PERCOCET) 5-325 MG tablet Take 1 tablet by mouth every 4 (four) hours as needed for severe pain. 30 tablet 0   pantoprazole (PROTONIX) 40 MG tablet Take 1 tablet (40 mg total) by mouth 2 (two) times daily before a meal. 90 tablet 3   sodium chloride (OCEAN) 0.65 % SOLN nasal spray Place 1 spray into both nostrils as needed for congestion.     traZODone (DESYREL) 100 MG tablet TAKE 1 TO 2 TABLET(S) BY MOUTH EVERY NIGHT AT BEDTIME     venlafaxine XR (EFFEXOR-XR) 150 MG 24 hr capsule TAKE ONE CAPSULE BY MOUTH EVERY MORNING FOR MOOD     No current facility-administered medications for this visit.    Past Medical History:  Diagnosis Date   Anxiety    Arthritis    possibly her right shoulder.   Chronic back pain    COPD (chronic obstructive pulmonary disease) (HCC)    Fibromyalgia    GERD (gastroesophageal reflux disease)    Hypertension    Sleep apnea    could not tolerate    Past Surgical History:  Procedure Laterality Date   ABDOMINAL HYSTERECTOMY     CARPAL TUNNEL RELEASE Left    CHOLECYSTECTOMY N/A 02/25/2021   Procedure: LAPAROSCOPIC CHOLECYSTECTOMY;  Surgeon: Franky Macho, MD;  Location: [facility];  Service: General;  Laterality: N/A;   COLONOSCOPY WITH PROPOFOL N/A 08/23/2017   Dr. Jena Gauss, grade II hemorrhoids. next TCS in 10 years.    ESOPHAGOGASTRODUODENOSCOPY (EGD) WITH PROPOFOL N/A  08/23/2017   Dr. Jena Gauss: mild erosive reflux esophagitis s/p dilation due to h/o dysphagia.    ESOPHAGOGASTRODUODENOSCOPY (EGD) WITH PROPOFOL N/A 07/05/2022   Procedure: ESOPHAGOGASTRODUODENOSCOPY (EGD) WITH PROPOFOL;  Surgeon: Corbin Ade, MD;  [facility];  Service: Endoscopy;  Laterality: N/A;  7:30 am   FOOT SURGERY Right    screws from fracture   GANGLION CYST EXCISION Right    foot   KNEE SURGERY Right    arthroscopy   MALONEY DILATION N/A 08/23/2017   Procedure: Vicki Dean DILATION;  Surgeon: Corbin Ade, MD;  [facility];  Service: Endoscopy;  Laterality: N/A;   MALONEY DILATION N/A 07/05/2022   Procedure: Vicki Dean DILATION;  Surgeon: Corbin Ade, MD;  [facility];  Service: Endoscopy;  Laterality: N/A;  Family History  Problem Relation Age of Onset   Hypertension Mother    Diabetes Mother    Arthritis Mother    Hypercholesterolemia Mother    Emphysema Father    Cancer Other    Diabetes Other    Colon cancer Neg Hx    Gastric cancer Neg Hx    Esophageal cancer Neg Hx     Allergies as of 10/30/2022 - Review Complete 10/10/2022  Allergen Reaction Noted   Doxepin  07/31/2017   Lisinopril  06/28/2017   Omeprazole  06/28/2017   Pneumococcal vaccines  07/31/2017    Social History   Socioeconomic History   Marital status: Single    Spouse name: Not on file   Number of children: Not on file   Years of education: Not on file   Highest education level: Not on file  Occupational History   Not on file  Tobacco Use   Smoking status: Former    Packs/day: 1.00    Years: 20.00    Additional pack years: 0.00    Total pack years: 20.00    Types: Cigarettes    Quit date: 08/20/2013    Years since quitting: 9.2   Smokeless tobacco: Never  Vaping Use   Vaping Use: Never used  Substance and Sexual Activity   Alcohol use: Not Currently    Comment: in the past   Drug use: Not Currently    Types: Marijuana    Comment: denied  05/05/19   Sexual activity: Yes    Birth control/protection: Surgical  Other Topics Concern   Not on file  Social History Narrative   Not on file   Social Determinants of Health   Financial Resource Strain: Not on file  Food Insecurity: Not on file  Transportation Needs: Not on file  Physical Activity: Not on file  Stress: Not on file  Social Connections: Not on file     Review of Systems   Gen: Denies fever, chills, anorexia. Denies fatigue, weakness, weight loss.  CV: Denies chest pain, palpitations, syncope, peripheral edema, and claudication. Resp: Denies dyspnea at rest, cough, wheezing, coughing up blood, and pleurisy. GI: See HPI Derm: Denies rash, itching, dry skin Psych: Denies depression, anxiety, memory loss, confusion. No homicidal or suicidal ideation.  Heme: Denies bruising, bleeding, and enlarged lymph nodes.   Physical Exam   There were no vitals taken for this visit.  General:   Alert and oriented. No distress noted. Pleasant and cooperative.  Head:  Normocephalic and atraumatic. Eyes:  Conjuctiva clear without scleral icterus. Mouth:  Oral mucosa pink and moist. Good dentition. No lesions. Lungs:  Clear to auscultation bilaterally. No wheezes, rales, or rhonchi. No distress.  Heart:  S1, S2 present without murmurs appreciated.  Abdomen:  +BS, soft, non-tender and non-distended. No rebound or guarding. No HSM or masses noted. Rectal: *** Msk:  Symmetrical without gross deformities. Normal posture. Extremities:  Without edema. Neurologic:  Alert and  oriented x4 Psych:  Alert and cooperative. Normal mood and affect.   Assessment  TYMIKA HAGEL is a 62 y.o. female with a history of GERD, IDA, tobacco use, prior substance use disorder, arthritis, hypertension, anxiety, chronic back pain presenting today for follow up.   GERD:  Dysphagia:  Constipation:   Anemia: Colonoscopy in 2019 with hemorrhoids.  EGD in 2019 with mild erosive reflux  esophagitis, recent EGD in January with small hiatal hernia.  PLAN   *** Continue pantoprazole 40 mg twice daily. GERD diet Linzess?  Daily fiber supplement    Brooke Bonito, MSN, FNP-BC, AGACNP-BC [facility]

## 2022-11-16 ENCOUNTER — Encounter: Payer: Self-pay | Admitting: Gastroenterology

## 2022-11-16 ENCOUNTER — Ambulatory Visit: Payer: No Typology Code available for payment source | Admitting: Gastroenterology

## 2022-11-16 VITALS — BP 181/96 | HR 66 | Temp 97.9°F | Ht 67.0 in | Wt 177.8 lb

## 2022-11-16 DIAGNOSIS — K2100 Gastro-esophageal reflux disease with esophagitis, without bleeding: Secondary | ICD-10-CM | POA: Diagnosis not present

## 2022-11-16 DIAGNOSIS — K5904 Chronic idiopathic constipation: Secondary | ICD-10-CM

## 2022-11-16 DIAGNOSIS — R1310 Dysphagia, unspecified: Secondary | ICD-10-CM | POA: Diagnosis not present

## 2022-11-16 DIAGNOSIS — R1084 Generalized abdominal pain: Secondary | ICD-10-CM

## 2022-11-16 DIAGNOSIS — D649 Anemia, unspecified: Secondary | ICD-10-CM

## 2022-11-16 MED ORDER — LINACLOTIDE 145 MCG PO CAPS: 145.0000 ug | ORAL_CAPSULE | Freq: Every day | ORAL | 2 refills | Status: DC

## 2022-11-16 NOTE — Progress Notes (Signed)
GI Office Note    Referring Provider: [facility] Physician:  [facility] Gastroenterologist: Gerrit Friends.Rourk, MD  Date:  11/16/2022  ID:  Vicki Dean, DOB 02-25-1961, MRN [id number]   Chief Complaint   Chief Complaint  Patient presents with   Follow-up    Chronic abdominal pain   History of Present Illness  Vicki Dean is a 62 y.o. female with a history of GERD, IDA, tobacco use, prior substance use disorder, arthritis, hypertension, anxiety, chronic back pain presenting today for follow up.   EGD March 2019: -Moderate erosive reflux esophagitis s/p dilation   Colonoscopy March 2019: -Grade 2 hemorrhoids -Advised repeat in 10 years   Last [place] office visit patient requested GI consult and optometry consult.  Was having chest pain, shortness of breath, and hypertension.  She is known to be noncompliant with all medications for the prior 6 months.  Also admitted to heavy cocaine use and EtOH use for the last 2 days.  She was noted to have rapid speech and excitation as well as excessive movement.  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  She was referred to the ED for evaluation of possible ACS in the setting of hypertensive emergency and drug use.  Patient declined EMS transport, was to go to the ED with her daughter.  GI referral given for GERD, noted to have history of esophageal stricture.   Most recent labs at the [place] with normal LFTs, hematocrit 37.7, A1c 5.6, bilirubin 0.4, vitamin D 17.5   Most recent labs in [facility] system, 01/18/2022: Hemoglobin 12, hematocrit 37.6, MCV 69.8.   Last office visit 05/29/2022.  Patient reported taking Nexium once daily but reflux getting worse.  Sleeps with a wedge underneath her mattress.  Did report fatigue and upper abdominal pain.  At times is trying on her saliva and feels acid coming out when laying flat.  Triggers are greasy foods, chocolate, peppermint,  and soda.  Pills getting hung at the base of her neck also lots of belching.  Did admit to occasional Aleve use.  Taking meloxicam and diclofenac for pain.  Having bowel movement every 2-4 days and has to strain at times.  Feels as though she has hemorrhoids and rare toilet tissue hematochezia.  Takes Dulcolax to help with bowel movements.  Did admit to occasional alcohol use and marijuana use however denied any tobacco use.  Does report occasional chest pain and no edema.  Scheduled for EGD with dilation.  Advised to stop Nexium and start pantoprazole twice daily and famotidine as needed for breakthrough.  Advised on GERD diet.  Provided samples of Linzess.  Advised Preparation H for hemorrhoids.  Begin fiber supplementation daily.   EGD 07/05/2022: -Normal esophagus s/p dilation -Small hiatal hernia -Normal duodenum  Labs 08/22/22: Hgb 13.3, MCV 68.8, sodium 142, normal LFTs.  Today: GERD/Dysphagia - Taking pantoprazole at least once a day. Sometimes forgets to take it twice day. Some nausea but no vomiting. Mostly when she is nauseas she will have some dry heaving. Very little alcohol use and denies smoking.  No dysphagia.   Constipation - Not as bad as before but sometimes yes. Not having a BM often. Linzess was sort of helpful. Sometimes has some mild toilet tissue hematochezia when she has to strain. Does have hemorrhoids. Does have some bloating.  Has not tolerated fiber or MiraLAX in the past.  Anemia - Fatigue is present. No  significant lightheadedness or dizziness. No PICA.   Abdominal pain - having pain throughout her abdomen. Unable to wear tight clothes. At times has her doubled over. Tries to not eat a lot of spicy foods. Peppermint candies bother her a lot.   States she used to live in [place] and considering moving back to [place] and talking with social worker to help find her a place given issues with her family. Given her mobility she has difficulty with her living situation. Also  family issues.   Current Outpatient Medications  Medication Sig Dispense Refill   albuterol (PROVENTIL HFA;VENTOLIN HFA) 108 (90 Base) MCG/ACT inhaler Inhale 1-2 puffs into the lungs every 6 (six) hours as needed for wheezing or shortness of breath.     amLODipine (NORVASC) 10 MG tablet 1 tab(s) orally once a day for 30 day(s)     ammonium lactate (AMLACTIN) 12 % lotion Apply 1 Application topically as needed for dry skin. 400 g 0   benzonatate (TESSALON) 100 MG capsule Take 1 capsule (100 mg total) by mouth every 8 (eight) hours. 21 capsule 0   budesonide-formoterol (SYMBICORT) 160-4.5 MCG/ACT inhaler Inhale 2 puffs into the lungs 2 (two) times daily as needed (Shortness of breath).     diclofenac (VOLTAREN) 75 MG EC tablet Take 1 tablet (75 mg total) by mouth 2 (two) times daily. 20 tablet 0   Guaifenesin 1200 MG TB12 Take 1 tablet (1,200 mg total) by mouth 2 (two) times daily at 10 AM and 5 PM. 14 tablet 0   hydrochlorothiazide (HYDRODIURIL) 12.5 MG tablet Take 1 tablet (12.5 mg total) by mouth daily. 30 tablet 0   ibuprofen (ADVIL) 800 MG tablet Take 1 tablet (800 mg total) by mouth every 6 (six) hours as needed. 60 tablet 1   lurasidone (LATUDA) 20 MG TABS tablet TAKE ONE TABLET BY MOUTH EVERY EVENING (TAKE WITH EVENING MEAL)     meloxicam (MOBIC) 15 MG tablet Take 1 tablet (15 mg total) by mouth daily. 30 tablet 2   Multiple Vitamins-Minerals (MULTIVITAMIN WITH MINERALS) tablet Take 1 tablet by mouth daily.     oxyCODONE-acetaminophen (PERCOCET) 5-325 MG tablet Take 1 tablet by mouth every 4 (four) hours as needed for severe pain. 30 tablet 0   pantoprazole (PROTONIX) 40 MG tablet Take 1 tablet (40 mg total) by mouth 2 (two) times daily before a meal. 90 tablet 3   potassium chloride SA (KLOR-CON M) 20 MEQ tablet Take 2 tablets (40 mEq total) by mouth daily. 6 tablet 0   sodium chloride (OCEAN) 0.65 % SOLN nasal spray Place 1 spray into both nostrils as needed for congestion.     traZODone  (DESYREL) 100 MG tablet TAKE 1 TO 2 TABLET(S) BY MOUTH EVERY NIGHT AT BEDTIME     venlafaxine XR (EFFEXOR-XR) 150 MG 24 hr capsule TAKE ONE CAPSULE BY MOUTH EVERY MORNING FOR MOOD     No current facility-administered medications for this visit.    Past Medical History:  Diagnosis Date   Anxiety    Arthritis    possibly her right shoulder.   Chronic back pain    COPD (chronic obstructive pulmonary disease) (HCC)    Fibromyalgia    GERD (gastroesophageal reflux disease)    Hypertension    Sleep apnea    could not tolerate    Past Surgical History:  Procedure Laterality Date   ABDOMINAL HYSTERECTOMY     CARPAL TUNNEL RELEASE Left    CHOLECYSTECTOMY N/A 02/25/2021   Procedure: LAPAROSCOPIC  CHOLECYSTECTOMY;  Surgeon: Franky Macho, MD;  Location: [facility];  Service: General;  Laterality: N/A;   COLONOSCOPY WITH PROPOFOL N/A 08/23/2017   Dr. Jena Gauss, grade II hemorrhoids. next TCS in 10 years.    ESOPHAGOGASTRODUODENOSCOPY (EGD) WITH PROPOFOL N/A 08/23/2017   Dr. Jena Gauss: mild erosive reflux esophagitis s/p dilation due to h/o dysphagia.    ESOPHAGOGASTRODUODENOSCOPY (EGD) WITH PROPOFOL N/A 07/05/2022   Procedure: ESOPHAGOGASTRODUODENOSCOPY (EGD) WITH PROPOFOL;  Surgeon: Corbin Ade, MD;  [facility];  Service: Endoscopy;  Laterality: N/A;  7:30 am   FOOT SURGERY Right    screws from fracture   GANGLION CYST EXCISION Right    foot   KNEE SURGERY Right    arthroscopy   MALONEY DILATION N/A 08/23/2017   Procedure: Elease Hashimoto DILATION;  Surgeon: Corbin Ade, MD;  [facility];  Service: Endoscopy;  Laterality: N/A;   MALONEY DILATION N/A 07/05/2022   Procedure: Elease Hashimoto DILATION;  Surgeon: Corbin Ade, MD;  [facility];  Service: Endoscopy;  Laterality: N/A;    Family History  Problem Relation Age of Onset   Hypertension Mother    Diabetes Mother    Arthritis Mother    Hypercholesterolemia Mother    Emphysema Father    Cancer Other    Diabetes  Other    Colon cancer Neg Hx    Gastric cancer Neg Hx    Esophageal cancer Neg Hx     Allergies as of 11/16/2022 - Review Complete 11/16/2022  Allergen Reaction Noted   Doxepin  07/31/2017   Lisinopril  06/28/2017   Omeprazole  06/28/2017   Pneumococcal vaccines  07/31/2017    Social History   Socioeconomic History   Marital status: Single    Spouse name: Not on file   Number of children: Not on file   Years of education: Not on file   Highest education level: Not on file  Occupational History   Not on file  Tobacco Use   Smoking status: Former    Packs/day: 1.00    Years: 20.00    Additional pack years: 0.00    Total pack years: 20.00    Types: Cigarettes    Quit date: 08/20/2013    Years since quitting: 9.2   Smokeless tobacco: Never  Vaping Use   Vaping Use: Never used  Substance and Sexual Activity   Alcohol use: Not Currently    Comment: in the past   Drug use: Not Currently    Types: Marijuana    Comment: denied 05/05/19   Sexual activity: Yes    Birth control/protection: Surgical  Other Topics Concern   Not on file  Social History Narrative   Not on file   Social Determinants of Health   Financial Resource Strain: Not on file  Food Insecurity: Not on file  Transportation Needs: Not on file  Physical Activity: Not on file  Stress: Not on file  Social Connections: Not on file   Review of Systems   Gen: + fatigue, + decreased mobility. Denies fever, chills, anorexia. Denies weakness, weight loss.  CV: Denies chest pain, palpitations, syncope, peripheral edema, and claudication. Resp: Denies dyspnea at rest, cough, wheezing, coughing up blood, and pleurisy. GI: See HPI Derm: Denies rash, itching, dry skin Psych: Denies depression, anxiety, memory loss, confusion. No homicidal or suicidal ideation.  Heme: Denies bruising, bleeding, and enlarged lymph nodes.  Physical Exam   BP (!) 173/88 (BP Location: Right Arm, Patient Position: Sitting, Cuff  Size: Large)   Pulse (!) 59   Temp 97.9 F (36.6 C) (Oral)   Ht 5\' 7"  (1.702 m)   Wt 177 lb 12.8 oz (80.6 kg)   SpO2 97%   BMI 27.85 kg/m   General:   Alert and oriented. No distress noted. Pleasant and cooperative.  Head:  Normocephalic and atraumatic. Eyes:  Conjuctiva clear without scleral icterus. Mouth:  Oral mucosa pink and moist. Good dentition. No lesions. Lungs:  Clear to auscultation bilaterally. No wheezes, rales, or rhonchi. No distress.  Heart:  S1, S2 present without murmurs appreciated.  Abdomen:  +BS, soft, non-distended.  TTP to LLQ and epigastrium.  Very mild tenderness to RLQ.  No rebound or guarding. No HSM or masses noted. Rectal: deferred Msk: Weak short gait.  Using single-point cane Extremities:  Without edema. Neurologic:  Alert and  oriented x4 Psych:  Alert and cooperative. Normal mood and affect.   Assessment  NATILEY RENE is a 62 y.o. female with a history of GERD, IDA, tobacco use, prior substance use disorder, arthritis, hypertension, anxiety, chronic back pain presenting today for follow up.    GERD, Dysphagia: Continues to take NSAIDs as needed, has diclofenac on her med list but does not take this on a regular basis.  GERD symptoms are exacerbated by permanents especially.  Continues to eat some trigger foods such as sodas.  Does try to stay away from spicy foods.  Dysphagia resolved post dilation.  Used to have some epigastric discomfort although no evidence of gastritis on EGD.  Some of her mild epigastric tenderness could be secondary to constipation therefore we are treating as below.  She was encouraged to continue her PPI and that she should take this twice daily if she continues to have some mild burning and epigastric pain as she currently only takes this once a day consistently, sometimes twice daily.   Constipation: Chronic.  Not going very frequently, unable to give frequency.  Still also having issues with her hemorrhoids given mild toilet  tissue hematochezia.  She continues to have to strain.  Given Linzess samples at last visit which she states has been helpful when she takes it.  Uses Preparation H over-the-counter as needed for hemorrhoids.  We again today discussed adequate hydration as well as fiber intake and in order to help her abdominal pain she needs to consistently take Linzess to have better bowel movements as I suspect this is largely the culprit of her abdominal pain.   Anemia: Colonoscopy in 2019 with hemorrhoids.  EGD in 2019 with mild erosive reflux esophagitis, recent EGD in January with small hiatal hernia.  Recent hemoglobin in March stable.  If any significant decline in her hemoglobin or she develops any alarm symptoms we will likely proceed with early interval colonoscopy.   PLAN   Encouraged pantoprazole 40 mg twice daily. Famotidine 20 mg nightly as needed.  GERD diet Avoid NSAIDs Linzess 145 mcg daily, 30 minutes prior to breakfast. Will provide some additional samples today until prescription can be obtained. If insurance will not pay then will do another alternative.  Daily fiber supplement Follow up in 4 months.      Brooke Bonito, MSN, FNP-BC, AGACNP-BC [facility]

## 2022-11-16 NOTE — Patient Instructions (Signed)
I am providing her with some additional samples of Linzess today.  I am going to go ahead and send in a prescription for you.  It is imperative that you take this every single day in the morning on empty stomach.  How to take Linzess: Once a day every day on empty stomach, at least 30 minutes before your first meal of the day. It is best to keep medications at a stable temperature Medication is best kept in its original bottle with the disket present.  It is a medication that is meant for everyday use and not to be used as needed.    What to expect: Constipation relief is typically felt in about 1 week Relief of abdominal pain, discomfort, and bloating begins in about 1 week with symptoms typically improving over 12 weeks and beyond. Diarrhea is most common side effect and typically begins within the first 2 weeks and can take 3-4 weeks to resolve It would be helpful to begin treatment over the weekend or when you can be closer to a bathroom   You can go to [email or web address] for patient support and sign up for daily medication reminders.   He would likely benefit from a fiber supplement such as Benefiber.  I would recommend 3 teaspoons daily in 8 ounces of water.  Continue to take pantoprazole 40 mg twice daily.  In order to control your GERD symptoms is imperative that you take this twice daily.  It was a pleasure to see you today. I want to create trusting relationships with patients. If you receive a survey regarding your visit,  I greatly appreciate you taking time to fill this out on paper or through your MyChart. I value your feedback.  Brooke Bonito, MSN, FNP-BC, AGACNP-BC [facility]

## 2022-11-23 ENCOUNTER — Telehealth: Payer: Self-pay | Admitting: *Deleted

## 2022-11-23 NOTE — Telephone Encounter (Signed)
Received approval letter for Linzess . Sent copy to scan center.

## 2023-02-07 ENCOUNTER — Encounter (HOSPITAL_COMMUNITY): Payer: Self-pay | Admitting: Internal Medicine

## 2023-02-07 DIAGNOSIS — M25512 Pain in left shoulder: Secondary | ICD-10-CM

## 2023-02-08 ENCOUNTER — Other Ambulatory Visit (HOSPITAL_COMMUNITY): Payer: Self-pay | Admitting: Internal Medicine

## 2023-02-08 ENCOUNTER — Encounter: Payer: Self-pay | Admitting: Gastroenterology

## 2023-02-08 DIAGNOSIS — M25512 Pain in left shoulder: Secondary | ICD-10-CM

## 2023-02-22 ENCOUNTER — Ambulatory Visit (HOSPITAL_COMMUNITY)
Admission: RE | Admit: 2023-02-22 | Discharge: 2023-02-22 | Disposition: A | Discharge status: Home / Self Care | Payer: No Typology Code available for payment source | Source: Ambulatory Visit | Attending: Internal Medicine | Admitting: Internal Medicine

## 2023-02-22 DIAGNOSIS — M25512 Pain in left shoulder: Secondary | ICD-10-CM | POA: Diagnosis present

## 2023-03-29 ENCOUNTER — Ambulatory Visit: Payer: No Typology Code available for payment source | Admitting: Gastroenterology

## 2023-03-29 ENCOUNTER — Encounter: Payer: Self-pay | Admitting: Gastroenterology

## 2023-03-29 VITALS — BP 135/77 | HR 80 | Temp 97.7°F | Ht 67.0 in | Wt 171.4 lb

## 2023-03-29 DIAGNOSIS — K5904 Chronic idiopathic constipation: Secondary | ICD-10-CM

## 2023-03-29 DIAGNOSIS — K2100 Gastro-esophageal reflux disease with esophagitis, without bleeding: Secondary | ICD-10-CM

## 2023-03-29 DIAGNOSIS — K219 Gastro-esophageal reflux disease without esophagitis: Secondary | ICD-10-CM | POA: Diagnosis not present

## 2023-03-29 DIAGNOSIS — F1090 Alcohol use, unspecified, uncomplicated: Secondary | ICD-10-CM | POA: Diagnosis not present

## 2023-03-29 DIAGNOSIS — K5900 Constipation, unspecified: Secondary | ICD-10-CM | POA: Diagnosis not present

## 2023-03-29 DIAGNOSIS — R1013 Epigastric pain: Secondary | ICD-10-CM

## 2023-03-29 MED ORDER — ESOMEPRAZOLE MAGNESIUM 40 MG PO CPDR: 40.0000 mg | DELAYED_RELEASE_CAPSULE | Freq: Two times a day (BID) | ORAL | 5 refills | Status: AC

## 2023-03-29 MED ORDER — LINACLOTIDE 290 MCG PO CAPS: 290.0000 ug | ORAL_CAPSULE | Freq: Every day | ORAL | 5 refills | Status: AC

## 2023-03-29 MED ORDER — SUCRALFATE 1 GM/10ML PO SUSP: 1.0000 g | Freq: Three times a day (TID) | ORAL | 0 refills | Status: AC

## 2023-03-29 NOTE — Patient Instructions (Signed)
Follow a GERD diet:  Avoid fried, fatty, greasy, spicy, citrus foods. Avoid caffeine and carbonated beverages. Avoid chocolate. Try eating 4-6 small meals a day rather than 3 large meals. Do not eat within 3 hours of laying down. Prop head of bed up on wood or bricks to create a 6 inch incline.   I have sent in multiple medications to the pharmacy for you today.  Stop taking pantoprazole and start taking Nexium 40 mg twice daily Stop taking Linzess 145 mcg.  Please start taking Linzess 290 mcg once daily I sent in Carafate for you to take 1 g 4 times a day for 2 weeks.  You will take this 1 before each meal and then 1 before bed.  This is to help coat your stomach.  [facility] [place] [facility]  We will plan to follow-up in 3 months, sooner if needed.  It was a pleasure to see you today. I want to create trusting relationships with patients. If you receive a survey regarding your visit,  I greatly appreciate you taking time to fill this out on paper or through your MyChart. I value your feedback.  Brooke Bonito, MSN, FNP-BC, AGACNP-BC [facility]

## 2023-03-29 NOTE — Progress Notes (Signed)
GI Office Note    Referring Provider: Atha Starks, MD Primary Care Physician:  Atha Starks, MD Primary Gastroenterologist: Gerrit Friends.Rourk, MD  Date:  03/29/2023  ID:  DAYANNE YIU, DOB 26-Apr-1961, MRN [id number]   Chief Complaint   Chief Complaint  Patient presents with   Follow-up    Follow up. Still having problems with stomach burning.    History of Present Illness  NEOLA WORRALL is a 62 y.o. female with a history of GERD, IDA, tobacco abuse, prior substance use disorder, HTN, arthritis, anxiety, chronic back pain presenting today with complaint of stomach burning.  EGD March 2019: -Moderate erosive reflux esophagitis s/p dilation   Colonoscopy March 2019: -Grade 2 hemorrhoids -Advised repeat in 10 years   Last [place] office visit patient requested GI consult and optometry consult.  Was having chest pain, shortness of breath, and hypertension.  She is known to be noncompliant with all medications for the prior 6 months.  Also admitted to heavy cocaine use and EtOH use for the last 2 days.  She was noted to have rapid speech and excitation as well as excessive movement.  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  She was referred to the ED for evaluation of possible ACS in the setting of hypertensive emergency and drug use.  Patient declined EMS transport, was to go to the ED with her daughter.  GI referral given for GERD, noted to have history of esophageal stricture.   Most recent labs at the [place] with normal LFTs, hematocrit 37.7, A1c 5.6, bilirubin 0.4, vitamin D 17.5   Most recent labs in [facility] system, 01/18/2022: Hemoglobin 12, hematocrit 37.6, MCV 69.8.   Last office visit 05/29/2022.  Patient reported taking Nexium once daily but reflux getting worse.  Sleeps with a wedge underneath her mattress.  Did report fatigue and upper abdominal pain.  At times is trying on her saliva and feels acid coming out when laying flat.  Triggers are  greasy foods, chocolate, peppermint, and soda.  Pills getting hung at the base of her neck also lots of belching.  Did admit to occasional Aleve use.  Taking meloxicam and diclofenac for pain.  Having bowel movement every 2-4 days and has to strain at times.  Feels as though she has hemorrhoids and rare toilet tissue hematochezia.  Takes Dulcolax to help with bowel movements.  Did admit to occasional alcohol use and marijuana use however denied any tobacco use.  Does report occasional chest pain and no edema.  Scheduled for EGD with dilation.  Advised to stop Nexium and start pantoprazole twice daily and famotidine as needed for breakthrough.  Advised on GERD diet.  Provided samples of Linzess.  Advised Preparation H for hemorrhoids.  Begin fiber supplementation daily.   EGD 07/05/2022: -Normal esophagus s/p dilation -Small hiatal hernia -Normal duodenum   Labs 08/22/22: Hgb 13.3, MCV 68.8, sodium 142, normal LFTs.  Last office visit 11/16/22. Still fatigue but denies any dizziness or lightheadedness.  No pica.  Taking PPI usually at least once a day, forgets to take it twice a day usually.  Sometimes goes nausea and has dry heaving.  Very little alcohol use.  Still not having bowel movements very often, has had some benefit from Linzess in the past.  Mild toilet tissue medic easy with straining.  Notices peppermint candies are a bother to her abdominal pain.  Encouraged to take PPI twice daily, famotidine nightly.  Take Linzess  45 mcg daily.  Daily fiber supplementation and avoid NSAIDs.  Today: Has been having issues with stomach burning. Happens often but not constant. 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  Has been taking pantoprazole 40 mg BID and has been on this for a long time. Not taking NSAIDs or ASA powders. Intermittent alcohol use (avoids brown liquor). Pain is in the epigastric region. Mild burning mid chest. Not worse at night - is all the time. Triggers - dark soda, spicy, fried foods,  peppermint candy.   Intermittent toilet tissue hematochezia with straining. Still taking Linzess daily and its doing okay. Its better than it was. Sometimes still needing to strain.   On October 28th the [place] in [place] she is going to have another EGD.    Current Outpatient Medications  Medication Sig Dispense Refill   albuterol (PROVENTIL HFA;VENTOLIN HFA) 108 (90 Base) MCG/ACT inhaler Inhale 1-2 puffs into the lungs every 6 (six) hours as needed for wheezing or shortness of breath.     amLODipine (NORVASC) 10 MG tablet 1 tab(s) orally once a day for 30 day(s)     budesonide-formoterol (SYMBICORT) 160-4.5 MCG/ACT inhaler Inhale 2 puffs into the lungs 2 (two) times daily as needed (Shortness of breath).     diclofenac (VOLTAREN) 75 MG EC tablet Take 1 tablet (75 mg total) by mouth 2 (two) times daily. 20 tablet 0   esomeprazole (NEXIUM) 40 MG capsule Take 1 capsule (40 mg total) by mouth 2 (two) times daily before a meal. 60 capsule 5   hydrochlorothiazide (HYDRODIURIL) 12.5 MG tablet Take 1 tablet (12.5 mg total) by mouth daily. 30 tablet 0   linaclotide (LINZESS) 290 MCG CAPS capsule Take 1 capsule (290 mcg total) by mouth daily before breakfast. 30 capsule 5   lurasidone (LATUDA) 20 MG TABS tablet TAKE ONE TABLET BY MOUTH EVERY EVENING (TAKE WITH EVENING MEAL)     meloxicam (MOBIC) 15 MG tablet Take 1 tablet by mouth daily.     Multiple Vitamins-Minerals (MULTIVITAMIN WITH MINERALS) tablet Take 1 tablet by mouth daily.     potassium chloride SA (KLOR-CON M) 20 MEQ tablet Take 2 tablets (40 mEq total) by mouth daily. 6 tablet 0   sodium chloride (OCEAN) 0.65 % SOLN nasal spray Place 1 spray into both nostrils as needed for congestion.     sucralfate (CARAFATE) 1 GM/10ML suspension Take 10 mLs (1 g total) by mouth 4 (four) times daily -  with meals and at bedtime for 14 days. 560 mL 0   traZODone (DESYREL) 100 MG tablet TAKE 1 TO 2 TABLET(S) BY MOUTH EVERY NIGHT AT BEDTIME     venlafaxine XR  (EFFEXOR-XR) 150 MG 24 hr capsule TAKE ONE CAPSULE BY MOUTH EVERY MORNING FOR MOOD     ammonium lactate (AMLACTIN) 12 % lotion Apply 1 Application topically as needed for dry skin. 400 g 0   benzonatate (TESSALON) 100 MG capsule Take 1 capsule (100 mg total) by mouth every 8 (eight) hours. 21 capsule 0   ibuprofen (ADVIL) 800 MG tablet Take 1 tablet (800 mg total) by mouth every 6 (six) hours as needed. 60 tablet 1   No current facility-administered medications for this visit.    Past Medical History:  Diagnosis Date   Anxiety    Arthritis    possibly her right shoulder.   Chronic back pain    COPD (chronic obstructive pulmonary disease) (HCC)    Fibromyalgia    GERD (gastroesophageal reflux disease)  Hypertension    Sleep apnea    could not tolerate    Past Surgical History:  Procedure Laterality Date   ABDOMINAL HYSTERECTOMY     CARPAL TUNNEL RELEASE Left    CHOLECYSTECTOMY N/A 02/25/2021   Procedure: LAPAROSCOPIC CHOLECYSTECTOMY;  Surgeon: Franky Macho, MD;  Location: [facility];  Service: General;  Laterality: N/A;   COLONOSCOPY WITH PROPOFOL N/A 08/23/2017   Dr. Jena Gauss, grade II hemorrhoids. next TCS in 10 years.    ESOPHAGOGASTRODUODENOSCOPY (EGD) WITH PROPOFOL N/A 08/23/2017   Dr. Jena Gauss: mild erosive reflux esophagitis s/p dilation due to h/o dysphagia.    ESOPHAGOGASTRODUODENOSCOPY (EGD) WITH PROPOFOL N/A 07/05/2022   Procedure: ESOPHAGOGASTRODUODENOSCOPY (EGD) WITH PROPOFOL;  Surgeon: Corbin Ade, MD;  Location: [facility];  Service: Endoscopy;  Laterality: N/A;  7:30 am   FOOT SURGERY Right    screws from fracture   GANGLION CYST EXCISION Right    foot   KNEE SURGERY Right    arthroscopy   MALONEY DILATION N/A 08/23/2017   Procedure: Elease Hashimoto DILATION;  Surgeon: Corbin Ade, MD;  Location: [facility];  Service: Endoscopy;  Laterality: N/A;   MALONEY DILATION N/A 07/05/2022   Procedure: Elease Hashimoto DILATION;  Surgeon: Corbin Ade, MD;  Location: [facility];   Service: Endoscopy;  Laterality: N/A;    Family History  Problem Relation Age of Onset   Hypertension Mother    Diabetes Mother    Arthritis Mother    Hypercholesterolemia Mother    Emphysema Father    Cancer Other    Diabetes Other    Colon cancer Neg Hx    Gastric cancer Neg Hx    Esophageal cancer Neg Hx     Allergies as of 03/29/2023 - Review Complete 03/29/2023  Allergen Reaction Noted   Doxepin  07/31/2017   Lisinopril  06/28/2017   Omeprazole  06/28/2017   Pneumococcal vaccines  07/31/2017    Social History   Socioeconomic History   Marital status: Single    Spouse name: Not on file   Number of children: Not on file   Years of education: Not on file   Highest education level: Not on file  Occupational History   Not on file  Tobacco Use   Smoking status: Former    Current packs/day: 0.00    Average packs/day: 1 pack/day for 20.0 years (20.0 ttl pk-yrs)    Types: Cigarettes    Start date: 08/20/1993    Quit date: 08/20/2013    Years since quitting: 9.6   Smokeless tobacco: Never  Vaping Use   Vaping status: Never Used  Substance and Sexual Activity   Alcohol use: Not Currently    Comment: in the past   Drug use: Not Currently    Types: Marijuana    Comment: denied 05/05/19   Sexual activity: Yes    Birth control/protection: Surgical  Other Topics Concern   Not on file  Social History Narrative   Not on file   Social Determinants of Health   Financial Resource Strain: Not on file  Food Insecurity: Not on file  Transportation Needs: Not on file  Physical Activity: Not on file  Stress: Not on file  Social Connections: Not on file     Review of Systems   Gen: Denies fever, chills, anorexia. Denies fatigue, weakness, weight loss.  CV: Denies chest pain, palpitations, syncope, peripheral edema, and claudication. Resp: Denies dyspnea at rest, cough, wheezing, coughing up blood, and pleurisy. GI: See HPI Derm:  Denies rash, itching, dry skin Psych:  Denies depression, anxiety, memory loss, confusion. No homicidal or suicidal ideation.  Heme: Denies bruising, bleeding, and enlarged lymph nodes.   Physical Exam   BP 135/77 (BP Location: Right Arm, Patient Position: Sitting, Cuff Size: Normal)   Pulse 80   Temp 97.7 F (36.5 C) (Temporal)   Ht 5\' 7"  (1.702 m)   Wt 171 lb 6.4 oz (77.7 kg)   BMI 26.85 kg/m   General:   Alert and oriented. No distress noted. Pleasant and cooperative.  Head:  Normocephalic and atraumatic. Eyes:  Conjuctiva clear without scleral icterus. Mouth:  Oral mucosa pink and moist. Good dentition. No lesions. Abdomen:  +BS, soft, non-distended. Ttp to epigastrium and periumbilical region. No rebound or guarding. No HSM or masses noted. Rectal: deferred Msk:  Symmetrical without gross deformities. Normal posture. Extremities:  Without edema. Neurologic:  Alert and  oriented x4 Psych:  Alert and cooperative. Normal mood and affect.  Assessment  LOYD SALVADOR is a 62 y.o. female with a history of GERD, IDA, tobacco abuse, prior substance use disorder, HTN, arthritis, anxiety, chronic back pain presenting today with complaint of stomach burning.  Constipation: Fairly well-controlled, some room for improvement.  Still having intermittent toilet tissue medic easier related to straining.  Will increase Linzess from 145 mcg daily to 290 mcg daily.  GERD, epigastric pain: Symptoms not well-controlled with pantoprazole 40 mg twice daily.  Typical triggers include fried/spicy foods and peppermints.  Also sometimes intermittent symptoms with dark soda.  Continues to have some dietary indiscretion.  Having consistent burning in the epigastric region with pain.  Denies NSAID use but does admit to occasional alcohol use.  She reports [facility]  For now we will change PPI from pantoprazole to Nexium, has failed omeprazole in the past.  Will do a  2-week course of Carafate for symptom relief.  Advised her to have [facility] [place] [facility].  PLAN   Increase Linzess to 290 mcg daily.  Carafate 1g QID for 2 weeks Stop pantoprazole and start Nexium 40 mg BID GERD diet Advised patient to notify [facility] to [facility] [place] [facility]. Follow up in 3 months     Brooke Bonito, MSN, FNP-BC, AGACNP-BC [facility]

## 2023-05-16 ENCOUNTER — Encounter: Payer: Self-pay | Admitting: Internal Medicine

## 2023-05-17 ENCOUNTER — Encounter (HOSPITAL_COMMUNITY): Payer: Self-pay | Admitting: *Deleted

## 2023-05-17 ENCOUNTER — Emergency Department (HOSPITAL_COMMUNITY): Payer: Non-veteran care

## 2023-05-17 ENCOUNTER — Other Ambulatory Visit: Payer: Self-pay

## 2023-05-17 ENCOUNTER — Emergency Department (HOSPITAL_COMMUNITY)
Admission: EM | Admit: 2023-05-17 | Discharge: 2023-05-17 | Disposition: A | Discharge status: Home / Self Care | Payer: Non-veteran care | Attending: Emergency Medicine | Admitting: Emergency Medicine

## 2023-05-17 DIAGNOSIS — S3992XA Unspecified injury of lower back, initial encounter: Secondary | ICD-10-CM | POA: Diagnosis present

## 2023-05-17 DIAGNOSIS — S339XXA Sprain of unspecified parts of lumbar spine and pelvis, initial encounter: Secondary | ICD-10-CM | POA: Diagnosis not present

## 2023-05-17 DIAGNOSIS — J449 Chronic obstructive pulmonary disease, unspecified: Secondary | ICD-10-CM | POA: Diagnosis not present

## 2023-05-17 DIAGNOSIS — W19XXXA Unspecified fall, initial encounter: Secondary | ICD-10-CM | POA: Diagnosis not present

## 2023-05-17 DIAGNOSIS — S93401A Sprain of unspecified ligament of right ankle, initial encounter: Secondary | ICD-10-CM | POA: Diagnosis not present

## 2023-05-17 DIAGNOSIS — Z79899 Other long term (current) drug therapy: Secondary | ICD-10-CM | POA: Insufficient documentation

## 2023-05-17 DIAGNOSIS — Y92481 Parking lot as the place of occurrence of the external cause: Secondary | ICD-10-CM | POA: Insufficient documentation

## 2023-05-17 DIAGNOSIS — I10 Essential (primary) hypertension: Secondary | ICD-10-CM | POA: Diagnosis not present

## 2023-05-17 DIAGNOSIS — M25522 Pain in left elbow: Secondary | ICD-10-CM | POA: Insufficient documentation

## 2023-05-17 DIAGNOSIS — M25512 Pain in left shoulder: Secondary | ICD-10-CM | POA: Insufficient documentation

## 2023-05-17 DIAGNOSIS — S335XXA Sprain of ligaments of lumbar spine, initial encounter: Secondary | ICD-10-CM

## 2023-05-17 DIAGNOSIS — M545 Low back pain, unspecified: Secondary | ICD-10-CM

## 2023-05-17 MED ORDER — OXYCODONE HCL 5 MG PO TABS
5.0000 mg | ORAL_TABLET | Freq: Once | ORAL | Status: AC
  Administered 2023-05-17: 5 mg via ORAL
  Filled 2023-05-17: qty 1

## 2023-05-17 NOTE — ED Notes (Signed)
Had pt put on a gown and noticed her left should seems swollen and says causes her pain.

## 2023-05-17 NOTE — Discharge Instructions (Signed)
Continue to take your meloxicam.  CAM Walker provided.  Make an appointment with Dr. Mort Sawyers office to follow-up for the right ankle sprain.  Rest of workup including CT of lumbar back x-rays of left shoulder and left elbow and right ankle without any fractures.  Clinically have a right ankle sprain.  Use the cam walker at all times.

## 2023-05-17 NOTE — ED Triage Notes (Signed)
Pt states she was walking to her mailbox when she tripped and fell landing on her left elbow and right ankle  Pt c/o pain to all of back and has swelling to her right ankle  Pt went to [place] and they told her to come to ED for xrays

## 2023-05-17 NOTE — ED Notes (Signed)
Removed patient's shoes to examine foot and there is some swelling on her right ankle area. Patient has pain on her left shoulder and arm.

## 2023-05-17 NOTE — ED Provider Notes (Addendum)
[place] EMERGENCY DEPARTMENT AT [facility] Provider Note   CSN: [id number] Arrival date & time: 05/17/23  0847     History  Chief Complaint  Patient presents with   Vicki Dean is a 62 y.o. female.  Patient with a fall yesterday in the parking lot when she was going to her mailbox.  She fell backwards.  Landed on her butt.  Complained initially of lumbar back pain pain to the left shoulder left elbow and right ankle.  Patient did not hit her head.  Patient states that the lumbar back pain now kind of radiates up all the way to the neck.  But initially just heard in the low back.  Patient is followed at the [place] and in [place].  Patient's from [place].  Patient also denies any loss of consciousness.  Patient is on meloxicam for chronic back pain.  Patient states there is some tingling and numbness to the top of the right foot and the bottom of the right foot but not the leg.  No incontinence.  No neurological symptoms in the left leg.  Past medical history of hypertension reflux disease COPD chronic back pain fibromyalgia.  Past surgical history sniffing for abdominal hysterectomy.  Patient states she has had previous ankle surgery on the right and has screws in there.  But cannot tell that from her past surgical history.  Patient is a former smoker quit in 2015.       Home Medications Prior to Admission medications   Medication Sig Start Date End Date Taking? Authorizing Provider  albuterol (PROVENTIL HFA;VENTOLIN HFA) 108 (90 Base) MCG/ACT inhaler Inhale 1-2 puffs into the lungs every 6 (six) hours as needed for wheezing or shortness of breath.    [provider]  amLODipine (NORVASC) 10 MG tablet 1 tab(s) orally once a day for 30 day(s)    [provider]  ammonium lactate (AMLACTIN) 12 % lotion Apply 1 Application topically as needed for dry skin. 08/02/22   Candelaria Stagers, DPM  benzonatate (TESSALON) 100 MG capsule Take 1 capsule (100 mg  total) by mouth every 8 (eight) hours. 08/22/22   Linwood Dibbles, MD  budesonide-formoterol [facility]) 160-4.5 MCG/ACT inhaler Inhale 2 puffs into the lungs 2 (two) times daily as needed (Shortness of breath).    [provider]  diclofenac (VOLTAREN) 75 MG EC tablet Take 1 tablet (75 mg total) by mouth 2 (two) times daily. 11/27/21   Elson Areas, PA-C  esomeprazole (NEXIUM) 40 MG capsule Take 1 capsule (40 mg total) by mouth 2 (two) times daily before a meal. 03/29/23   Mahon, Frederik Schmidt, NP  hydrochlorothiazide (HYDRODIURIL) 12.5 MG tablet Take 1 tablet (12.5 mg total) by mouth daily. 01/18/22 03/29/23  Glyn Ade, MD  ibuprofen (ADVIL) 800 MG tablet Take 1 tablet (800 mg total) by mouth every 6 (six) hours as needed. 08/07/22   Candelaria Stagers, DPM  linaclotide Karlene Einstein) 290 MCG CAPS capsule Take 1 capsule (290 mcg total) by mouth daily before breakfast. 03/29/23   Aida Raider, NP  lurasidone (LATUDA) 20 MG TABS tablet TAKE ONE TABLET BY MOUTH EVERY EVENING (TAKE WITH EVENING MEAL) 04/21/22   [provider]  meloxicam (MOBIC) 15 MG tablet Take 1 tablet by mouth daily. 02/13/23   [provider]  Multiple Vitamins-Minerals (MULTIVITAMIN WITH MINERALS) tablet Take 1 tablet by mouth daily.    [provider]  potassium chloride SA (KLOR-CON M) 20 MEQ  tablet Take 2 tablets (40 mEq total) by mouth daily. 07/04/22   Rourk, Gerrit Friends, MD  sodium chloride (OCEAN) 0.65 % SOLN nasal spray Place 1 spray into both nostrils as needed for congestion.    [provider]  sucralfate (CARAFATE) 1 GM/10ML suspension Take 10 mLs (1 g total) by mouth 4 (four) times daily -  with meals and at bedtime for 14 days. 03/29/23 04/12/23  Aida Raider, NP  traZODone (DESYREL) 100 MG tablet TAKE 1 TO 2 TABLET(S) BY MOUTH EVERY NIGHT AT BEDTIME 04/26/22   [provider]  venlafaxine XR (EFFEXOR-XR) 150 MG 24 hr capsule TAKE ONE CAPSULE BY MOUTH EVERY MORNING FOR MOOD  04/21/22   [provider]      Allergies    Doxepin, Lisinopril, Omeprazole, and Pneumococcal vaccines    Review of Systems   Review of Systems  Constitutional:  Negative for chills and fever.  HENT:  Negative for ear pain and sore throat.   Eyes:  Negative for pain and visual disturbance.  Respiratory:  Negative for cough and shortness of breath.   Cardiovascular:  Negative for chest pain and palpitations.  Gastrointestinal:  Negative for abdominal pain and vomiting.  Genitourinary:  Negative for dysuria and hematuria.  Musculoskeletal:  Positive for back pain. Negative for arthralgias.  Skin:  Negative for color change and rash.  Neurological:  Positive for numbness. Negative for seizures and syncope.  All other systems reviewed and are negative.   Physical Exam Updated Vital Signs BP (!) 177/132 (BP Location: Right Arm)   Pulse (!) 59   Temp 98.2 F (36.8 C) (Oral)   Resp 17   Ht 1.702 m (5\' 7" )   Wt 84.8 kg   SpO2 97%   BMI 29.29 kg/m  Physical Exam Vitals and nursing note reviewed.  Constitutional:      General: She is not in acute distress.    Appearance: Normal appearance. She is well-developed. She is not ill-appearing.  HENT:     Head: Normocephalic and atraumatic.     Mouth/Throat:     Mouth: Mucous membranes are moist.  Eyes:     Extraocular Movements: Extraocular movements intact.     Conjunctiva/sclera: Conjunctivae normal.     Pupils: Pupils are equal, round, and reactive to light.  Cardiovascular:     Rate and Rhythm: Normal rate and regular rhythm.     Heart sounds: No murmur heard. Pulmonary:     Effort: Pulmonary effort is normal. No respiratory distress.     Breath sounds: Normal breath sounds.  Abdominal:     Palpations: Abdomen is soft.     Tenderness: There is no abdominal tenderness.  Musculoskeletal:        General: Tenderness present. No swelling.     Cervical back: Neck supple. No rigidity or tenderness.     Comments: Right  ankle with some lateral swelling.  Dorsalis pedis pulse is 2+.  Good movement of toes.  Patient states that it feels tingly on top of the foot and the bottom of the foot.  No proximal leg tenderness.  Also no knee swelling.  No pain at the hip.  Left lower extremity without evidence of any injury.  Right upper extremity without any evidence of any injury.  Left upper extremity with some pain at the elbow with a superficial abrasion without secondary infection and discomfort with movement at the shoulder.  No obvious deformity.  Radial pulses 2+.  Sensation intact in the  fingers.  Palpation to the back lower lumbar tenderness noted.  Skin:    General: Skin is warm and dry.     Capillary Refill: Capillary refill takes less than 2 seconds.  Neurological:     General: No focal deficit present.     Mental Status: She is alert and oriented to person, place, and time.     Cranial Nerves: No cranial nerve deficit.     Sensory: Sensory deficit present.     Motor: No weakness.  Psychiatric:        Mood and Affect: Mood normal.     ED Results / Procedures / Treatments   Labs (all labs ordered are listed, but only abnormal results are displayed) Labs Reviewed - No data to display  EKG None  Radiology DG Ankle Complete Right  Result Date: 05/17/2023 CLINICAL DATA:  Tripped and fell in front yd. Right ankle pain and swelling. EXAM: RIGHT ANKLE - COMPLETE 3+ VIEW COMPARISON:  None Available. FINDINGS: There is no evidence of fracture, dislocation, or joint effusion. Internal fixation plate and screws are seen in the proximal 1st and 5th metatarsals. No other bone lesions identified. Moderate soft tissue swelling seen overlying the lateral malleolus. IMPRESSION: Lateral soft tissue swelling. No evidence of acute fracture or dislocation. Electronically Signed   By: Danae Orleans M.D.   On: 05/17/2023 12:51   DG Elbow Complete Left  Result Date: 05/17/2023 CLINICAL DATA:  Tripped and fell in front yd  yesterday. Left elbow injury and pain. EXAM: LEFT ELBOW - COMPLETE 3+ VIEW COMPARISON:  None Available. FINDINGS: There is no evidence of fracture, dislocation, or joint effusion. Mild degenerative spurring is seen involving the coronoid process of the ulna. Soft tissues are unremarkable. IMPRESSION: No acute findings. Mild degenerative spurring involving the coronoid process. Electronically Signed   By: Danae Orleans M.D.   On: 05/17/2023 12:46   DG Shoulder Left  Result Date: 05/17/2023 CLINICAL DATA:  Tripped and fell today in front yard. Left shoulder injury and pain. EXAM: LEFT SHOULDER - 2+ VIEW COMPARISON:  None Available. FINDINGS: There is no evidence of fracture or dislocation. There is no evidence of arthropathy or other focal bone abnormality. Soft tissues are unremarkable. IMPRESSION: Negative. Electronically Signed   By: Danae Orleans M.D.   On: 05/17/2023 12:44   CT Lumbar Spine Wo Contrast  Result Date: 05/17/2023 CLINICAL DATA:  Back trauma. No prior imaging. Fall walking to [employer]. EXAM: CT LUMBAR SPINE WITHOUT CONTRAST TECHNIQUE: Multidetector CT imaging of the lumbar spine was performed without intravenous contrast administration. Multiplanar CT image reconstructions were also generated. RADIATION DOSE REDUCTION: This exam was performed according to the departmental dose-optimization program which includes automated exposure control, adjustment of the mA and/or kV according to patient size and/or use of iterative reconstruction technique. COMPARISON:  None Available. FINDINGS: Segmentation: Rudimentary disc space at S1-2 based on the lowest ribs Alignment: No traumatic malalignment Vertebrae: No acute fracture or focal pathologic process. Paraspinal and other soft tissues: Negative. Disc levels: Lumbar spine degeneration primarily affecting L4-5 and L5-S1 facets. The spinal canal appears diffusely patent. IMPRESSION: Negative for fracture or subluxation of the lumbar spine.  Electronically Signed   By: Tiburcio Pea M.D.   On: 05/17/2023 12:35    Procedures Procedures    Medications Ordered in ED Medications  oxyCODONE (Oxy IR/ROXICODONE) immediate release tablet 5 mg (5 mg Oral Given 05/17/23 1203)    ED Course/ Medical Decision Making/ A&P  Medical Decision Making Amount and/or Complexity of Data Reviewed Radiology: ordered.  Risk Prescription drug management.   Status post fall yesterday.  Will get CT lumbar spine.  Will get x-ray of left shoulder left elbow and right ankle.  X-ray of the left shoulder negative x-ray left elbow negative for any acute injury.  X-ray of the right ankle lateral soft tissue swelling no evidence of medial fracture or dislocation.  CT of lumbar spine negative for fracture or subluxation of the lumbar spine.  Will treat patient with a cam walker.  Have her follow-up with orthopedics locally.  Patient preferred being followed locally.   Final Clinical Impression(s) / ED Diagnoses Final diagnoses:  Fall, initial encounter  Sprain of right ankle, unspecified ligament, initial encounter  Lumbar sprain, initial encounter  Left elbow pain  Acute pain of left shoulder  Chronic midline low back pain without sciatica    Rx / DC Orders ED Discharge Orders     None         Vanetta Mulders, MD 05/17/23 1202    Vanetta Mulders, MD 05/17/23 1437

## 2023-06-27 ENCOUNTER — Telehealth: Payer: Self-pay | Admitting: Internal Medicine

## 2023-06-27 NOTE — Telephone Encounter (Signed)
 I called the patient re: her 06/28/23 appt because we don't have authorization for the visit.  The patient was mailed a letter 04/2023 to make her aware that she would need to contact the referring Dr. To get him to submit information 3 weeks prior to her appt to get authorization for her to come to this appt.  I left her a message asking her to please call the office to let us  know if she had contacted the referring dr and if she was planning to keep her appointment.

## 2023-06-27 NOTE — Progress Notes (Deleted)
 GI Office Note    Referring Provider: Vernia Alm HERO, MD Primary Care Physician:  Patient, No Pcp Per Primary Gastroenterologist: Lamar HERO.Rourk, MD  Date:  06/27/2023  ID:  Vicki Dean, DOB 02-10-1961, MRN [id number]   Chief Complaint   No chief complaint on file.   History of Present Illness  Vicki Dean is a 63 y.o. female with a history of *** presenting today with complaint of   EGD March 2019: -Moderate erosive reflux esophagitis s/p dilation   Colonoscopy March 2019: -Grade 2 hemorrhoids -Advised repeat in 10 years   History of  heavy cocaine use and EtOH use for the last 2 days.  She was noted to have rapid speech and excitation as well as excessive movement at one of her prior office visits.   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  She was referred to the ED for evaluation of possible ACS in the setting of hypertensive emergency and drug use.  Patient declined EMS transport, was to go to the ED with her daughter.  GI referral given for GERD, noted to have history of esophageal stricture.   OV 05/29/2022.  Patient reported taking Nexium  once daily but reflux getting worse.  Sleeps with a wedge underneath her mattress.  Did report fatigue and upper abdominal pain.  At times is trying on her saliva and feels acid coming out when laying flat.  Triggers are greasy foods, chocolate, peppermint, and soda.  Pills getting hung at the base of her neck also lots of belching.  Did admit to occasional Aleve  use.  Taking meloxicam  and diclofenac  for pain.  Having bowel movement every 2-4 days and has to strain at times.  Feels as though she has hemorrhoids and rare toilet tissue hematochezia.  Takes Dulcolax to help with bowel movements.  Did admit to occasional alcohol use and marijuana use however denied any tobacco use.  Does report occasional chest pain and no edema.  Scheduled for EGD with dilation.  Advised to stop Nexium  and start  pantoprazole  twice daily and famotidine  as needed for breakthrough.  Advised on GERD diet.  Provided samples of Linzess .  Advised Preparation H for hemorrhoids.  Begin fiber supplementation daily.   EGD 07/05/2022: -Normal esophagus s/p dilation -Small hiatal hernia -Normal duodenum   Labs 08/22/22: Hgb 13.3, MCV 68.8, sodium 142, normal LFTs.   OV 11/16/22. Still fatigue but denies any dizziness or lightheadedness.  No pica.  Taking PPI usually at least once a day, forgets to take it twice a day usually.  Sometimes goes nausea and has dry heaving.  Very little alcohol use.  Still not having bowel movements very often, has had some benefit from Linzess  in the past.  Mild toilet tissue medic easy with straining.  Notices peppermint candies are a bother to her abdominal pain.  Encouraged to take PPI twice daily, famotidine  nightly.  Take Linzess  45 mcg daily.  Daily fiber supplementation and avoid NSAIDs.  Last office visit 03/29/23. ***. Linzess  increased to 290 mcg daily.  Given Carafate  for 2 weeks.  Advised to stop pantoprazole  and start Nexium  40 mg twice daily.  Request prior EGD records from the [place].  GERD diet reinforced.    Today:    Wt Readings from Last 3 Encounters:  05/17/23 187 lb (84.8 kg)  03/29/23 171 lb 6.4 oz (77.7 kg)  11/16/22 177 lb 12.8 oz (80.6 kg)    Current Outpatient Medications  Medication Sig Dispense  Refill   albuterol  (PROVENTIL  HFA;VENTOLIN  HFA) 108 (90 Base) MCG/ACT inhaler Inhale 1-2 puffs into the lungs every 6 (six) hours as needed for wheezing or shortness of breath.     amLODipine  (NORVASC ) 10 MG tablet 1 tab(s) orally once a day for 30 day(s)     ammonium lactate  (AMLACTIN) 12 % lotion Apply 1 Application topically as needed for dry skin. 400 g 0   benzonatate  (TESSALON ) 100 MG capsule Take 1 capsule (100 mg total) by mouth every 8 (eight) hours. 21 capsule 0   budesonide-formoterol  (SYMBICORT) 160-4.5 MCG/ACT inhaler Inhale 2 puffs into the lungs 2  (two) times daily as needed (Shortness of breath).     diclofenac  (VOLTAREN ) 75 MG EC tablet Take 1 tablet (75 mg total) by mouth 2 (two) times daily. 20 tablet 0   esomeprazole  (NEXIUM ) 40 MG capsule Take 1 capsule (40 mg total) by mouth 2 (two) times daily before a meal. 60 capsule 5   hydrochlorothiazide  (HYDRODIURIL ) 12.5 MG tablet Take 1 tablet (12.5 mg total) by mouth daily. 30 tablet 0   ibuprofen  (ADVIL ) 800 MG tablet Take 1 tablet (800 mg total) by mouth every 6 (six) hours as needed. 60 tablet 1   linaclotide  (LINZESS ) 290 MCG CAPS capsule Take 1 capsule (290 mcg total) by mouth daily before breakfast. 30 capsule 5   lurasidone  (LATUDA ) 20 MG TABS tablet TAKE ONE TABLET BY MOUTH EVERY EVENING (TAKE WITH EVENING MEAL)     meloxicam  (MOBIC ) 15 MG tablet Take 1 tablet by mouth daily.     Multiple Vitamins-Minerals (MULTIVITAMIN WITH MINERALS) tablet Take 1 tablet by mouth daily.     potassium chloride  SA (KLOR-CON  M) 20 MEQ tablet Take 2 tablets (40 mEq total) by mouth daily. 6 tablet 0   sodium chloride  (OCEAN) 0.65 % SOLN nasal spray Place 1 spray into both nostrils as needed for congestion.     sucralfate  (CARAFATE ) 1 GM/10ML suspension Take 10 mLs (1 g total) by mouth 4 (four) times daily -  with meals and at bedtime for 14 days. 560 mL 0   traZODone  (DESYREL ) 100 MG tablet TAKE 1 TO 2 TABLET(S) BY MOUTH EVERY NIGHT AT BEDTIME     venlafaxine  XR (EFFEXOR -XR) 150 MG 24 hr capsule TAKE ONE CAPSULE BY MOUTH EVERY MORNING FOR MOOD     No current facility-administered medications for this visit.    Past Medical History:  Diagnosis Date   Anxiety    Arthritis    possibly her right shoulder.   Chronic back pain    COPD (chronic obstructive pulmonary disease) (HCC)    Fibromyalgia    GERD (gastroesophageal reflux disease)    Hypertension    Sleep apnea    could not tolerate    Past Surgical History:  Procedure Laterality Date   ABDOMINAL HYSTERECTOMY     CARPAL TUNNEL RELEASE  Left    CHOLECYSTECTOMY N/A 02/25/2021   Procedure: LAPAROSCOPIC CHOLECYSTECTOMY;  Surgeon: Mavis Anes, MD;  Location: [facility];  Service: General;  Laterality: N/A;   COLONOSCOPY WITH PROPOFOL  N/A 08/23/2017   Dr. Shaaron, grade II hemorrhoids. next TCS in 10 years.    ESOPHAGOGASTRODUODENOSCOPY (EGD) WITH PROPOFOL  N/A 08/23/2017   Dr. Shaaron: mild erosive reflux esophagitis s/p dilation due to h/o dysphagia.    ESOPHAGOGASTRODUODENOSCOPY (EGD) WITH PROPOFOL  N/A 07/05/2022   Procedure: ESOPHAGOGASTRODUODENOSCOPY (EGD) WITH PROPOFOL ;  Surgeon: Shaaron Lamar HERO, MD;  Location: [facility];  Service: Endoscopy;  Laterality: N/A;  7:30 am  FOOT SURGERY Right    screws from fracture   GANGLION CYST EXCISION Right    foot   KNEE SURGERY Right    arthroscopy   MALONEY DILATION N/A 08/23/2017   Procedure: AGAPITO DILATION;  Surgeon: Shaaron Lamar HERO, MD;  Location: [facility];  Service: Endoscopy;  Laterality: N/A;   MALONEY DILATION N/A 07/05/2022   Procedure: AGAPITO DILATION;  Surgeon: Shaaron Lamar HERO, MD;  Location: [facility];  Service: Endoscopy;  Laterality: N/A;    Family History  Problem Relation Age of Onset   Hypertension Mother    Diabetes Mother    Arthritis Mother    Hypercholesterolemia Mother    Emphysema Father    Cancer Other    Diabetes Other    Colon cancer Neg Hx    Gastric cancer Neg Hx    Esophageal cancer Neg Hx     Allergies as of 06/28/2023 - Review Complete 05/17/2023  Allergen Reaction Noted   Doxepin  07/31/2017   Lisinopril  06/28/2017   Omeprazole   06/28/2017   Pneumococcal vaccines  07/31/2017    Social History   Socioeconomic History   Marital status: Single    Spouse name: Not on file   Number of children: Not on file   Years of education: Not on file   Highest education level: Not on file  Occupational History   Not on file  Tobacco Use   Smoking status: Former    Current packs/day: 0.00    Average packs/day: 1 pack/day for 20.0 years  (20.0 ttl pk-yrs)    Types: Cigarettes    Start date: 08/20/1993    Quit date: 08/20/2013    Years since quitting: 9.8   Smokeless tobacco: Never  Vaping Use   Vaping status: Never Used  Substance and Sexual Activity   Alcohol use: Not Currently    Comment: in the past   Drug use: Not Currently    Types: Marijuana    Comment: denied 05/05/19   Sexual activity: Yes    Birth control/protection: Surgical  Other Topics Concern   Not on file  Social History Narrative   Not on file   Social Drivers of Health   Financial Resource Strain: Not on file  Food Insecurity: Not on file  Transportation Needs: Not on file  Physical Activity: Not on file  Stress: Not on file  Social Connections: Not on file     Review of Systems   Gen: Denies fever, chills, anorexia. Denies fatigue, weakness, weight loss.  CV: Denies chest pain, palpitations, syncope, peripheral edema, and claudication. Resp: Denies dyspnea at rest, cough, wheezing, coughing up blood, and pleurisy. GI: See HPI Derm: Denies rash, itching, dry skin Psych: Denies depression, anxiety, memory loss, confusion. No homicidal or suicidal ideation.  Heme: Denies bruising, bleeding, and enlarged lymph nodes.  Physical Exam   There were no vitals taken for this visit.  General:   Alert and oriented. No distress noted. Pleasant and cooperative.  Head:  Normocephalic and atraumatic. Eyes:  Conjuctiva clear without scleral icterus. Mouth:  Oral mucosa pink and moist. Good dentition. No lesions. Lungs:  Clear to auscultation bilaterally. No wheezes, rales, or rhonchi. No distress.  Heart:  S1, S2 present without murmurs appreciated.  Abdomen:  +BS, soft, non-tender and non-distended. No rebound or guarding. No HSM or masses noted. Rectal: *** Msk:  Symmetrical without gross deformities. Normal posture. Extremities:  Without edema. Neurologic:  Alert and  oriented x4 Psych:  Alert and  cooperative. Normal mood and  affect.  Assessment  Vicki Dean is a 63 y.o. female with a history of *** presenting today with   GERD, epigastric pain:  Constipation:  PLAN   ***     Charmaine Melia, MSN, FNP-BC, AGACNP-BC [facility]

## 2023-06-28 ENCOUNTER — Ambulatory Visit: Payer: No Typology Code available for payment source | Admitting: Gastroenterology

## 2023-07-01 IMAGING — CT CT ABD-PELV W/ CM
2 of 5 series · 17 of 46 positions shown, 19 images · IV contrast (Omnipaque or Isovue)
Comparison: 09/04/2020

CLINICAL DATA: Right lower quadrant pain

EXAM:
CT ABDOMEN AND PELVIS WITH CONTRAST
TECHNIQUE: Multidetector CT imaging of the abdomen and pelvis was performed
using the standard protocol following bolus administration of
intravenous contrast.
CONTRAST:  80mL OMNIPAQUE IOHEXOL 350 MG/ML SOLN

[Series 2: axial st · axial · 0.73mm/px · z∈[+857,+1282]mm · 14 of 97 slices shown, 16 images]
[im 6/97  soft-tissue]
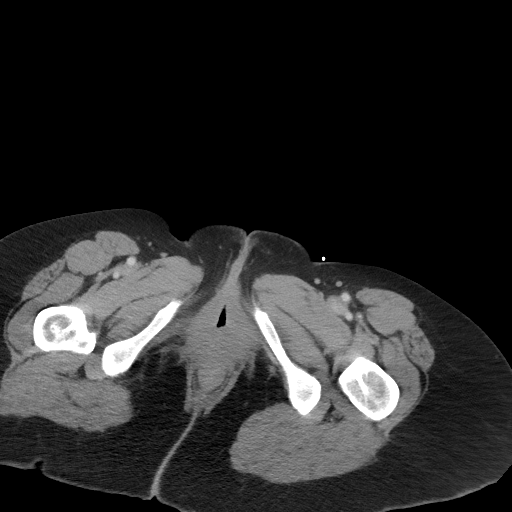
[im 6/97  bone]
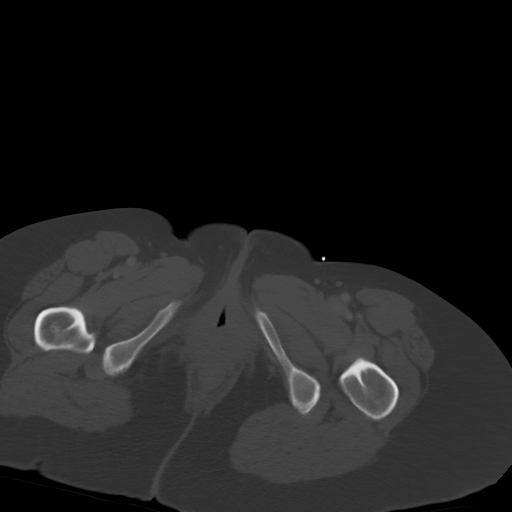
[im 11/97  soft-tissue]
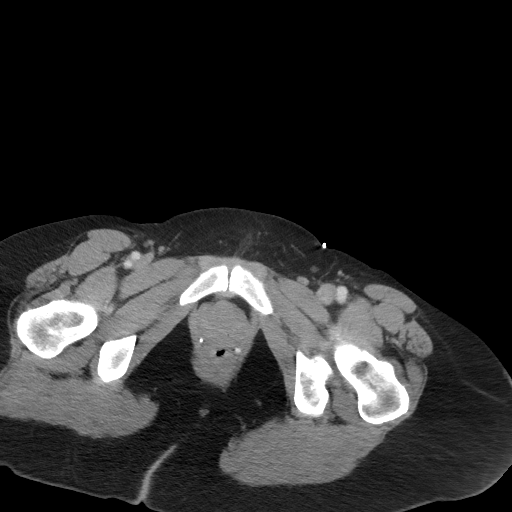
[im 22/97  soft-tissue]
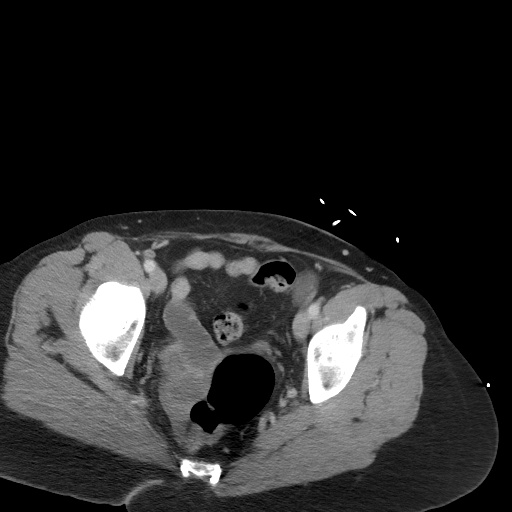
[im 27/97  soft-tissue]
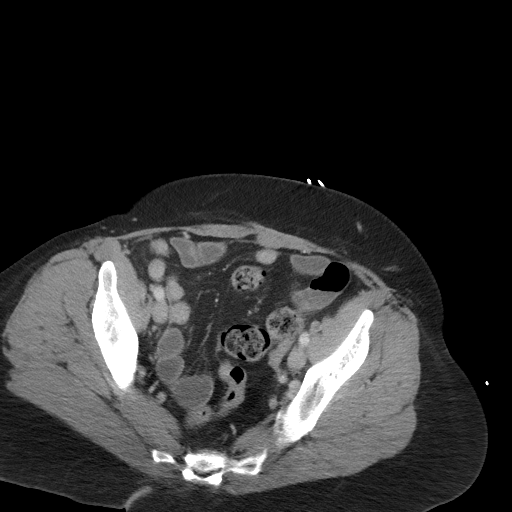
[im 33/97  soft-tissue]
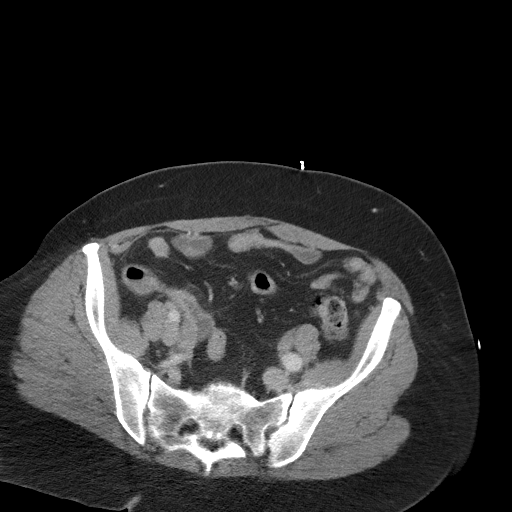
[im 38/97  soft-tissue]
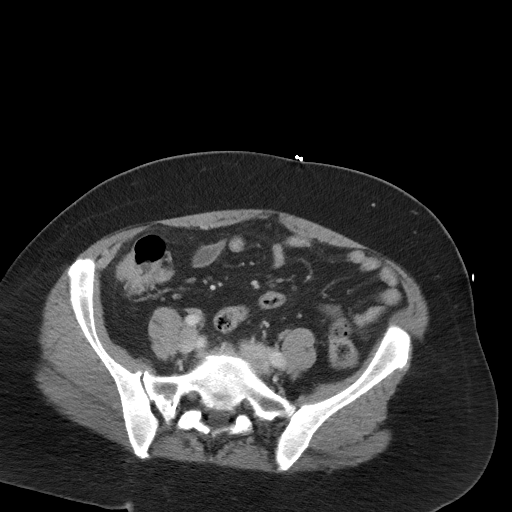
[im 43/97  soft-tissue]
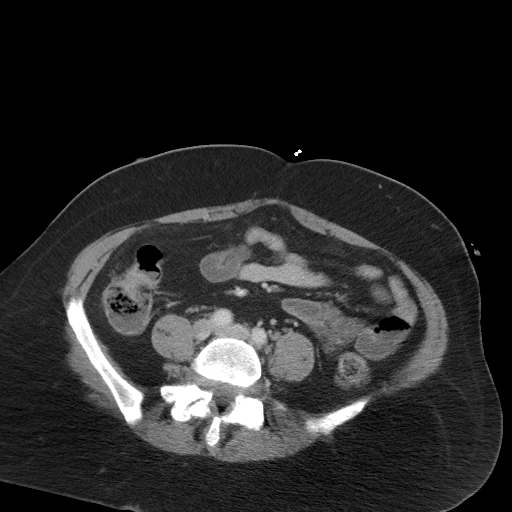
[im 54/97  soft-tissue]
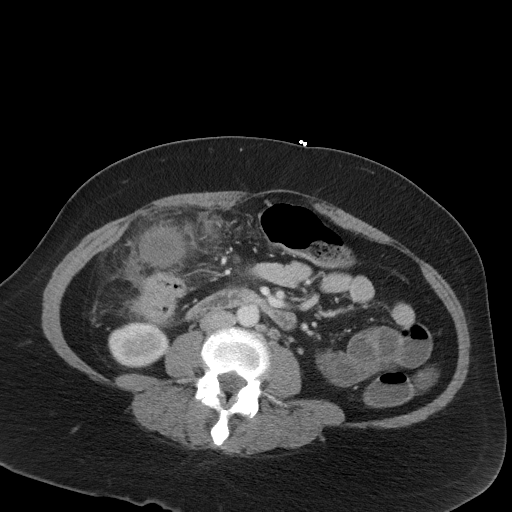
[im 59/97  soft-tissue]
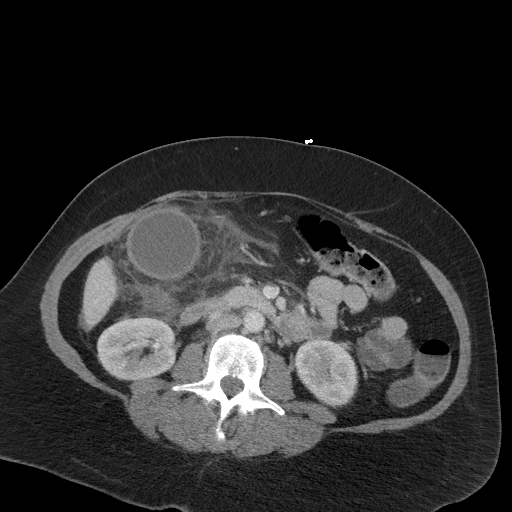
[im 59/97  bone]
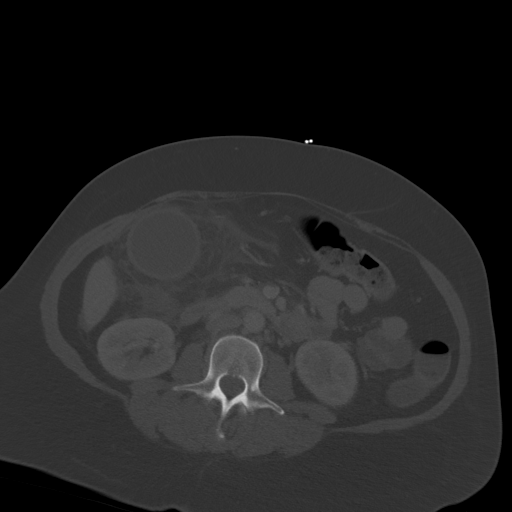
[im 65/97  soft-tissue]
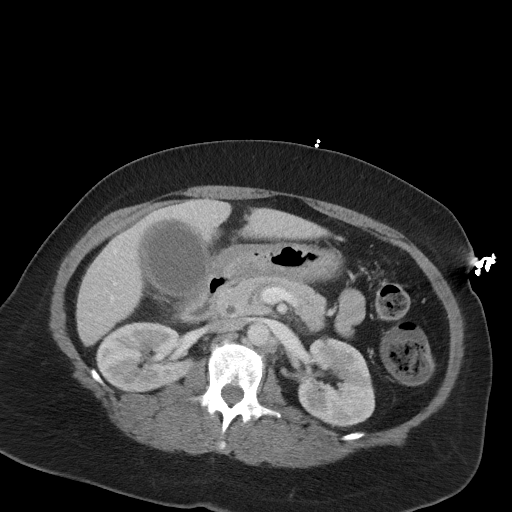
[im 70/97  soft-tissue]
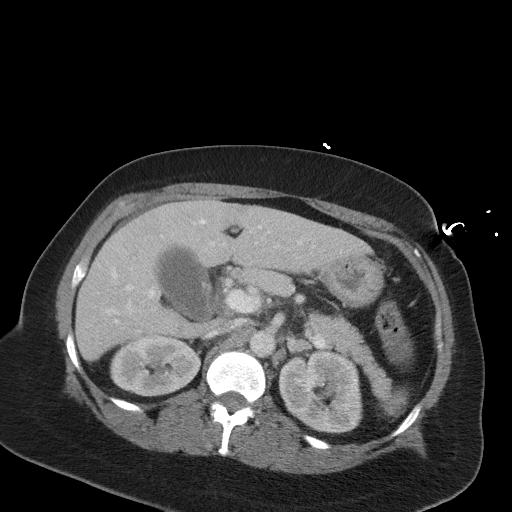
[im 75/97  soft-tissue]
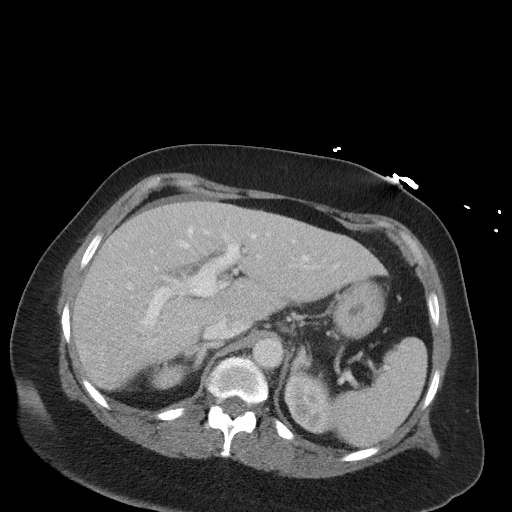
[im 86/97  soft-tissue]
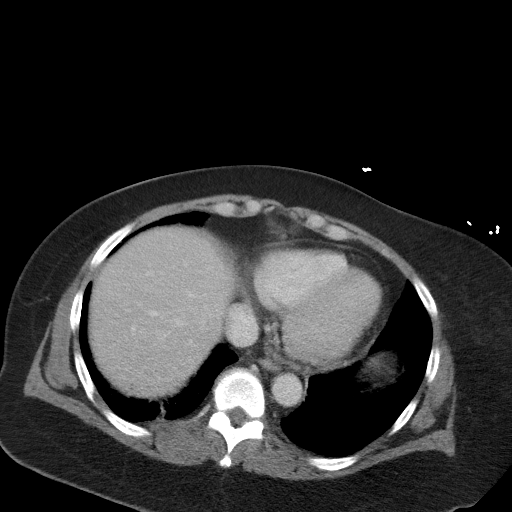
[im 91/97  soft-tissue]
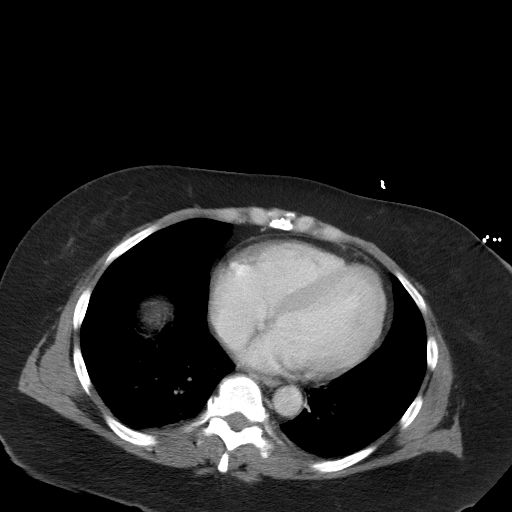

[Series 5: coronal st · coronal · 0.84mm/px · 3 of 88 slices shown]
[im 30/88  soft-tissue]
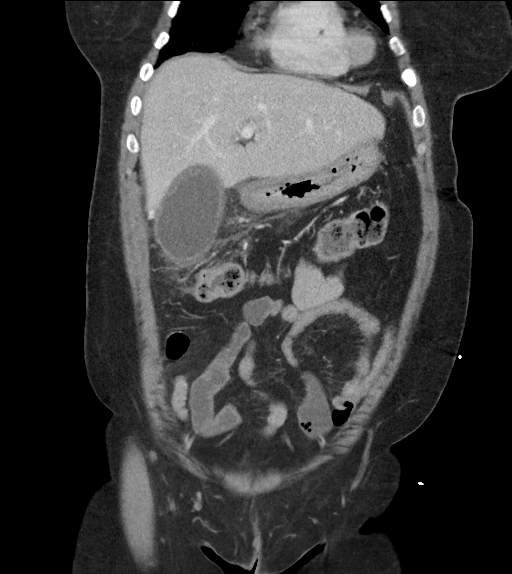
[im 39/88  soft-tissue]
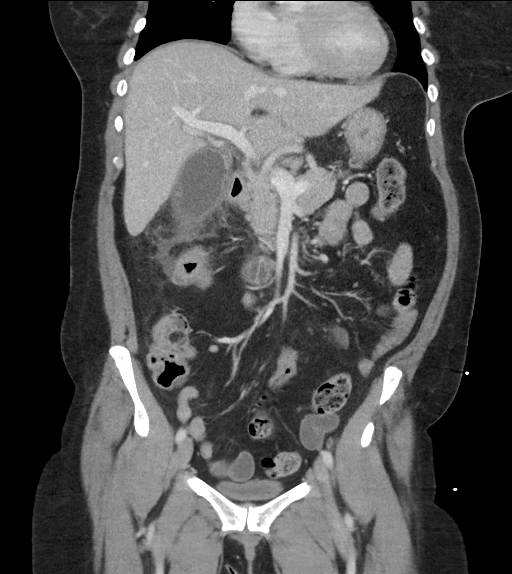
[im 49/88  soft-tissue]
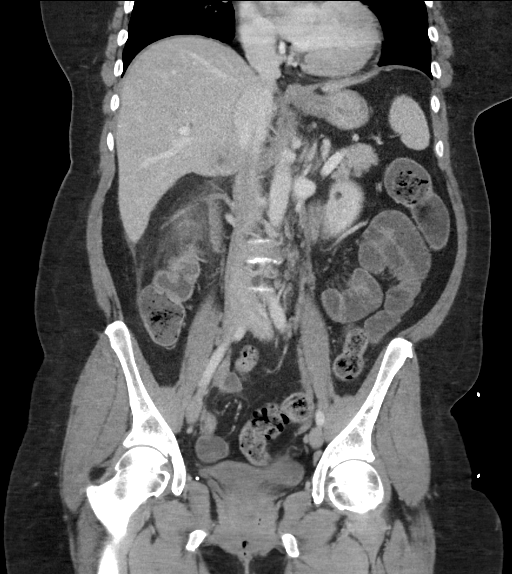

[17 of 46 positions shown; findings below may reference images not displayed]

FINDINGS: Lower chest: Linear scarring or atelectasis in the lung bases. No
effusions.

Hepatobiliary: Gallbladder is distended with thick walls,
pericholecystic fluid and stranding compatible with cholecystitis.
Small hypodensity within the liver likely reflect small cyst. No
suspicious focal hepatic abnormality.

Pancreas: No focal abnormality or ductal dilatation.

Spleen: No focal abnormality.  Normal size.

Adrenals/Urinary Tract: Small scattered cortical cysts. No
hydronephrosis. Adrenal glands and urinary bladder unremarkable.

Stomach/Bowel: Stomach, large and small bowel grossly unremarkable.

Vascular/Lymphatic: No evidence of aneurysm or adenopathy.

Reproductive: Prior hysterectomy.  No adnexal masses.

Other: No free fluid or free air.

Musculoskeletal: No acute bony abnormality.
IMPRESSION: Distended gallbladder with wall thickening, pericholecystic fluid
and significant surrounding stranding compatible with acute
cholecystitis.

## 2023-07-01 IMAGING — US US ABDOMEN LIMITED
1 series · 14 of 25 positions shown · non-contrast
Comparison: Same day CT abdomen/pelvis

CLINICAL DATA: Right upper quadrant pain, emesis

EXAM:
ULTRASOUND ABDOMEN LIMITED RIGHT UPPER QUADRANT

[Series 1: us abdomen limited ruq (liver/gb) · 14 of 89 slices shown]
[im 1/89]
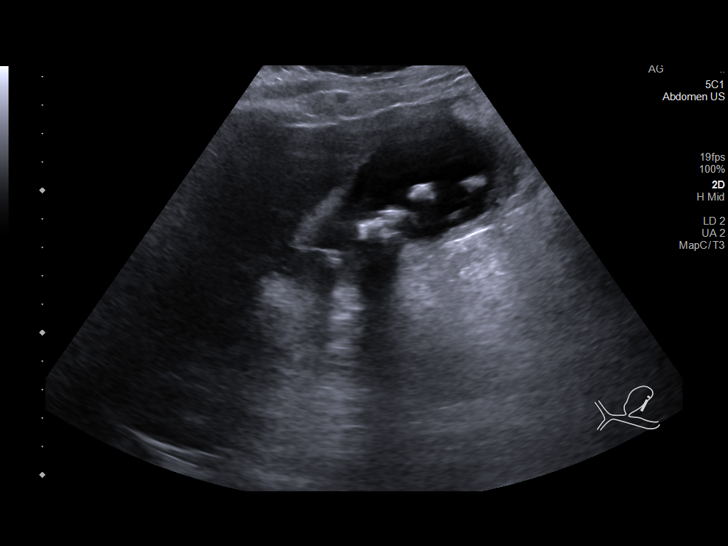
[im 8/89]
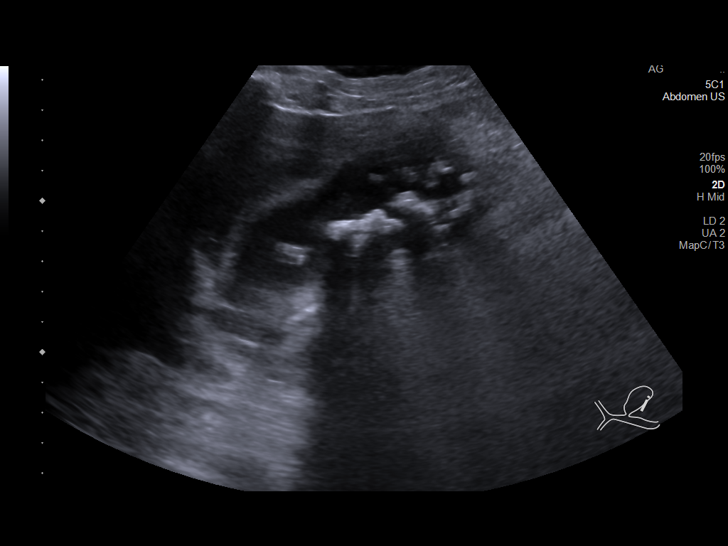
[im 15/89]
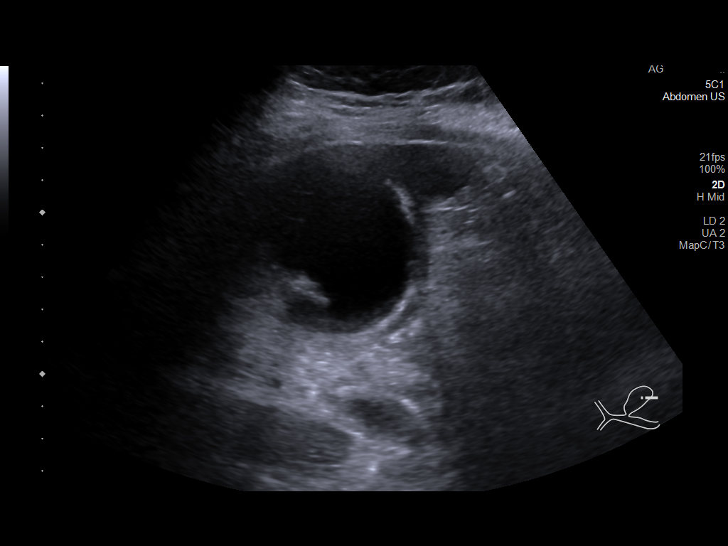
[im 23/89]
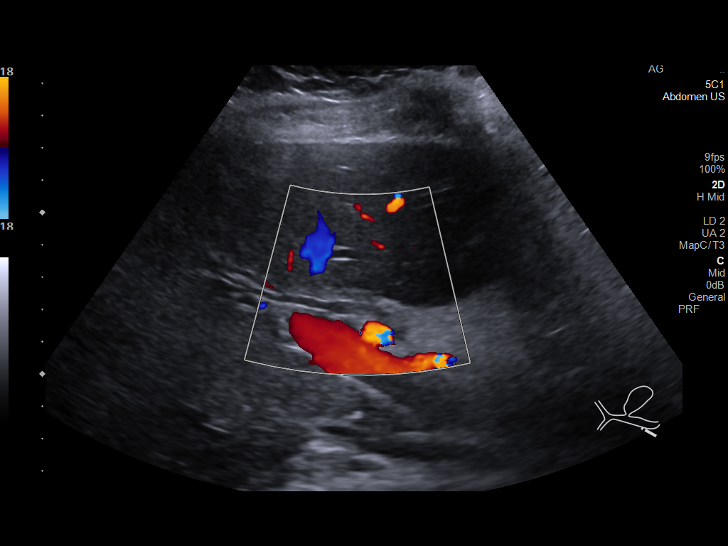
[im 30/89]
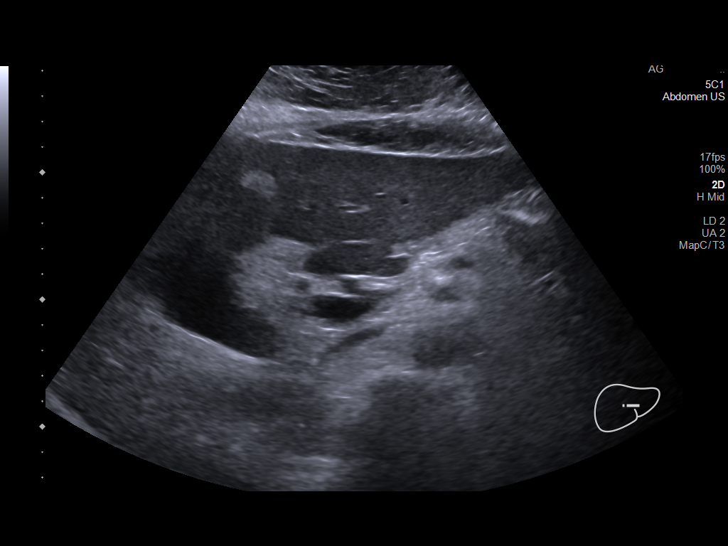
[im 34/89]
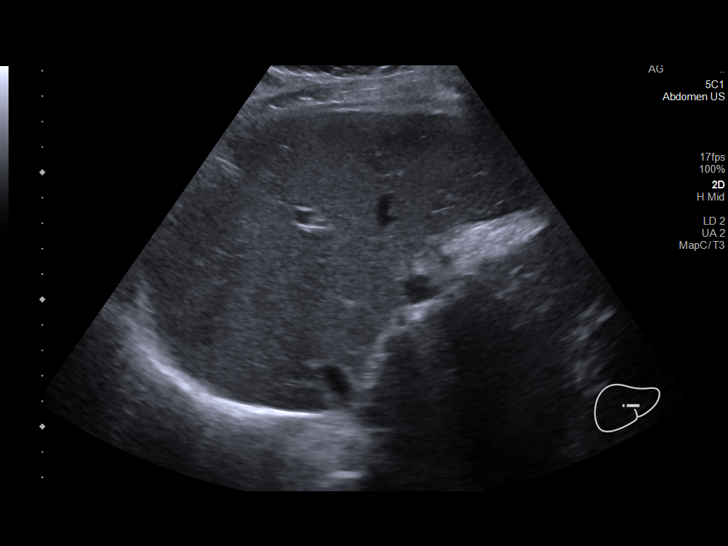
[im 41/89]
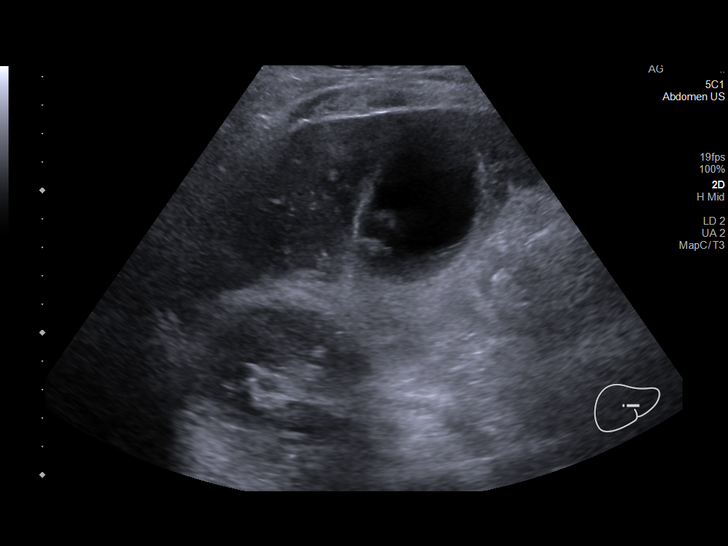
[im 48/89]
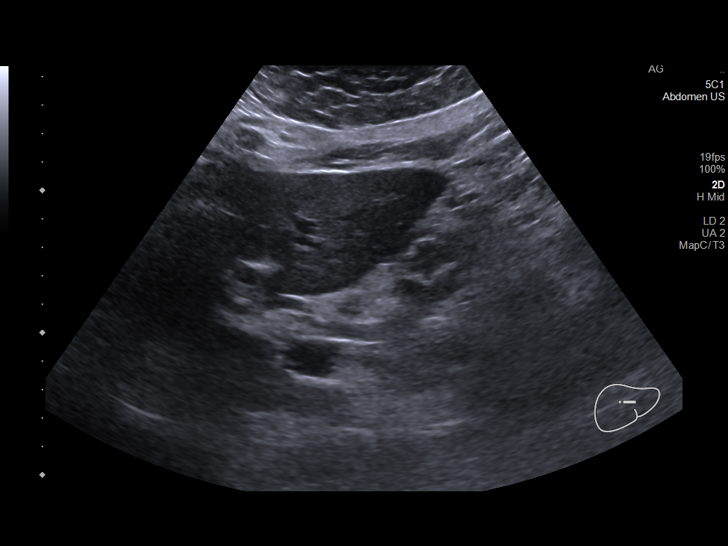
[im 56/89]
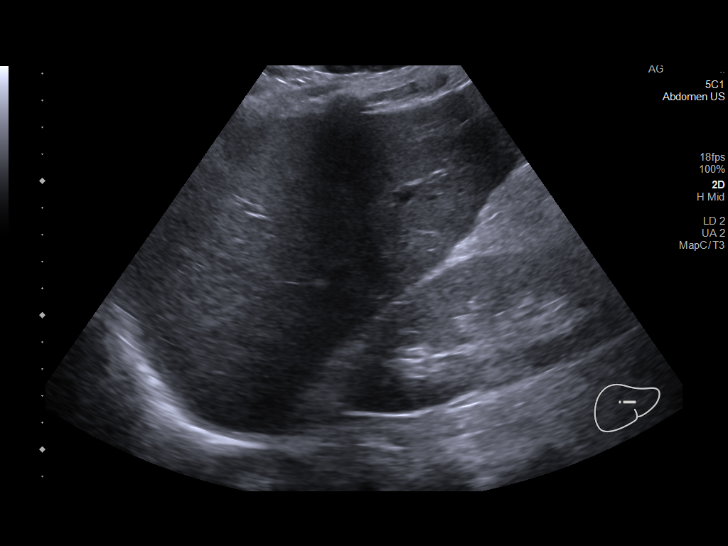
[im 59/89]
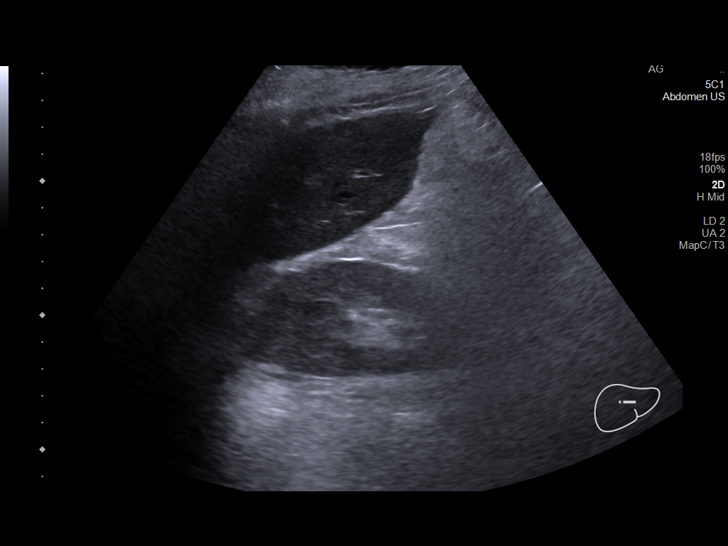
[im 67/89]
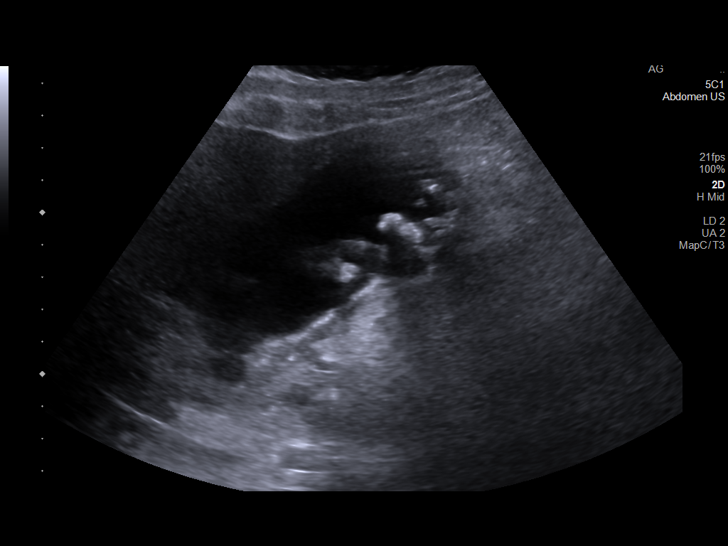
[im 74/89]
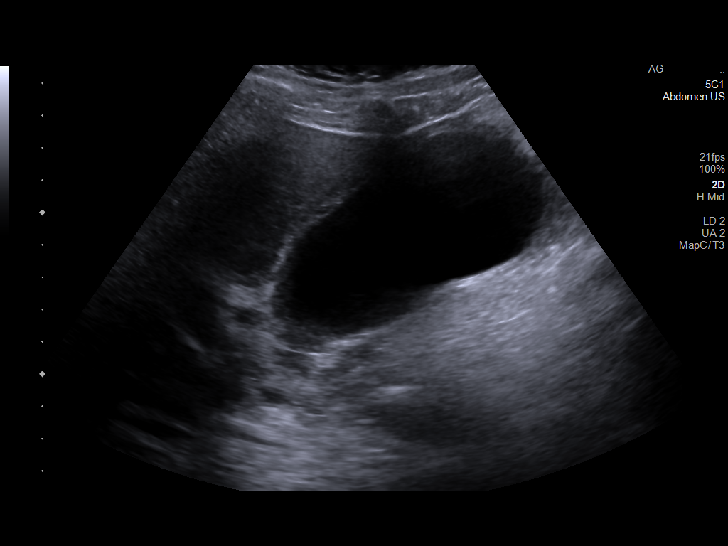
[im 81/89]
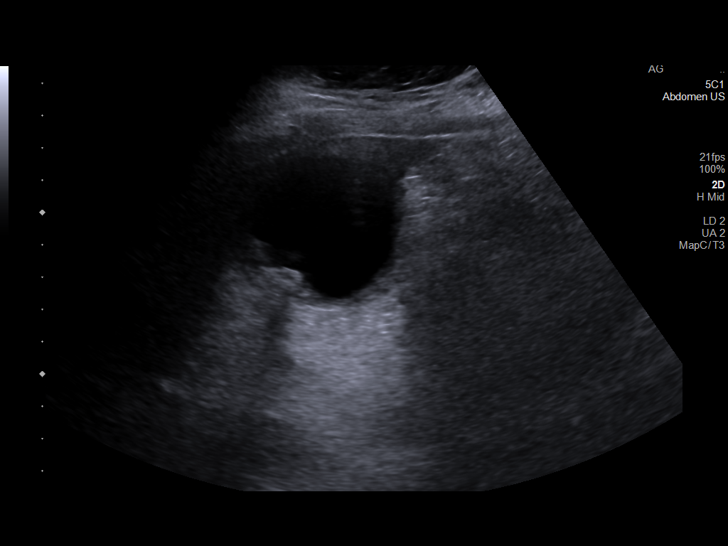
[im 89/89]
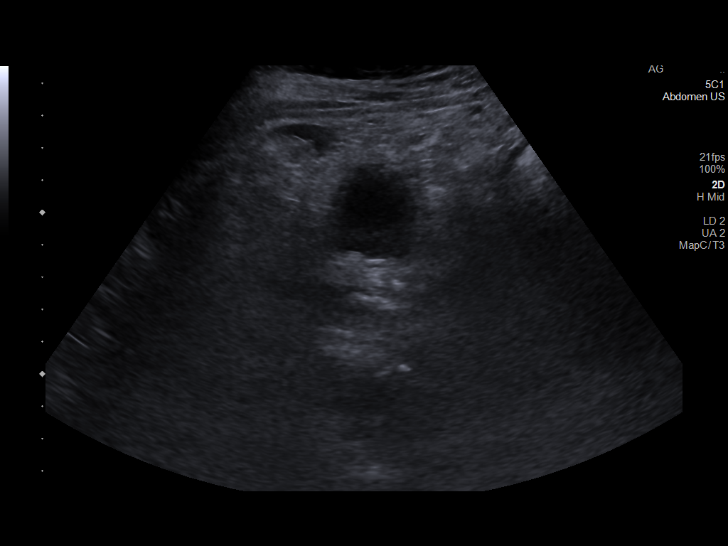

[14 of 25 positions shown; findings below may reference images not displayed]

FINDINGS: Gallbladder:

There are multiple shadowing stones in the gallbladder. There is a
small amount of pericholecystic fluid. The gallbladder wall is
thickened measuring up to 9 mm. A positive sonographic Murphy's sign
was reported by the sonographer.

Common bile duct:

Diameter: 4 mm

Liver:

No focal lesion identified. Within normal limits in parenchymal
echogenicity. Portal vein is patent on color Doppler imaging with
normal direction of blood flow towards the liver.

Other: There is trace perihepatic fluid.
IMPRESSION: Findings above consistent with acute cholecystitis.

## 2023-11-21 ENCOUNTER — Ambulatory Visit: Admitting: Gastroenterology

## 2023-11-22 ENCOUNTER — Encounter: Payer: Self-pay | Admitting: Gastroenterology

## 2024-01-11 ENCOUNTER — Ambulatory Visit: Admitting: Podiatry

## 2024-01-28 ENCOUNTER — Emergency Department (HOSPITAL_COMMUNITY)

## 2024-01-28 ENCOUNTER — Emergency Department (HOSPITAL_COMMUNITY)
Admission: EM | Admit: 2024-01-28 | Discharge: 2024-01-28 | Disposition: A | Discharge status: Home / Self Care | Attending: Emergency Medicine | Admitting: Emergency Medicine

## 2024-01-28 ENCOUNTER — Other Ambulatory Visit: Payer: Self-pay

## 2024-01-28 ENCOUNTER — Encounter (HOSPITAL_COMMUNITY): Payer: Self-pay

## 2024-01-28 DIAGNOSIS — R519 Headache, unspecified: Secondary | ICD-10-CM | POA: Diagnosis not present

## 2024-01-28 DIAGNOSIS — R0789 Other chest pain: Secondary | ICD-10-CM | POA: Diagnosis not present

## 2024-01-28 DIAGNOSIS — S161XXA Strain of muscle, fascia and tendon at neck level, initial encounter: Secondary | ICD-10-CM | POA: Diagnosis not present

## 2024-01-28 DIAGNOSIS — M546 Pain in thoracic spine: Secondary | ICD-10-CM | POA: Diagnosis not present

## 2024-01-28 DIAGNOSIS — S199XXA Unspecified injury of neck, initial encounter: Secondary | ICD-10-CM | POA: Diagnosis present

## 2024-01-28 DIAGNOSIS — Z79899 Other long term (current) drug therapy: Secondary | ICD-10-CM | POA: Insufficient documentation

## 2024-01-28 DIAGNOSIS — M545 Low back pain, unspecified: Secondary | ICD-10-CM | POA: Insufficient documentation

## 2024-01-28 DIAGNOSIS — J449 Chronic obstructive pulmonary disease, unspecified: Secondary | ICD-10-CM | POA: Insufficient documentation

## 2024-01-28 DIAGNOSIS — I10 Essential (primary) hypertension: Secondary | ICD-10-CM | POA: Insufficient documentation

## 2024-01-28 DIAGNOSIS — Y92410 Unspecified street and highway as the place of occurrence of the external cause: Secondary | ICD-10-CM | POA: Insufficient documentation

## 2024-01-28 MED ORDER — KETOROLAC TROMETHAMINE 15 MG/ML IJ SOLN
15.0000 mg | Freq: Once | INTRAMUSCULAR | Status: AC
  Administered 2024-01-28 (×2): 15 mg via INTRAMUSCULAR
  Filled 2024-01-28: qty 1

## 2024-01-28 MED ORDER — METHOCARBAMOL 500 MG PO TABS: 500.0000 mg | ORAL_TABLET | Freq: Three times a day (TID) | ORAL | 0 refills | Status: AC | PRN
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
## 2024-03-11 ENCOUNTER — Encounter: Payer: Self-pay | Admitting: Gastroenterology
